# Patient Record
Sex: Male | Born: 1935 | Race: White | Hispanic: No | Smoking: Former smoker
Health system: Southern US, Community
[De-identification: ages and names within clinical notes are randomized; demographics above are authoritative.]

## PROBLEM LIST (undated history)

## (undated) DIAGNOSIS — I4891 Unspecified atrial fibrillation: Secondary | ICD-10-CM

## (undated) DIAGNOSIS — I350 Nonrheumatic aortic (valve) stenosis: Secondary | ICD-10-CM

## (undated) DIAGNOSIS — I495 Sick sinus syndrome: Secondary | ICD-10-CM

## (undated) DIAGNOSIS — R251 Tremor, unspecified: Secondary | ICD-10-CM

## (undated) DIAGNOSIS — I509 Heart failure, unspecified: Secondary | ICD-10-CM

## (undated) DIAGNOSIS — Z950 Presence of cardiac pacemaker: Secondary | ICD-10-CM

## (undated) DIAGNOSIS — Z952 Presence of prosthetic heart valve: Secondary | ICD-10-CM

## (undated) DIAGNOSIS — E119 Type 2 diabetes mellitus without complications: Secondary | ICD-10-CM

## (undated) DIAGNOSIS — I10 Essential (primary) hypertension: Secondary | ICD-10-CM

## (undated) DIAGNOSIS — C801 Malignant (primary) neoplasm, unspecified: Secondary | ICD-10-CM

## (undated) HISTORY — PX: COLONOSCOPY: SHX174

## (undated) HISTORY — DX: Unspecified atrial fibrillation: I48.91

## (undated) HISTORY — DX: Sick sinus syndrome: I49.5

## (undated) HISTORY — PX: HERNIA REPAIR: SHX51

## (undated) HISTORY — PX: INSERT / REPLACE / REMOVE PACEMAKER: SUR710

## (undated) HISTORY — DX: Nonrheumatic aortic (valve) stenosis: I35.0

## (undated) HISTORY — PX: TONSILLECTOMY: SUR1361

---

## 1991-07-11 HISTORY — PX: PACEMAKER INSERTION: SHX728

## 1999-11-04 ENCOUNTER — Ambulatory Visit (HOSPITAL_COMMUNITY): Admission: RE | Admit: 1999-11-04 | Discharge: 1999-11-04 | Payer: Self-pay | Admitting: Gastroenterology

## 2004-11-07 HISTORY — PX: BIV PACEMAKER GENERATOR CHANGE OUT: SHX5746

## 2004-11-21 ENCOUNTER — Ambulatory Visit (HOSPITAL_COMMUNITY): Admission: AD | Admit: 2004-11-21 | Discharge: 2004-11-22 | Payer: Self-pay | Admitting: Cardiovascular Disease

## 2008-02-18 ENCOUNTER — Ambulatory Visit (HOSPITAL_COMMUNITY): Admission: RE | Admit: 2008-02-18 | Discharge: 2008-02-18 | Payer: Self-pay | Admitting: Surgery

## 2008-06-10 ENCOUNTER — Encounter: Admission: RE | Admit: 2008-06-10 | Discharge: 2008-06-10 | Payer: Self-pay | Admitting: Cardiovascular Disease

## 2008-06-23 ENCOUNTER — Encounter: Admission: RE | Admit: 2008-06-23 | Discharge: 2008-06-23 | Payer: Self-pay | Admitting: Cardiovascular Disease

## 2008-07-21 ENCOUNTER — Encounter: Admission: RE | Admit: 2008-07-21 | Discharge: 2008-07-21 | Payer: Self-pay | Admitting: Cardiovascular Disease

## 2008-11-20 ENCOUNTER — Encounter: Admission: RE | Admit: 2008-11-20 | Discharge: 2008-11-20 | Payer: Self-pay | Admitting: Gastroenterology

## 2009-10-01 ENCOUNTER — Encounter: Admission: RE | Admit: 2009-10-01 | Discharge: 2009-10-01 | Payer: Self-pay | Admitting: Gastroenterology

## 2010-11-22 NOTE — Op Note (Signed)
NAME:  [name] [name], [name] [name] NO.:  [id number]   MEDICAL RECORD NO.:  [id number]          PATIENT TYPE:  AMB   LOCATION:  DAY                          FACILITY:  [facility]   PHYSICIAN:  Adin Hector, MD     DATE OF BIRTH:  10-16-1926   DATE OF PROCEDURE:  DATE OF DISCHARGE:                               OPERATIVE REPORT   PRIMARY CARE PHYSICIAN:  Dr. Delena Bali in [place], [place].   CARDIOLOGIST:  Richard A. Rollene Fare, M.D.   SURGEON:  Adin Hector, MD.   PREOPERATIVE DIAGNOSES:  1. Incarcerated left inguinal hernia.  2. Possible right inguinal hernia.   POSTOPERATIVE DIAGNOSES:  1. Incarcerated indirect inguinal on the left.  2. Right indirect inguinal hernia.  3. Bilateral femoral hernias.   PROCEDURES PERFORMED:  1. Laparoscopic preperitoneal lysis of adhesions x1 hour.  2. Laparoscopic reduction and repair of left inguinal and femoral      hernias.  3. Laparoscopic repair of right inguinal and femoral hernias.   ANESTHESIA:  1. General anesthesia.  2. Local anesthetic in a field block around all port sites.  3. Bilateral ilioinguinal/genitofemoral/cord nerve blocks.   SPECIMENS:  None.   DRAINS:  None.   ESTIMATED BLOOD LOSS:  50 mL.   COMPLICATIONS:  No major complications.   INDICATIONS:  Mr. Kunselman is a 77 year old male who has a history of an  open left femoral hernia back in 1998.  He has had increasing pain and  swelling in his left groin.  He had a large hernia and that has gotten  stuck down in his scrotum and has not been able to be reduced.  He is  rather physically active and in relatively decent health except for  chronic anticoagulation for rapid heart rate and atrial fibrillation.   Pathophysiology of inguinal herniation versus the risks of strangulation  and further incarceration were discussed.  Options discussed and  recommendations made for laparoscopic bilateral preperitoneal  exploration with repair of left and possible  right inguinal hernias.  Possibly need for conversion to open and other risks were discussed.  Risks, benefits, and alternatives discussed.  Questions answered and he  agreed to proceed.   OPERATIVE FINDINGS:  He had a large incarcerated left inguinal hernia  including a sigmoid colon going down into his scrotum.  He had a small  indirect inguinal hernia.  He had bilateral femoral hernias as well.  They were not incarcerated.   DESCRIPTION OF PROCEDURE:  Informed consent was confirmed.  He received  IV antibiotics just prior to surgery.  He had sequential compression  devices active during the entire case.  He was positioned supine with  both arms tucked.  He underwent general esthesia without any difficulty.  He had a Foley catheter sterilely placed.  His abdomen and scrotum were  clipped, prepped and draped in a sterile fashion.   Entry was gained in the preperitoneal space through an infraumbilical  curvilinear incision.  A nick was made in the anterior rectus fascia  just to the right of midline a sound port was placed in the  preperitoneal plane posterior to the right rectus abdominis muscle.   Capnopreperitoneum to 15 mmHg provided good entry and abdominal wall  insufflation.  Camera dissection was used to free the peritoneum off of  the bilateral lower quadrants and flanks.  Enough work space was created  such that a 5 mm port could be placed in the right midabdomen and left  midabdomen.   Attention was turned to the left side, the more obvious side.  I was  able to partially reduced the hernia after given general anesthesia but  it was unable to be complete reduced.  Careful dissection was done to  free the perineum from lateral sidewalls.  The anterolateral left side  of the bladder was freed off the true pelvic wall.  This helped define  the inguinal region.  Peritoneum could see crawling into a very large  internal ring consistent with an incarcerated indirect hernia.   Careful  dissection done was using blunt as well as sharp dissection.  I was able  to partially reduce some of the sac but was unable to completely reduce  it.  I therefore opened the sac anteriorly where it was thin.  He had a  sigmoid going obviously up into the region.  I ended up doing sharp  dissection of the sigmoid colon off the hernia sac and gradually freed  off the anterior three quarters of the sac around circumferentially and  internal ring.  Eventually, I was able to manually help reduce in the  hernia after partially reducing the sigmoid in until it was fully in.  Sharp dissection was done to free the colon off the hernia sac and  reduce it back into the intra-abdominal space.  The peritoneal sac was  peeled off the intact spermatic vessels and vas deferens as proximally  as possible.  Defect in the perineum was closed using a 3-0 Vicryl  stitch using a running stitch and using laparoscopic intracoronal  suturing to good effect.  There was some oozing, good hemostasis was  assured.  Copious irrigation was done with the more clear return.   On inspection, I also could see a femoral hernia but it was not  incarcerated.  It was much smaller.  There was no definite direct  defect, although there was some laxity in this region.   Attention was turned toward the right side.  Dissection was carried out  in a mirror image fashion.  He had a definite hernia sac on the right  side, but was much smaller and was able to be more easily reduced.   A 15 x 15 cm ultra lightweight polypropylene (Ultrapro) mesh was used  for each side.  Each mesh was into a half-skull shape.  It was  positioned such that a medial and inferior flap rested in the true  pelvis between the anterolateral bladder and anteromedial pelvic wall  covering around the level of the obturator foramen.  Mesh covered well.  The internal ring and femoral rings as well as any potential direct  weaknesses as well.  There was  at least 3 inches of circumferential  coverage around these regions.  The hernia sacs were grasped on both  sides and elevated anteriorly and proximally as capnopreperitoneum was  evacuated.  Ports were removed.  I made an opening in the peritoneum  infraumbilically and evacuated some excess gas.  The infraumbilical  fascial was closed using 0 Vicryl stitch.  Skin was closed using 4-0  Monocryl stitch.  Sterile dressings  applied.  The patient was extubated  and taken to the recovery room in stable condition.   I discussed postoperative recovery with the patient just prior to  surgery and confirmed it with his family.  He is to go follow-up with  Dr. Delena Bali in 2 days after restarting Coumadin for INR checks to  gradually re-anticoagulate himself on the oral Coumadin only.  There was  some concern about possibly considered using Lovenox as a bridge but Dr.  Delena Bali, at this time, felt it would be reasonable to just do Coumadin  at this time and if there is any confusion, he will consult with Dr.  Rollene Fare as well.      Adin Hector, MD  Electronically Signed     SCG/MEDQ  D:  02/18/2008  T:  02/18/2008  Job:  [id number]   cc:   Dr. Daun Peacock, [place]   Richard A. Rollene Fare, M.D.  Fax: [phone]

## 2010-11-25 NOTE — Procedures (Signed)
[facility]  Patient:    [name] [name], [name] [name]                       MRN: [id number] Proc. Date: 11/04/99 Adm. Date:  [id number] Disc. Date: [id number] Attending:  Harriette Bouillon CC:         Richard A. Rollene Fare, M.D.             Christy Sartorius, M.D. [facility]             Dr.Douglas Daun Peacock [place]                           Procedure Report  PROCEDURE PERFORMED:  Panendoscopy.  ENDOSCOPIST:  Mayme Genta, M.D.  INDICATIONS:  The patient is a 77 year old white male undergoing endoscopy to assess esophageal varices, portal hypertensive gastropathy, other potential etiologies of iron deficiency anemia.  Known chronic liver disease.  DESCRIPTION OF PROCEDURE:  After reviewing the nature of the procedure with the  patient including potential risks and complications and after discussing alternative methods of diagnosis and treatment, informed consent was signed.  The patient was premedicated receiving Versed 4 mg, fentanyl 50 mcg administered IV in divided doses prior to and during the course of the procedure.  Using an Olympus video endoscope, proximal esophagus intubated under direct vision. Normal oropharynx.  No lesion of the epiglottis, vocal cords or piriform sinuses. The proximal and midesophagus were normal.  Distal esophagus notable for grade  varices.  Hiatal hernia from 38 to 41 cm with mild inflammation characterized by mucosal erythema, on the gastric side of the esophagogastric junction. No stricture, no erosion or ulceration noted.  The gastric fundus, body and antrum characterized by erythematous mucosa consistent with a mild chronic gastritis.  Could not exclude possible portal hypertensive gastropathy.  The process appeared to be confluent throughout the stomach without preference for a given segment.  Pylorus symmetric, easily traversed.  Duodenal bulb and second portion were normal. Retroflex view of the angularis, lesser  curve, gastric cardia and fundus confirmed the above findings with no additional lesion being noted.  After photodocumentation, the stomach was decompressed and the scope withdrawn.  The patient tolerated the procedure without difficulty being maintained on Datascope monitor and low-flow oxygen throughout.  ASSESSMENT: 1. Grade 1 esophageal varices--no stigmata of recent hemorrhage. 2. Hiatal hernia--small, mild inflammation on the gastric side of the  esophagogastric junction. 3. Gastritis--mild.  Possible portal hypertensive gastropathy.  RECOMMENDATIONS: 1. Discontinue Captopril and add beta blocker.  This will need to be cleared    with Dr. Terance Ice. 2. Initiate ____________ 40 mg p.o. q.a.m. 3. Check hemoccults in approximately one month.  If positive, consider colonoscopy    to further evaluate iron deficiency anemia.  DD:  11/04/99 TD:  11/07/99 Job: [id number]

## 2010-11-25 NOTE — Op Note (Signed)
NAMESHADE, MCCALIP                ACCOUNT NO.:  [id number]   MEDICAL RECORD NO.:  [id number]          PATIENT TYPE:  OIB   LOCATION:  2899                         FACILITY:  [place]   PHYSICIAN:  Richard A. Rollene Fare, M.D.DATE OF BIRTH:  [birth year]   DATE OF PROCEDURE:  11/21/2004  DATE OF DISCHARGE:                                 OPERATIVE REPORT   PROCEDURE:  1. Explantation of expired end-of-life pulse generator pacesetter      Synchrony 2, #2022L; SN: [id number] (DOI May 25, 1992).  2. Implantation of new [employer] Verity ADX-XLSR model [id number]; SN: [id number]      pulse generator.  3. Pacemaker pocket revision, utilization of previously implanted bipolar      ventricular electrode #1216T; [id number].  4. Capping of previously implanted bipolar pacesetter, atrial electrode,      model #1222T; SN: [id number].     IMPLANTING PHYSICIAN:  Richard A. Rollene Fare, M.D.   COMPLICATIONS:  None.   ESTIMATED BLOOD LOSS:  Approximately 15 mL.   ANESTHESIA:  5 mg Valium p.o. premedication, 1 mg Versed, 25 mcg fentanyl IV  for sedation, 1% local Xylocaine.   PREOPERATIVE ANTIBIOTIC PROPHYLAXIS:  Ancef 1 g IV with planned  postoperative Ancef IV.   COMPLICATIONS:  None.   PREOPERATIVE DIAGNOSIS:  1. Sick sinus syndrome--symptomatic bradycardia.  2. Permanent transvenous pacemaker implanted May 25, 1992.  3. Systemic hypertension.  4. Remote history of quinidine intolerance.  5. History of amiodarone-induced hepatitis, resolved, off medication.  6. Remote transient ischemic attack, early 1990s, no recurrence.  7. No symptoms of coronary disease.  No congestive heart failure.      Negative Cardiolite February, 2000.     POSTOPERATIVE DIAGNOSES:  1. Sick sinus syndrome--symptomatic bradycardia.  2. Permanent transvenous pacemaker implanted May 25, 1992.  3. Systemic hypertension.  4. Remote history of quinidine intolerance.  5. History of amiodarone-induced hepatitis,  resolved, off medication.  6. Remote transient ischemic attack early 1990s, no recurrence.  7. No symptoms of coronary disease.  No congestive heart failure.      Negative Cardiolite February, 2000.     PROCEDURE:  The patient was brought to the second floor CP lab in a  postabsorptive state after 5 mg of Valium, p.o. premedication.  Coumadin was  on hold, and INR on the day of the procedure was 1.5.  Informed consent was  obtained to proceed with EOL generator change, pocket revision and new  ventricular lead if necessary.  The patient has chronic atrial fibrillation-  -permanent.  Is not pacemaker dependent and has reached EOL.  Battery  voltage 2.37, 19 kilohm battery impedance.  Ventricular lead impedance 449  ohms and unipolar threshold 1 v at 0.6 milliseconds preoperatively.  He was  brought to the second floor CP lab in a postabsorptive state, premedicated  with 5 mg of Valium p.o. and given 1 g of Ancef IV.  The left anterior chest  was prepped, draped in the usual manner.  1% Xylocaine was used for local  anesthesia.  The pulse generator lead was visualized fluoroscopically, and  no insulation or  conductor defects were visualized on either electrode.  A  left infraclavicular transverse incision was performed just below the  previously placed incision.  This was brought down through the subcutaneous  tissue to the anterior fibrous capsule using blunt dissection.  The fibrous  capsule was incised and opened.  There was some minor thickening and  calcification of friability of the anterior fibrous capsule, so this was  resected.  It does have a slight difference in new generator size.  The  pocket was revised using blunt dissection.  Electrocautery was used to  control hemostasis, and anterior fibrous capsule fragments and prior  retraining sutures were removed and debrided.  Threshold testing was  performed, unipolar and bipolar. There were adequate unipolar thresholds.  There  was increased resistance bipolar with moderately high ventricular  threshold suggesting the possibility of outer conductor and old coil  problems but no problems with the unipolar inner coil.  It was elected since  the patient is not pacer dependent and had excellent unipolar measurements  and thresholds that the same lead be utilized in this setting.  The pocket  was irrigated with 500 mg of kanamycin solution.  The atrial electrode was  capped with a silicone cap and tied with two, #1 silk sutures.  The  generator was hooked to the ventricular electrode with a single hex not  tightened.  It was delivered into the pocket with the electrode, and capped  lead looped behind.  The sponge count was correct.  The subcutaneous tissue  was closed with two separate running layers of Vicryl suture, and the skin  was closed with 4-0 subcuticular Vicryl suture.  Steri-Strips were applied.  Fluoroscopy showed good position of the atrial and ventricular electrodes  and the pulse generator.  A bulky pressure dressing was applied, and the  patient was transferred to the holding area for postoperative programming in  stable condition.  He tolerated the procedure well, and we plan to start  Coumadin back tonight if the wound looks good.   THRESHOLDS:  Atrial bipolar:  Fibrillatory waves measure 0.8 to 1.1 mV,  resistance 538 ohms.   Bipolar R wave:  4.8 to 5.1 mV; resistance 13.49 ohms; threshold 2.6 v at  0.6 ms.   Unipolar ventricular threshold:  R = 6.2 to 8.1 mV; resistance 387 ohms,  threshold 0.7 v at 0.5 ms.   Magnet rate at BOL = 98.5.  At EOL magnet rate drops to 86.3.      RAW/MEDQ  D:  11/21/2004  T:  11/21/2004  Job:  [id number]   cc:   CP Lab   Nelda Bucks, M.D.  [place], [place]

## 2010-11-25 NOTE — Discharge Summary (Signed)
NAME:  [name] [name], KOPE                ACCOUNT NO.:  [id number]   MEDICAL RECORD NO.:  [id number]          PATIENT TYPE:  OIB   LOCATION:  3705                         FACILITY:  [facility]   PHYSICIAN:  Richard A. Rollene Fare, M.D.DATE OF BIRTH:  Feb 14, 1927   DATE OF ADMISSION:  11/21/2004  DATE OF DISCHARGE:  11/22/2004                                 DISCHARGE SUMMARY   DISCHARGE DIAGNOSES:  1. End of life generator on permanent pacemaker.      a. Generator change with explant of old generator Synchrony II and         implantation of Verity ADX XLSR pulse generator by [employer],         serial [phone], model [phone], placed on Nov 21, 2004.  2. Chronic atrial fibrillation.  3. Remote transient ischemic attack.  4. Amiodarone-induced hepatitis.  5. Small nodular density at the inferior heart border of his lungs.     DISCHARGE CONDITION:  Stable.   DISCHARGE MEDICATIONS:  1. Coumadin 6 mg daily as previously.  Dr. Delena Bali to follow.  2. Diltiazem 180 mg once daily.  3. Toprol XL 100 mg daily.  4. Lanoxin 0.125 mg daily.  5. Norvasc 5 mg daily.  6. Hold vitamin E for one week.  7. Clorazepate 3.75 mg as needed.     ALLERGIES:  1. PACERONE.  2. AMIODARONE.     FAMILY HISTORY:  See H&P.   SOCIAL HISTORY:  See H&P.   REVIEW OF SYSTEMS:  See H&P.   PHYSICAL EXAMINATION AT DISCHARGE:  VITAL SIGNS:  Blood pressure 128/78,  pulse 66, respirations 14, temperature 97.9 degrees.  GENERAL APPEARANCE:  An alert and oriented male in no acute distress.  CHEST:  Pacer site with no bruising, no hematoma, no drainage and a well-  approximated incision.  HEART:  Heart sounds S1 and S2 regular rate and rhythm.  LUNGS:  Clear without rales, rhonchi or wheezes.  EXTREMITIES:  The right forearm has some swelling and tenderness, but no  erythema.  Lower extremities without edema.   LABORATORY VALUES:  Hemoglobin 14.6 (this is post procedure), WBC 8.7,  hematocrit 43.4 and platelets  144.  Sodium 140, potassium 3.8, chloride 104,  CO2 27, glucose 93, BUN 19, creatinine 1.3, calcium 8.8.   Chest x-ray showed:  1.  A small nodular density at the inferior left heart  border.  This does not appear to represent the left nipple shadow.  This  could represent a small mass or area of scarring.  If the long-term  stability of this cannot be established with previous chest x-rays, chest CT  would be recommended.  2.  Mild changes of COPD and chronic bronchitis.  To  be followed as an outpatient.   Prior to procedure, pro time 13, INR 1.5 and PTT 32.  Coumadin was restarted  the night of procedure.   HOSPITAL COURSE:  Mr. Sliman was brought in as an outpatient on Nov 21, 2004, for explantation of old generator and implantation of new generator  secondary to end of life characteristics.  He has  a history of chronic  atrial fibrillation and is on Coumadin followed by Dr. Delena Bali.   The patient also has hypertension.  He was brought in and underwent  generator change out.  He tolerated the procedure well.  By the morning of  Nov 22, 2004, he was ambulating without complaints.  The site looked well  approximated without hematoma and no bleeding.   Dr. Rex Kras saw the patient and felt he was ready for discharge.  His chest x-  ray showed no pneumothorax, but please note there is a small nodule that  will need to be followed as an outpatient and Dr. Rollene Fare will address  that on the office visit.      LRI/MEDQ  D:  11/22/2004  T:  11/22/2004  Job:  [id number]   cc:   Nelda Bucks, M.D.

## 2011-04-07 LAB — BASIC METABOLIC PANEL
BUN: 20
Calcium: 8.9
Chloride: 106
GFR calc Af Amer: >60
GFR calc non Af Amer: 54 — ABNORMAL LOW
Glucose, Bld: 94
Potassium: 4.2

## 2011-04-07 LAB — PROTIME-INR: Prothrombin Time: 15.3 — ABNORMAL HIGH

## 2011-04-07 LAB — HEMOGLOBIN AND HEMATOCRIT, BLOOD: Hemoglobin: 15.9

## 2012-08-20 ENCOUNTER — Encounter: Payer: Self-pay | Admitting: Pharmacist Clinician (PhC)/ Clinical Pharmacy Specialist

## 2012-09-24 ENCOUNTER — Other Ambulatory Visit (HOSPITAL_COMMUNITY): Payer: Self-pay | Admitting: Cardiovascular Disease

## 2012-09-24 DIAGNOSIS — I350 Nonrheumatic aortic (valve) stenosis: Secondary | ICD-10-CM

## 2012-09-24 DIAGNOSIS — I4891 Unspecified atrial fibrillation: Secondary | ICD-10-CM

## 2012-09-30 ENCOUNTER — Ambulatory Visit (HOSPITAL_COMMUNITY)
Admission: RE | Admit: 2012-09-30 | Discharge: 2012-09-30 | Disposition: A | Discharge status: Home / Self Care | Payer: Medicare Other | Source: Ambulatory Visit | Attending: Cardiovascular Disease | Admitting: Cardiovascular Disease

## 2012-09-30 DIAGNOSIS — R0602 Shortness of breath: Secondary | ICD-10-CM

## 2012-09-30 DIAGNOSIS — I517 Cardiomegaly: Secondary | ICD-10-CM | POA: Insufficient documentation

## 2012-09-30 DIAGNOSIS — I359 Nonrheumatic aortic valve disorder, unspecified: Secondary | ICD-10-CM | POA: Insufficient documentation

## 2012-09-30 DIAGNOSIS — I4891 Unspecified atrial fibrillation: Secondary | ICD-10-CM | POA: Insufficient documentation

## 2012-09-30 DIAGNOSIS — I079 Rheumatic tricuspid valve disease, unspecified: Secondary | ICD-10-CM | POA: Insufficient documentation

## 2012-09-30 DIAGNOSIS — I350 Nonrheumatic aortic (valve) stenosis: Secondary | ICD-10-CM

## 2012-09-30 DIAGNOSIS — I270 Primary pulmonary hypertension: Secondary | ICD-10-CM | POA: Insufficient documentation

## 2012-09-30 DIAGNOSIS — I059 Rheumatic mitral valve disease, unspecified: Secondary | ICD-10-CM | POA: Insufficient documentation

## 2012-09-30 DIAGNOSIS — I379 Nonrheumatic pulmonary valve disorder, unspecified: Secondary | ICD-10-CM | POA: Insufficient documentation

## 2012-09-30 NOTE — Progress Notes (Signed)
2D Echo Performed 09/30/2012    [name] [name], RCS

## 2013-04-03 ENCOUNTER — Encounter: Payer: Self-pay | Admitting: Cardiovascular Disease

## 2013-06-29 ENCOUNTER — Encounter (HOSPITAL_COMMUNITY): Payer: Self-pay | Admitting: Emergency Medicine

## 2013-06-29 ENCOUNTER — Emergency Department (HOSPITAL_COMMUNITY): Payer: Medicare Other

## 2013-06-29 ENCOUNTER — Inpatient Hospital Stay (HOSPITAL_COMMUNITY)
Admission: EM | Admit: 2013-06-29 | Discharge: 2013-07-08 | DRG: 242 | Disposition: A | Discharge status: Home Health | Payer: Medicare Other | Attending: Cardiovascular Disease | Admitting: Cardiovascular Disease

## 2013-06-29 DIAGNOSIS — Z7901 Long term (current) use of anticoagulants: Secondary | ICD-10-CM

## 2013-06-29 DIAGNOSIS — I129 Hypertensive chronic kidney disease with stage 1 through stage 4 chronic kidney disease, or unspecified chronic kidney disease: Secondary | ICD-10-CM | POA: Diagnosis present

## 2013-06-29 DIAGNOSIS — I4891 Unspecified atrial fibrillation: Secondary | ICD-10-CM

## 2013-06-29 DIAGNOSIS — I359 Nonrheumatic aortic valve disorder, unspecified: Secondary | ICD-10-CM | POA: Diagnosis present

## 2013-06-29 DIAGNOSIS — I5043 Acute on chronic combined systolic (congestive) and diastolic (congestive) heart failure: Secondary | ICD-10-CM | POA: Diagnosis present

## 2013-06-29 DIAGNOSIS — G20A1 Parkinson's disease without dyskinesia, without mention of fluctuations: Secondary | ICD-10-CM | POA: Diagnosis present

## 2013-06-29 DIAGNOSIS — I5023 Acute on chronic systolic (congestive) heart failure: Secondary | ICD-10-CM

## 2013-06-29 DIAGNOSIS — I428 Other cardiomyopathies: Secondary | ICD-10-CM | POA: Diagnosis present

## 2013-06-29 DIAGNOSIS — I350 Nonrheumatic aortic (valve) stenosis: Secondary | ICD-10-CM | POA: Diagnosis present

## 2013-06-29 DIAGNOSIS — Z950 Presence of cardiac pacemaker: Secondary | ICD-10-CM

## 2013-06-29 DIAGNOSIS — I509 Heart failure, unspecified: Secondary | ICD-10-CM | POA: Diagnosis present

## 2013-06-29 DIAGNOSIS — G20 Parkinson's disease: Secondary | ICD-10-CM | POA: Diagnosis present

## 2013-06-29 DIAGNOSIS — E119 Type 2 diabetes mellitus without complications: Secondary | ICD-10-CM | POA: Diagnosis present

## 2013-06-29 DIAGNOSIS — I4821 Permanent atrial fibrillation: Secondary | ICD-10-CM

## 2013-06-29 DIAGNOSIS — Z87891 Personal history of nicotine dependence: Secondary | ICD-10-CM

## 2013-06-29 DIAGNOSIS — I447 Left bundle-branch block, unspecified: Secondary | ICD-10-CM | POA: Diagnosis present

## 2013-06-29 DIAGNOSIS — I429 Cardiomyopathy, unspecified: Secondary | ICD-10-CM | POA: Diagnosis present

## 2013-06-29 DIAGNOSIS — N183 Chronic kidney disease, stage 3 unspecified: Secondary | ICD-10-CM | POA: Diagnosis present

## 2013-06-29 DIAGNOSIS — I2510 Atherosclerotic heart disease of native coronary artery without angina pectoris: Secondary | ICD-10-CM | POA: Diagnosis present

## 2013-06-29 DIAGNOSIS — Z79899 Other long term (current) drug therapy: Secondary | ICD-10-CM

## 2013-06-29 DIAGNOSIS — I959 Hypotension, unspecified: Secondary | ICD-10-CM | POA: Diagnosis present

## 2013-06-29 HISTORY — DX: Long term (current) use of anticoagulants: Z79.01

## 2013-06-29 HISTORY — DX: Acute on chronic combined systolic (congestive) and diastolic (congestive) heart failure: I50.43

## 2013-06-29 HISTORY — DX: Type 2 diabetes mellitus without complications: E11.9

## 2013-06-29 HISTORY — DX: Presence of cardiac pacemaker: Z95.0

## 2013-06-29 HISTORY — DX: Permanent atrial fibrillation: I48.21

## 2013-06-29 HISTORY — DX: Nonrheumatic aortic (valve) stenosis: I35.0

## 2013-06-29 LAB — CBC WITH DIFFERENTIAL/PLATELET
Basophils Relative: 0 % (ref 0–1)
Hemoglobin: 14.1 g/dL (ref 13.0–17.0)
MCHC: 32.9 g/dL (ref 30.0–36.0)
MCV: 94.7 fL (ref 78.0–100.0)
Monocytes Absolute: 1.3 10*3/uL — ABNORMAL HIGH (ref 0.1–1.0)
Monocytes Relative: 8 % (ref 3–12)
Neutro Abs: 14.1 10*3/uL — ABNORMAL HIGH (ref 1.7–7.7)
Neutrophils Relative %: 86 % — ABNORMAL HIGH (ref 43–77)
RBC: 4.52 MIL/uL (ref 4.22–5.81)
WBC: 16.3 10*3/uL — ABNORMAL HIGH (ref 4.0–10.5)

## 2013-06-29 LAB — PROTIME-INR: INR: 7.97 (ref 0.00–1.49)

## 2013-06-29 LAB — COMPREHENSIVE METABOLIC PANEL
ALT: 20 U/L (ref 0–53)
Albumin: 3 g/dL — ABNORMAL LOW (ref 3.5–5.2)
BUN: 52 mg/dL — ABNORMAL HIGH (ref 6–23)
CO2: 25 mEq/L (ref 19–32)
Calcium: 8.6 mg/dL (ref 8.4–10.5)
Creatinine, Ser: 1.52 mg/dL — ABNORMAL HIGH (ref 0.50–1.35)
GFR calc Af Amer: 47 mL/min — ABNORMAL LOW (ref >90)
GFR calc non Af Amer: 40 mL/min — ABNORMAL LOW (ref >90)
Glucose, Bld: 156 mg/dL — ABNORMAL HIGH (ref 70–99)
Potassium: 4.6 mEq/L (ref 3.5–5.1)

## 2013-06-29 LAB — TROPONIN I: Troponin I: <0.3 ng/mL (ref <0.30)

## 2013-06-29 LAB — DIGOXIN LEVEL: Digoxin Level: 1.1 ng/mL (ref 0.8–2.0)

## 2013-06-29 LAB — POCT I-STAT TROPONIN I

## 2013-06-29 LAB — APTT: aPTT: 70 seconds — ABNORMAL HIGH (ref 24–37)

## 2013-06-29 MED ORDER — DIGOXIN 0.25 MG/ML IJ SOLN
0.2500 mg | Freq: Once | INTRAMUSCULAR | Status: AC
  Administered 2013-06-29: 0.25 mg via INTRAVENOUS
  Filled 2013-06-29: qty 2

## 2013-06-29 MED ORDER — WARFARIN - PHARMACIST DOSING INPATIENT: Freq: Every day | Status: DC

## 2013-06-29 MED ORDER — INSULIN ASPART 100 UNIT/ML ~~LOC~~ SOLN
0.0000 [IU] | Freq: Three times a day (TID) | SUBCUTANEOUS | Status: DC
  Administered 2013-06-30: 1 [IU] via SUBCUTANEOUS
  Administered 2013-07-01 (×2): 2 [IU] via SUBCUTANEOUS
  Administered 2013-07-02: 1 [IU] via SUBCUTANEOUS
  Administered 2013-07-02 – 2013-07-03 (×4): 2 [IU] via SUBCUTANEOUS
  Administered 2013-07-05 – 2013-07-06 (×2): 1 [IU] via SUBCUTANEOUS
  Administered 2013-07-06: 2 [IU] via SUBCUTANEOUS
  Administered 2013-07-07: 1 [IU] via SUBCUTANEOUS

## 2013-06-29 MED ORDER — ACETAMINOPHEN 325 MG PO TABS
650.0000 mg | ORAL_TABLET | ORAL | Status: DC | PRN
  Administered 2013-07-04 – 2013-07-06 (×3): 650 mg via ORAL
  Filled 2013-06-29 (×3): qty 2

## 2013-06-29 MED ORDER — DEXTROSE 5 % IV SOLN
5.0000 mg/h | INTRAVENOUS | Status: DC
  Administered 2013-06-30: 10 mg/h via INTRAVENOUS
  Administered 2013-07-01 (×2): 5 mg/h via INTRAVENOUS
  Administered 2013-07-02: 10 mg/h via INTRAVENOUS
  Administered 2013-07-02: 7.5 mg/h via INTRAVENOUS
  Administered 2013-07-03 – 2013-07-04 (×3): 10 mg/h via INTRAVENOUS
  Filled 2013-06-29 (×9): qty 100

## 2013-06-29 MED ORDER — METOPROLOL SUCCINATE ER 50 MG PO TB24
50.0000 mg | ORAL_TABLET | Freq: Every day | ORAL | Status: DC
  Administered 2013-06-30 – 2013-07-08 (×9): 50 mg via ORAL
  Filled 2013-06-29 (×9): qty 1

## 2013-06-29 MED ORDER — DIGOXIN 125 MCG PO TABS
0.1250 mg | ORAL_TABLET | Freq: Every day | ORAL | Status: DC
  Administered 2013-06-30 – 2013-07-08 (×9): 0.125 mg via ORAL
  Filled 2013-06-29 (×9): qty 1

## 2013-06-29 MED ORDER — METOPROLOL TARTRATE 1 MG/ML IV SOLN: 5.0000 mg | Freq: Four times a day (QID) | INTRAVENOUS | Status: DC | PRN

## 2013-06-29 MED ORDER — CARBIDOPA-LEVODOPA 25-100 MG PO TABS
1.0000 | ORAL_TABLET | Freq: Three times a day (TID) | ORAL | Status: DC
  Administered 2013-06-30 – 2013-07-08 (×25): 1 via ORAL
  Filled 2013-06-29 (×31): qty 1

## 2013-06-29 MED ORDER — DILTIAZEM HCL 100 MG IV SOLR
5.0000 mg/h | INTRAVENOUS | Status: DC
  Administered 2013-06-29: 5 mg/h via INTRAVENOUS

## 2013-06-29 MED ORDER — DILTIAZEM HCL 25 MG/5ML IV SOLN
20.0000 mg | Freq: Once | INTRAVENOUS | Status: AC
  Administered 2013-06-29: 10 mg via INTRAVENOUS
  Filled 2013-06-29: qty 5

## 2013-06-29 MED ORDER — FUROSEMIDE 10 MG/ML IJ SOLN
80.0000 mg | Freq: Two times a day (BID) | INTRAMUSCULAR | Status: DC
  Administered 2013-06-30: 40 mg via INTRAVENOUS
  Filled 2013-06-29 (×4): qty 8

## 2013-06-29 NOTE — ED Notes (Signed)
Dr. Tilley at bedside. 

## 2013-06-29 NOTE — ED Provider Notes (Signed)
CSN: [id number]     Arrival date & time 06/29/13  1740 History   First MD Initiated Contact with Patient 06/29/13 1745     Chief Complaint  Patient presents with  . Shortness of Breath   HPI  [name] [name] is a 79 y/o male with history as noted below who presents with cc of shortness of breath. Symptoms began approximately 1 month ago and has gradually worsened. He saw his PCP twice and was treated for "bronchitis" with inhalers and antibiotics. He states despite this treatment his SOB has gradually worsened. He has associated LE edema and weeping. He denies any chest pain. His cough has resolved. He denies any fevers, nausea, and vomiting.   Past Medical History  Diagnosis Date  . Atrial fibrillation     echo- EF 35-40%; LA severely dilated; RA mod dilated, RV systolic pressure 0000000; LV systolic fcn mod reduced  . Atrial fibrillation     stress test - post stress EF 28%, AFib w/ RVR and LBBB, apical and septal thinning; increased RV free wall uptake consistent w/ pressure and/or volume overload  . Aortic valvular stenoses     not symtomatic  . SSS (sick sinus syndrome)     pacemaker placed in 1993  . Diabetes mellitus without complication    Past Surgical History  Procedure Laterality Date  . Pacemaker insertion  1993  . Biv pacemaker generator change out  11/2004  . Hernia repair     History reviewed. No pertinent family history. History  Substance Use Topics  . Smoking status: Former Smoker    Quit date: 07/09/1969  . Smokeless tobacco: Never Used  . Alcohol Use: No    Review of Systems  Constitutional: Negative for fever and chills.  Respiratory: Positive for shortness of breath. Negative for cough.   Cardiovascular: Positive for leg swelling. Negative for chest pain.  Gastrointestinal: Negative for nausea and vomiting.  Genitourinary: Negative for dysuria and frequency.  Skin: Negative for rash.  All other systems reviewed and are negative.   Allergies   Amiodarone  Home Medications   No current outpatient prescriptions on file. BP 100/81  Pulse 112  Temp(Src) 98.6 F (37 C) (Oral)  Resp 27  Ht 5\' 9"  (1.753 m)  Wt 165 lb 5.5 oz (75 kg)  BMI 24.41 kg/m2  SpO2 91% Physical Exam  Constitutional: He is oriented to person, place, and time. He appears well-developed and well-nourished. No distress.  HENT:  Head: Normocephalic and atraumatic.  Mouth/Throat: No oropharyngeal exudate.  Eyes: Conjunctivae are normal. Pupils are equal, round, and reactive to light.  Neck: Normal range of motion. Neck supple.  Cardiovascular: Normal heart sounds.  An irregularly irregular rhythm present. Tachycardia present.  Exam reveals no gallop and no friction rub.   No murmur heard. Pulmonary/Chest: Breath sounds normal. Tachypnea (mild) noted.  Abdominal: Soft. He exhibits no distension. There is no tenderness.  Musculoskeletal: Normal range of motion. He exhibits no tenderness. Edema: +2 LE pitting edema. Weaping sores without signs of infection.   Neurological: He is alert and oriented to person, place, and time. He has normal strength and normal reflexes. No cranial nerve deficit or sensory deficit. Coordination normal. GCS eye subscore is 4. GCS verbal subscore is 5. GCS motor subscore is 6.  Skin: Skin is warm and dry.   ED Course  Procedures (including critical care time) Labs Review Labs Reviewed  PRO B NATRIURETIC PEPTIDE - Abnormal; Notable for the following:    Pro B  Natriuretic peptide (BNP) 24815.0 (*)    All other components within normal limits  COMPREHENSIVE METABOLIC PANEL - Abnormal; Notable for the following:    Glucose, Bld 156 (*)    BUN 52 (*)    Creatinine, Ser 1.52 (*)    Albumin 3.0 (*)    Total Bilirubin 1.6 (*)    GFR calc non Af Amer 40 (*)    GFR calc Af Amer 47 (*)    All other components within normal limits  CBC WITH DIFFERENTIAL - Abnormal; Notable for the following:    WBC 16.3 (*)    RDW 17.5 (*)     Platelets 109 (*)    Neutrophils Relative % 86 (*)    Neutro Abs 14.1 (*)    Lymphocytes Relative 6 (*)    Monocytes Absolute 1.3 (*)    All other components within normal limits  PROTIME-INR - Abnormal; Notable for the following:    Prothrombin Time 63.4 (*)    INR 7.97 (*)    All other components within normal limits  APTT - Abnormal; Notable for the following:    aPTT 70 (*)    All other components within normal limits  POCT I-STAT TROPONIN I - Abnormal; Notable for the following:    Troponin i, poc 0.09 (*)    All other components within normal limits  MRSA PCR SCREENING  DIGOXIN LEVEL  TROPONIN I  DIGOXIN LEVEL  BASIC METABOLIC PANEL  TSH  HEMOGLOBIN A1C   Imaging Review Dg Chest 2 View  06/29/2013   CLINICAL DATA:  Shortness of breath and cough.  EXAM: CHEST  2 VIEW  COMPARISON:  CT chest 06/23/2008 and PA and lateral chest 06/10/2008.  FINDINGS: Dual lead pacing device is in place, unchanged. There is cardiomegaly but no pulmonary edema. Small bilateral pleural effusions are identified. No pneumothorax or pleural effusion.  IMPRESSION: Cardiomegaly without edema.  Small bilateral pleural effusions.   Electronically Signed   By: Inge Rise M.D.   On: 06/29/2013 19:29    EKG Interpretation    Date/Time:  Sunday June 29 2013 17:47:37 EST Ventricular Rate:  141 PR Interval:    QRS Duration: 162 QT Interval:  306 QTC Calculation: 468 R Axis:   91 Text Interpretation:  Atrial fibrillation with rapid ventricular response Rightward axis Left bundle branch block Abnormal ECG Confirmed by Zenia Resides  MD, ANTHONY ([id number]) on 06/29/2013 6:01:45 PM           MDM   [name] [name] is a 79 y/o male with history as noted below who presents with cc of shortness of breath. Denies CP. Troponin mildly elevated. BNP elevated but no frank pulmonary edema on CXR. No evidence of pneumonia, dissection or pneumothorax on CXR. INR is supratherapetuic but no active bleeding. EKG c/w afib  with RVR. Attempted dilt bolus but the patient became hypotensive. He responded to IVF and was started on a diltiazem gtt slowly and was able to tolerate it without any hypotension. Do not suspect PE as the patient has no major risk factors and is currently anticoagulated. Cardiology was consulted and the patient was admitted to cardiology for continued workup and management.   1. Diabetes mellitus without complication   2. Atrial fibrillation with RVR 3. Supratherapuetic INR 4. Edema 5. Congestive Heart Failure     Donita Brooks, MD 06/30/13 0003

## 2013-06-29 NOTE — ED Provider Notes (Signed)
I saw and evaluated the patient, reviewed the resident's note and I agree with the findings and plan.  EKG Interpretation    Date/Time:  Sunday June 29 2013 17:47:37 EST Ventricular Rate:  141 PR Interval:    QRS Duration: 162 QT Interval:  306 QTC Calculation: 468 R Axis:   91 Text Interpretation:  Atrial fibrillation with rapid ventricular response Rightward axis Left bundle branch block Abnormal ECG Confirmed by Murrel  MD, Steele Stracener ([id number]) on 06/29/2013 6:01:45 PM           Patient here with shortness of breath times one month worse last 24 hours. Notes increased lower extremity edema. Denies any chest pain or palpitations. Patient's EKG here shows atrial fibrillation with rapid ventricular rate response. Was given Cardizem 10 mg IV pressure did have some hypotension that slowly improved. Will require inpatient admission for treatment of atrial fibrillation will consult cardiology  Leota Jacobsen, MD 06/29/13 1907

## 2013-06-29 NOTE — ED Notes (Signed)
Attempted to draw troponin from left hand without success. Less than 12mL in tube.

## 2013-06-29 NOTE — ED Notes (Signed)
The pt has had sob since thanksgiving getting wrose daily.  He lives alone and for the past several days he has had swelling and weeping lower extremities.  hes alert

## 2013-06-29 NOTE — Progress Notes (Signed)
ANTICOAGULATION CONSULT NOTE - Initial Consult  Pharmacy Consult for Coumadin Indication: atrial fibrillation  Allergies  Allergen Reactions  . Amiodarone Other (See Comments)    Amiodarone induced hepatitis    Patient Measurements: Height: 5\' 9"  (175.3 cm) Weight: 160 lb (72.576 kg) IBW/kg (Calculated) : 70.7  Vital Signs: BP: 105/84 mmHg (12/21 2100) Pulse Rate: 136 (12/21 2024)  Labs:  Recent Labs  06/29/13 1840  HGB 14.1  HCT 42.8  PLT 109*  APTT 70*  LABPROT 63.4*  INR 7.97*  CREATININE 1.52*    Estimated Creatinine Clearance: 36.2 ml/min (by C-G formula based on Cr of 1.52).   Medical History: Past Medical History  Diagnosis Date  . Atrial fibrillation     echo- EF 35-40%; LA severely dilated; RA mod dilated, RV systolic pressure 0000000; LV systolic fcn mod reduced  . Atrial fibrillation     stress test - post stress EF 28%, AFib w/ RVR and LBBB, apical and septal thinning; increased RV free wall uptake consistent w/ pressure and/or volume overload  . Aortic valvular stenoses     not symtomatic  . SSS (sick sinus syndrome)     pacemaker placed in 1993  . Diabetes mellitus without complication     Medications:  See electronic med rec  Assessment: 79 y.o. male presents with edema, SOB, rapid afib. Pt on coumadin PTA for afib. Baseline INR 7.97. CBC stable, plt low. No overt bleeding noted.  Goal of Therapy:  INR 2-3 Monitor platelets by anticoagulation protocol: Yes   Plan:  1. No coumadin today 2. Daily INR  Sherlon Handing, PharmD, BCPS Clinical pharmacist, pager [phone] 06/29/2013,9:09 PM

## 2013-06-29 NOTE — ED Notes (Signed)
Pt has good cap refill, mentating appropriately- sitting in high fowlers. O2 sats 100%/RA placed on 2L for comfort.

## 2013-06-29 NOTE — ED Notes (Signed)
Unable to locate cardizem in Pod B, C or D. Called pharmacy sts to check pod A

## 2013-06-29 NOTE — ED Notes (Signed)
Dr. Zenia Resides and Dr. Jamse Arn at bedside. sts to have fluids wide open until systolic pressure is in the 100's then start cardizem drip.

## 2013-06-29 NOTE — H&P (Signed)
History and Physical   Admit date: 06/29/2013 Name:  [name] [name] Medical record number: [id number] DOB/Age:  12-Dec-1926  79 y.o. male  Referring Physician:   [facility]  Primary Cardiologist:  Formally Dr. Rollene Fare, do to see Dr. Hosie Spangle  Primary Physician:   Dr. Nelda Bucks  Chief complaint/reason for admission: Edema, shortness of breath and rapid atrial fibrillation  HPI:  This 79 year-old male has a history of LV dysfunction with an EF of around 35-40% by echo in March. He has a history of atrial fibrillation of undetermined age of onset and also has a history of aortic stenosis that was moderate to severe on the last echo. He has been on chronic warfarin therapy. He became ill with cough and bronchitis and has been treated with antibiotics. Recently his blood pressure has become low and according to records that his son brought in amlodipine, diltiazem and lisinopril were stopped and recently his metoprolol dose was cut in half. He has had a two-week history of worsening edema and was brought to the emergency tonight with shortness of breath and worsening edema and found to be in rapid atrial fibrillation. He has no chest pain suggestive of angina. He has had difficulty sleeping at night. His appetite has been reduced. He has not had any significant syncope.    Past Medical History  Diagnosis Date  . Atrial fibrillation     echo- EF 35-40%; LA severely dilated; RA mod dilated, RV systolic pressure 0000000; LV systolic fcn mod reduced  . Atrial fibrillation     stress test - post stress EF 28%, AFib w/ RVR and LBBB, apical and septal thinning; increased RV free wall uptake consistent w/ pressure and/or volume overload  . Aortic valvular stenoses     not symtomatic  . SSS (sick sinus syndrome)     pacemaker placed in 1993  . Diabetes mellitus without complication       Past Surgical History  Procedure Laterality Date  . Pacemaker insertion  1993  . Biv  pacemaker generator change out  11/2004  . Hernia repair     Allergies: is allergic to amiodarone.   Medications: Prior to Admission medications   Medication Sig Start Date End Date Taking? Authorizing Provider  budesonide-formoterol (SYMBICORT) 160-4.5 MCG/ACT inhaler Inhale 2 puffs into the lungs 2 (two) times daily.   Yes Historical Provider, MD  carbidopa-levodopa (SINEMET IR) 25-100 MG per tablet Take 1 tablet by mouth 3 (three) times daily.   Yes Historical Provider, MD  digoxin (LANOXIN) 0.125 MG tablet Take 0.125 mg by mouth daily.   Yes Historical Provider, MD  fish oil-omega-3 fatty acids 1000 MG capsule Take 2 g by mouth daily.   Yes Historical Provider, MD  furosemide (LASIX) 20 MG tablet Take 20 mg by mouth daily.   Yes Historical Provider, MD  linagliptin (TRADJENTA) 5 MG TABS tablet Take 5 mg by mouth daily.   Yes Historical Provider, MD  metoprolol succinate (TOPROL-XL) 100 MG 24 hr tablet Take 50 mg by mouth daily. Take with or immediately following a meal.   Yes Historical Provider, MD  saxagliptin HCl (ONGLYZA) 5 MG TABS tablet Take 5 mg by mouth daily.   Yes Historical Provider, MD  vitamin E 400 UNIT capsule Take 400 Units by mouth daily.   Yes Historical Provider, MD  warfarin (COUMADIN) 5 MG tablet Take 5 mg by mouth as directed.   Yes Historical Provider, MD    Family History:  Family  Status  Relation Status Death Age  . Father Deceased 58    asthma  . Mother Deceased 53    hypertension, atherosclerosis  . Brother Deceased     stomach cancer  . Brother Deceased     COPD  . Sister Deceased 79's    old age    Social History:   reports that he quit smoking about 44 years ago. He has never used smokeless tobacco. He reports that he does not drink alcohol or use illicit drugs.   History   Social History Narrative   Widower.  Retired Hydrographic surveyor.     Review of Systems: His weight has been stable. He has had significant nocturia and has had significant  malaise and fatigue. He has no recent history of GI bleed reports difficulty with intolerance to medicine a number of years ago. He has had severe dyspnea with exertion recently and has had chronic edema and dyspnea recently. Other than as noted above, the remainder of the review of systems is normal  Physical Exam: BP 103/84  Pulse 131  Resp 28  Ht 5\' 9"  (1.753 m)  Wt 72.576 kg (160 lb)  BMI 23.62 kg/m2  SpO2 100% General appearance: elderly pleasant male who appears in mild respiratory distress, wearing oxygen Head: Normocephalic, without obvious abnormality, atraumatic, Balding male hair pattern Eyes: conjunctivae/corneas clear. PERRL, EOM's intact. Fundi not examined Neck: no adenopathy, no carotid bruit, supple, symmetrical, trachea midline and Neck veins difficult to assess and appear to be elevated Lungs: Reduced breath sounds at bases Heart: Rapid irregular rhythm, soft [id number] systolic murmur at aortic area Abdomen: soft, non-tender; bowel sounds normal; no masses,  no organomegaly Rectal: deferred Extremities: 2-3+ peripheral edema Pulses: Femoral pulses are present, distal pulses are difficult to feel due to the presence of edema Skin: scattered ecchymoses Neurologic: Grossly normal  Labs: CBC  Recent Labs  06/29/13 1840  WBC 16.3*  RBC 4.52  HGB 14.1  HCT 42.8  PLT 109*  MCV 94.7  MCH 31.2  MCHC 32.9  RDW 17.5*  LYMPHSABS 0.9  MONOABS 1.3*  EOSABS 0.1  BASOSABS 0.0   CMP   Recent Labs  06/29/13 1840  NA 138  K 4.6  CL 100  CO2 25  GLUCOSE 156*  BUN 52*  CREATININE 1.52*  CALCIUM 8.6  PROT 6.2  ALBUMIN 3.0*  AST 32  ALT 20  ALKPHOS 58  BILITOT 1.6*  GFRNONAA 40*  GFRAA 47*   BNP (last 3 results)  Recent Labs  06/29/13 1840  PROBNP 24815.0*   Cardiac Panel (last 3 results) Troponin (Point of Care Test)  Recent Labs  06/29/13 1851  TROPIPOC 0.09*   EKG: Atrial fibrillation with rapid ventricular response, left bundle branch  block, right axis deviation  Radiology: Cardiomegaly, small bilateral pleural effusion, relatively clear lung fields   IMPRESSIONS: 1. Acute on chronic systolic congestive heart failure 2. Atrial fibrillation with rapid ventricular response 3. Aortic stenosis 4. Over anticoagulation with warfarin 5. Hypertension by history 6. Recent onset of diabetes 7. Functioning permanent pacemaker 8. Left bundle branch block 9. 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 He will need to have his atrial fibrillation rate controlled with intravenous diltiazem and also will need to have a repeat echocardiogram to assess LV function. Consider use of digoxin to help control rate. May need amiodarone and also help control rate. Warfarin will be held at the present time. He will be given intravenous  diuresis.  He has an amiodarone allergy listed in review of his old medical records are not available apical need to be done to determine his prior cardiac history as well as atrial fibrillation. We will hold his oral diabetic medicines and use sliding scale at the present time.  Signed: Kerry Hough MD [facility]  06/29/2013, 8:27 PM

## 2013-06-29 NOTE — ED Notes (Signed)
Spoke to Dr. Wynonia Lawman- plan to give digoxin & to bump up patients cardizem to 15mg /hr, check pressure in 10 min and give lasix.

## 2013-06-30 DIAGNOSIS — I5023 Acute on chronic systolic (congestive) heart failure: Secondary | ICD-10-CM

## 2013-06-30 DIAGNOSIS — E119 Type 2 diabetes mellitus without complications: Secondary | ICD-10-CM

## 2013-06-30 DIAGNOSIS — I509 Heart failure, unspecified: Secondary | ICD-10-CM

## 2013-06-30 DIAGNOSIS — I359 Nonrheumatic aortic valve disorder, unspecified: Secondary | ICD-10-CM

## 2013-06-30 DIAGNOSIS — I059 Rheumatic mitral valve disease, unspecified: Secondary | ICD-10-CM

## 2013-06-30 DIAGNOSIS — Z950 Presence of cardiac pacemaker: Secondary | ICD-10-CM

## 2013-06-30 DIAGNOSIS — I4891 Unspecified atrial fibrillation: Principal | ICD-10-CM

## 2013-06-30 LAB — BASIC METABOLIC PANEL
CO2: 23 mEq/L (ref 19–32)
Calcium: 8 mg/dL — ABNORMAL LOW (ref 8.4–10.5)
Chloride: 102 mEq/L (ref 96–112)
Creatinine, Ser: 1.28 mg/dL (ref 0.50–1.35)
GFR calc Af Amer: 58 mL/min — ABNORMAL LOW (ref >90)
Glucose, Bld: 126 mg/dL — ABNORMAL HIGH (ref 70–99)
Potassium: 4 mEq/L (ref 3.5–5.1)
Sodium: 136 mEq/L (ref 135–145)

## 2013-06-30 LAB — HEMOGLOBIN A1C
Hgb A1c MFr Bld: 7.1 % — ABNORMAL HIGH (ref <5.7)
Mean Plasma Glucose: 157 mg/dL — ABNORMAL HIGH (ref <117)

## 2013-06-30 LAB — MRSA PCR SCREENING: MRSA by PCR: NEGATIVE

## 2013-06-30 LAB — GLUCOSE, CAPILLARY
Glucose-Capillary: 127 mg/dL — ABNORMAL HIGH (ref 70–99)
Glucose-Capillary: 137 mg/dL — ABNORMAL HIGH (ref 70–99)

## 2013-06-30 MED ORDER — POTASSIUM CHLORIDE CRYS ER 10 MEQ PO TBCR
10.0000 meq | EXTENDED_RELEASE_TABLET | Freq: Every day | ORAL | Status: DC
  Administered 2013-06-30 – 2013-07-06 (×7): 10 meq via ORAL
  Filled 2013-06-30 (×8): qty 1

## 2013-06-30 MED ORDER — FUROSEMIDE 40 MG PO TABS
40.0000 mg | ORAL_TABLET | Freq: Every day | ORAL | Status: DC
  Administered 2013-06-30 – 2013-07-08 (×9): 40 mg via ORAL
  Filled 2013-06-30 (×9): qty 1

## 2013-06-30 MED ORDER — PERFLUTREN LIPID MICROSPHERE
1.0000 mL | INTRAVENOUS | Status: AC | PRN
  Administered 2013-06-30: 2 mL via INTRAVENOUS
  Filled 2013-06-30: qty 10

## 2013-06-30 MED ORDER — DIGOXIN 0.25 MG/ML IJ SOLN
0.2500 mg | Freq: Once | INTRAMUSCULAR | Status: AC
Start: 2013-06-30 — End: 2013-06-30
  Administered 2013-06-30: 0.25 mg via INTRAVENOUS
  Filled 2013-06-30: qty 1

## 2013-06-30 NOTE — Progress Notes (Signed)
Dr Wynonia Lawman notified of decrease made on Cardizem drip to 5mg /hr due to B/P's 79-85/60's HRs 120-140. Informed that only Lasix 40mg  was able to be administered because of B/P being so labile Will continue to monitor. No new orders at this time.

## 2013-06-30 NOTE — Consult Note (Signed)
ELECTROPHYSIOLOGY CONSULT NOTE  Patient ID: [name] [name] MRN: [id number], DOB/AGE: July 24, 1926   Admit date: 06/29/2013 Date of Consult: 06/30/2013  Primary Physician: No primary provider on file. Primary Cardiologist: Previously Rollene Fare, MD; now Croitoru, MD Reason for Consultation: Atrial fibrillation  History of Present Illness [name] [name] is a 79 y.o. male with permanent AF, bradycardia s/p PPM implant in 1993 (PPM gen change 2006, atrial lead capped at that time), amiodarone-induced hepatitis, moderate to severe AS and LV dysfunction, EF 35-40% by echo in March 2014 who was admitted yesterday with LE swelling and SOB. He was also found to have RVR. His symptoms started 3 weeks ago and have progressively worsened. He developed SOB at rest and orthopnea which prompted him to call 911. He denies CP, palpitations or syncope. Troponin negative so far. ProBNP [id number]. Digoxin level 1.1. TSH is within normal range. His echo this admission is pending. He remains in rapid AF and use of rate controlling medications has been limited by hypotension. EP has been asked to see for recommendations regarding management of AF.   Past Medical History Past Medical History  Diagnosis Date  . Atrial fibrillation     echo- EF 35-40%; LA severely dilated; RA mod dilated, RV systolic pressure 0000000; LV systolic fcn mod reduced  . Atrial fibrillation     stress test - post stress EF 28%, AFib w/ RVR and LBBB, apical and septal thinning; increased RV free wall uptake consistent w/ pressure and/or volume overload  . Aortic valvular stenoses     not symtomatic  . SSS (sick sinus syndrome)     pacemaker placed in 1993  . Diabetes mellitus without complication     Past Surgical History Past Surgical History  Procedure Laterality Date  . Pacemaker insertion  1993  . Biv pacemaker generator change out  11/2004  . Hernia repair      Allergies/Intolerances Allergies  Allergen Reactions  . Amiodarone  Other (See Comments)    Amiodarone induced hepatitis    Inpatient Medications . carbidopa-levodopa  1 tablet Oral TID  . digoxin  0.125 mg Oral Daily  . insulin aspart  0-9 Units Subcutaneous TID WC  . metoprolol succinate  50 mg Oral Daily  . Warfarin - Pharmacist Dosing Inpatient   Does not apply q1800   . diltiazem (CARDIZEM) infusion 5 mg/hr (06/30/13 0230)    Family History Positive for CAD   Social History History   Social History  . Marital Status: Widowed    Spouse Name: N/A    Number of Children: N/A  . Years of Education: N/A   Occupational History  . Not on file.   Social History Main Topics  . Smoking status: Former Smoker    Quit date: 07/09/1969  . Smokeless tobacco: Never Used  . Alcohol Use: No  . Drug Use: No  . Sexual Activity: Not on file   Other Topics Concern  . Not on file   Social History Narrative   Widower.  Retired Hydrographic surveyor.     Review of Systems General: No chills, fever, night sweats or weight changes  Cardiovascular:  No chest pain, palpitations, paroxysmal nocturnal dyspnea Dermatological: No rash, lesions or masses Respiratory: No cough Urologic: No hematuria, dysuria Abdominal: No nausea, vomiting, diarrhea, bright red blood per rectum, melena, or hematemesis Neurologic: No visual changes, weakness, changes in mental status All other systems reviewed and are otherwise negative except as noted above.  Physical Exam Vitals: Blood pressure 118/71, pulse  120, temperature 97.6 F (36.4 C), temperature source Oral, resp. rate 17, height 5\' 9"  (1.753 m), weight 164 lb 7.4 oz (74.6 kg), SpO2 93.00%.  General: Well developed, well appearing 79 y.o. male in no acute distress. HEENT: Normocephalic, atraumatic. EOMs intact. Sclera nonicteric. Oropharynx clear.  Neck: Supple without bruits. No JVD. Lungs: Respirations regular and unlabored, CTA bilaterally. No wheezes, rales or rhonchi. Heart: IRRR. S1, S2 present. Soft systolic  murmur, rub, S3 or S4. Abdomen: Soft, non-tender, non-distended. BS present x 4 quadrants. No hepatosplenomegaly.  Extremities: No clubbing, cyanosis or edema. DP/PT/Radials 2+ and equal bilaterally. Psych: Normal affect. Neuro: Alert and oriented X 3. Moves all extremities spontaneously. Musculoskeletal: No kyphosis. Skin: Intact. Warm and dry. No rashes or petechiae in exposed areas.   Labs  Recent Labs  06/29/13 2045  TROPONINI <0.30   Lab Results  Component Value Date   WBC 16.3* 06/29/2013   HGB 14.1 06/29/2013   HCT 42.8 06/29/2013   MCV 94.7 06/29/2013   PLT 109* 06/29/2013    Recent Labs Lab 06/29/13 1840 06/30/13 0440  NA 138 136  K 4.6 4.0  CL 100 102  CO2 25 23  BUN 52* 48*  CREATININE 1.52* 1.28  CALCIUM 8.6 8.0*  PROT 6.2  --   BILITOT 1.6*  --   ALKPHOS 58  --   ALT 20  --   AST 32  --   GLUCOSE 156* 126*    Recent Labs  06/29/13 2100  TSH 0.709    Recent Labs  06/29/13 1840  INR 7.97*    Radiology/Studies Dg Chest 2 View 06/29/2013   CLINICAL DATA:  Shortness of breath and cough.  EXAM: CHEST  2 VIEW  COMPARISON:  CT chest 06/23/2008 and PA and lateral chest 06/10/2008.  FINDINGS: Dual lead pacing device is in place, unchanged. There is cardiomegaly but no pulmonary edema. Small bilateral pleural effusions are identified. No pneumothorax or pleural effusion.   IMPRESSION: Cardiomegaly without edema.  Small bilateral pleural effusions.    Electronically Signed   By: Inge Rise M.D.   On: 06/29/2013 19:29   Echocardiogram  EF 20%, moderate to severe AS  12-lead ECG on admission - WCT most consistent with rapid atrial fibrillation with LBBB (not new) at 141 bpm Telemetry - AF w/RVR, rates consistently 120s  Assessment and Plan 1. Acute on chronic systolic HF, EF 123456 by echo March 2014, now 20% - diuresis per primary cardiologist, Dr Sallyanne Kuster 2. Permanent AF, now with persistent RVR - rate controlling options limited by  hypotension - digoxin long term not ideal in setting of advanced age and renal dysfunction - discussed options with patient and family including CRT-P upgrade +AV node ablation 3. Hypotension 4. Moderate to severe AS - echo this admission pending 5. LBBB 6. History of amiodarone-induced hepatitis  Dr. Lovena Le to see Signed, Ileene Hutchinson, PA-C 06/30/2013, 11:37 AM  EP Attending  Patient seen and examined. Agree with above with amendments. He has severe LV dysfunction, hypotension and uncontrolled atrial fibrillation. His medical options have been limited by hypotension. His INR has been elevated. I would recommend AV node ablation and BiV ppm upgrade. I will defer decision about right and left heart cath to his primary cardiologist. Would consider performing BiV PPM upgrade on Friday. I have discussed this with the patient and he is interested in proceeding.  Mikle Bosworth.D.

## 2013-06-30 NOTE — Progress Notes (Signed)
Utilization Review Completed.  

## 2013-06-30 NOTE — Progress Notes (Signed)
  Echocardiogram 2D Echocardiogram has been performed.  Kemberly Taves 06/30/2013, 11:44 AM

## 2013-06-30 NOTE — Progress Notes (Signed)
Echo shows substantial deterioration in LVEF from last study. It also raise the concern for low gradient severe aortic stenosis I think he should undergo cardiac catheterization and coronary angiography, but this will be delayed due to excessive anticoagulation (in turn likely secondary to CHF related hepatic congestion). Might need a dobutamine echo, not feasible while he has AF with RVR Start diuretics, but have to move slowly due to hypotension, which luckily is asymptomatic. Discussed , in principal, possible need for AV node ablation and biventricular pacing with Dr. Lovena Le.  Sanda Klein, MD, [facility] [phone] office [phone] pager

## 2013-06-30 NOTE — Progress Notes (Signed)
Subjective: [name] [name], no orthopnea  Objective: Vital signs in last 24 hours: Temp:  [97.5 F (36.4 C)-98.6 F (37 C)] 97.6 F (36.4 C) (12/22 0734) Pulse Rate:  [64-148] 92 (12/22 0734) Resp:  [14-34] 17 (12/22 0734) BP: (79-123)/(55-97) 90/69 mmHg (12/22 0734) SpO2:  [91 %-100 %] 93 % (12/22 0734) FiO2 (%):  [28 %] 28 % (12/21 2040) Weight:  [160 lb (72.576 kg)-165 lb 5.5 oz (75 kg)] 164 lb 7.4 oz (74.6 kg) (12/22 0411)    Intake/Output from previous day: 12/21 0701 - 12/22 0700 In: 84.2 [I.V.:84.2] Out: 975 [Urine:975] Intake/Output this shift:    Medications Current Facility-Administered Medications  Medication Dose Route Frequency Provider Last Rate Last Dose  . acetaminophen (TYLENOL) tablet 650 mg  650 mg Oral Q4H PRN Jacolyn Reedy, MD      . carbidopa-levodopa (SINEMET IR) 25-100 MG per tablet immediate release 1 tablet  1 tablet Oral TID Jacolyn Reedy, MD   1 tablet at 06/30/13 0035  . digoxin (LANOXIN) tablet 0.125 mg  0.125 mg Oral Daily Jacolyn Reedy, MD      . diltiazem (CARDIZEM) 100 mg in dextrose 5 % 100 mL infusion  5-15 mg/hr Intravenous Titrated Donita Brooks, MD 5 mL/hr at 06/30/13 0230 5 mg/hr at 06/30/13 0230  . furosemide (LASIX) injection 80 mg  80 mg Intravenous Q12H Jacolyn Reedy, MD   40 mg at 06/30/13 0014  . insulin aspart (novoLOG) injection 0-9 Units  0-9 Units Subcutaneous TID WC Jacolyn Reedy, MD      . metoprolol (LOPRESSOR) injection 5 mg  5 mg Intravenous Q6H PRN Jacolyn Reedy, MD      . metoprolol succinate (TOPROL-XL) 24 hr tablet 50 mg  50 mg Oral Daily Jacolyn Reedy, MD      . Warfarin - Pharmacist Dosing Inpatient   Does not apply q1800 Sindy [place], [facility]        PE: General appearance: alert, cooperative and no distress Lungs: clear to auscultation bilaterally Heart: irregularly irregular rhythm Extremities: 2+ [name] [name] greater on the left Pulses: 1+ radial and DPs Skin: Warm and dry.  Legs are wheeping and  gauze wrapping in place Neurologic: Grossly normal  Lab Results:   Recent Labs  06/29/13 1840  WBC 16.3*  HGB 14.1  HCT 42.8  PLT 109*   BMET  Recent Labs  06/29/13 1840 06/30/13 0440  NA 138 136  K 4.6 4.0  CL 100 102  CO2 25 23  GLUCOSE 156* 126*  BUN 52* 48*  CREATININE 1.52* 1.28  CALCIUM 8.6 8.0*   PT/INR  Recent Labs  06/29/13 1840  LABPROT 63.4*  INR 7.97*   Assessment/Plan    Principal Problem:   Atrial fibrillation Active Problems:   Acute on chronic systolic congestive heart failure   Presence of permanent cardiac pacemaker   Aortic stenosis   Current use of long term anticoagulation   Chronic kidney disease stage III   Diabetes mellitus without complication  Plan:  INR is supratherapeutic at 7.97!  Pharmacy is following.  Rate still in the 110's-140.  It appears he may be slowing down.  He did receive digoxin bolus and is on 5mg /hr of diltiazem.  He is hypotensive.   Net fluids: -0.9L.  His BNP is elevated with [name] [name], however, his lungs are clear and he does not have orthopnea.  DCing lasix for now.  We can reevaluate tomorrow.   WBCs 16.3.  Afebrile.  If he does not slow down, may need to try Amiodarone.   LOS: 1 day    [name], [name] 06/30/2013 8:58 AM  I have seen and examined the patient along with Tarri Fuller, PA.  I have reviewed the chart, notes and new data.  I agree with PA/NP's note.  Key new complaints: no dyspnea at rest Key examination changes: S3 present, JVP up, hard to say how much Key new findings / data: massive cardiomegaly on CXR, left atrium ESD 47 mm at last echo, EF 28% by QGS (normal perfusion pattern), 35-40% by echo  PLAN: Sounds like decompensation began around Thanksgiving. I am not clear as to the etiology of his cardiomyopathy - I do not think he has had coronary angio. Low EF dates back many years and he does not have angina or nuclear perfusion deficits. May need cath after stabilization. Needs diuresis,  difficult with his BP. Reevaluate EF and AS by echo, although at this heart rate imaging may be suboptimal. Continue digoxin loading (note creat clearance around 50). 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 Beta blockers and diltiazem are of limited usefulness due to low BP. Ultimate solution may be AV node ablation. Consider upgrade to CRT-P if he has AVN ablation (QRS duration now 162 ms, at slower heart rate in September QRS was 116 incomplete LBBB). EP consult.  Sanda Klein, MD, [facility] [phone] 06/30/2013, 9:42 AM

## 2013-06-30 NOTE — Progress Notes (Signed)
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 His blood pressure has been soft on the floor and his diltiazem was reduced by the nurse. I will give him another bolus of digoxin of 0.25x1 and recheck level in the morning. Options for rate control are going to be difficult in him since he has chronic atrial fibrillation.  Kerry Hough MD [facility]

## 2013-07-01 DIAGNOSIS — Z7901 Long term (current) use of anticoagulants: Secondary | ICD-10-CM

## 2013-07-01 LAB — BASIC METABOLIC PANEL
BUN: 45 mg/dL — ABNORMAL HIGH (ref 6–23)
Calcium: 7.8 mg/dL — ABNORMAL LOW (ref 8.4–10.5)
Chloride: 104 mEq/L (ref 96–112)
Creatinine, Ser: 1.28 mg/dL (ref 0.50–1.35)
GFR calc non Af Amer: 50 mL/min — ABNORMAL LOW (ref >90)
Glucose, Bld: 116 mg/dL — ABNORMAL HIGH (ref 70–99)
Sodium: 136 mEq/L (ref 135–145)

## 2013-07-01 LAB — GLUCOSE, CAPILLARY

## 2013-07-01 LAB — PROTIME-INR: INR: 4.76 — ABNORMAL HIGH (ref 0.00–1.49)

## 2013-07-01 MED ORDER — DIGOXIN 0.25 MG/ML IJ SOLN
0.2500 mg | Freq: Once | INTRAMUSCULAR | Status: AC
  Administered 2013-07-01: 0.25 mg via INTRAVENOUS
  Filled 2013-07-01: qty 1

## 2013-07-01 NOTE — Progress Notes (Signed)
Pharmacist Heart Failure Core Measure Documentation  Assessment: [name] [name] has an EF documented as 20% on 12/04 by ECHO.  Rationale: Heart failure patients with left ventricular systolic dysfunction (LVSD) and an EF < 40% should be prescribed an angiotensin converting enzyme inhibitor (ACEI) or angiotensin receptor blocker (ARB) at discharge unless a contraindication is documented in the medical record.  This patient is not currently on an ACEI or ARB for HF.  This note is being placed in the record in order to provide documentation that a contraindication to the use of these agents is present for this encounter.  ACE Inhibitor or Angiotensin Receptor Blocker is contraindicated (specify all that apply)  []   ACEI allergy AND ARB allergy []   Angioedema []   Moderate or severe aortic stenosis []   Hyperkalemia []   Hypotension []   Renal artery stenosis [x]   Worsening renal function, preexisting renal disease or dysfunction   Onnie Boer [facility] 07/01/2013 1:20 PM

## 2013-07-01 NOTE — Progress Notes (Signed)
ANTICOAGULATION CONSULT NOTE - Initial Consult  Pharmacy Consult for Coumadin Indication: atrial fibrillation  Allergies  Allergen Reactions  . Amiodarone Other (See Comments)    Amiodarone induced hepatitis    Patient Measurements: Height: 5\' 9"  (175.3 cm) Weight: 167 lb 1.7 oz (75.8 kg) IBW/kg (Calculated) : 70.7  Vital Signs: Temp: 97.4 F (36.3 C) (12/23 0748) Temp src: Oral (12/23 0748) BP: 100/82 mmHg (12/23 0748) Pulse Rate: 133 (12/23 0748)  Labs:  Recent Labs  06/29/13 1840 06/29/13 2045 06/30/13 0440 07/01/13 0425  HGB 14.1  --   --   --   HCT 42.8  --   --   --   PLT 109*  --   --   --   APTT 70*  --   --   --   LABPROT 63.4*  --   --  42.8*  INR 7.97*  --   --  4.76*  CREATININE 1.52*  --  1.28 1.28  TROPONINI  --  <0.30  --   --     Estimated Creatinine Clearance: 43 ml/min (by C-G formula based on Cr of 1.28).   Medical History: Past Medical History  Diagnosis Date  . Atrial fibrillation     echo- EF 35-40%; LA severely dilated; RA mod dilated, RV systolic pressure 0000000; LV systolic fcn mod reduced  . Atrial fibrillation     stress test - post stress EF 28%, AFib w/ RVR and LBBB, apical and septal thinning; increased RV free wall uptake consistent w/ pressure and/or volume overload  . Aortic valvular stenoses     not symtomatic  . SSS (sick sinus syndrome)     pacemaker placed in 1993  . Diabetes mellitus without complication     Medications:  See electronic med rec  Assessment: 79 y.o. male presents with edema, SOB, rapid afib. Pt on coumadin PTA for afib. Baseline INR 7.97. CBC stable, plt low. No overt bleeding noted. INR this AM down to 4.76.  Goal of Therapy:  INR 2-3 Monitor platelets by anticoagulation protocol: Yes   Plan:  No coumadin today Daily INR  [name] [name] B. Leitha Schuller, PharmD Clinical Pharmacist - Resident Pager: [phone] Phone: [phone] 07/01/2013 9:58 AM

## 2013-07-01 NOTE — Progress Notes (Signed)
Subjective: Talking on phone without acute problem   Objective: Vital signs in last 24 hours: Temp:  [97.4 F (36.3 C)-98.6 F (37 C)] 97.4 F (36.3 C) (12/23 0748) Pulse Rate:  [86-143] 133 (12/23 0748) Resp:  [24-30] 29 (12/23 0748) BP: (72-108)/(49-82) 100/82 mmHg (12/23 0748) SpO2:  [94 %-100 %] 100 % (12/23 0748) Weight:  [167 lb 1.7 oz (75.8 kg)] 167 lb 1.7 oz (75.8 kg) (12/23 0500) Weight change: 7 lb 1.7 oz (3.225 kg) Last BM Date: 07/01/13 Intake/Output from previous day: -545 (-1435 since admit)  Wt up from 160 to 167 12/22 0701 - 12/23 0700 In: 130 [I.V.:130] Out: 675 [Urine:675] Intake/Output this shift: Total I/O In: 215 [P.O.:200; I.V.:15] Out: 150 [Urine:150]  PE: General:Pleasant affect, NAD Skin:Warm and dry, brisk capillary refill HEENT:normocephalic, sclera clear, mucus membranes moist Neck:supple, mild JVD  Heart:irreg irreg without murmur, gallup, rub or click Lungs:clear without rales, rhonchi, or wheezes [id number], non tender, + BS, do not palpate liver spleen or masses Ext:2+ lower ext edema- mushy edema, 2+ pedal pulses, 2+ radial pulses Neuro:alert and oriented, MAE, follows commands, + facial symmetry   Lab Results:  Recent Labs  06/29/13 1840  WBC 16.3*  HGB 14.1  HCT 42.8  PLT 109*   BMET  Recent Labs  06/30/13 0440 07/01/13 0425  NA 136 136  K 4.0 4.7  CL 102 104  CO2 23 20  GLUCOSE 126* 116*  BUN 48* 45*  CREATININE 1.28 1.28  CALCIUM 8.0* 7.8*    Recent Labs  06/29/13 2045  TROPONINI <0.30    No results found for this basename: CHOL, HDL, LDLCALC, LDLDIRECT, TRIG, CHOLHDL   Lab Results  Component Value Date   HGBA1C 7.1* 06/29/2013     Lab Results  Component Value Date   TSH 0.709 06/29/2013    Hepatic Function Panel  Recent Labs  06/29/13 1840  PROT 6.2  ALBUMIN 3.0*  AST 32  ALT 20  ALKPHOS 58  BILITOT 1.6*     Studies/Results: Dg Chest 2 View  06/29/2013   CLINICAL DATA:   Shortness of breath and cough.  EXAM: CHEST  2 VIEW  COMPARISON:  CT chest 06/23/2008 and PA and lateral chest 06/10/2008.  FINDINGS: Dual lead pacing device is in place, unchanged. There is cardiomegaly but no pulmonary edema. Small bilateral pleural effusions are identified. No pneumothorax or pleural effusion.  IMPRESSION: Cardiomegaly without edema.  Small bilateral pleural effusions.   Electronically Signed   By: Inge Rise M.D.   On: 06/29/2013 19:29   06/30/13 2 D Echo: Left ventricle: The cavity size was normal. Wall thickness was normal. Systolic function was severely reduced. The estimated ejection fraction was in the range of 20% to 25%. Diffuse hypokinesis. The study is not technically sufficient to allow evaluation of LV diastolic function. - Ventricular septum: Septal motion showed paradox. These changes are consistent with a left bundle branch block. - Aortic valve: Trivial regurgitation. Valve area: 0.93cm^2(VTI). Valve area: 0.85cm^2 (Vmax). - Mitral valve: Calcified annulus. Mildly thickened leaflets . Mild regurgitation. - Left atrium: The atrium was severely dilated. - Right ventricle: Systolic function was mildly reduced. - Right atrium: The atrium was severely dilated. - Pulmonary arteries: Systolic pressure was mildly to moderately increased. PA peak pressure: 66mm Hg (S). Impressions:  - Low gradient probably severe AS. May need future dobutamine echo.  Medications:  I have reviewed medications. Scheduled Meds: . carbidopa-levodopa  1 tablet Oral  TID  . digoxin  0.125 mg Oral Daily  . furosemide  40 mg Oral Daily  . insulin aspart  0-9 Units Subcutaneous TID WC  . metoprolol succinate  50 mg Oral Daily  . potassium chloride  10 mEq Oral Daily  . Warfarin - Pharmacist Dosing Inpatient   Does not apply q1800   Continuous Infusions: . diltiazem (CARDIZEM) infusion 5 mg/hr (07/01/13 0543)   PRN Meds:.acetaminophen,  metoprolol  Assessment/Plan: Principal Problem:   Atrial fibrillation Active Problems:   Acute on chronic systolic congestive heart failure   Presence of permanent cardiac pacemaker   Aortic stenosis   Current use of long term anticoagulation   Chronic kidney disease stage III   Diabetes mellitus without complication  PLAN:  Per Dr. Sallyanne Kuster:  "Echo shows substantial deterioration in LVEF from last study. It also raise the concern for low gradient severe aortic stenosis  I think he should undergo cardiac catheterization and coronary angiography, but this will be delayed due to excessive anticoagulation (in turn likely secondary to CHF related hepatic congestion). Might need a dobutamine echo, not feasible while he has AF with RVR  Start diuretics, but have to move slowly due to hypotension, which luckily is asymptomatic.  Discussed , in principal, possible need for AV node ablation and biventricular pacing with Dr. Lovena Le. "  INR continues at 4.76, will recheck pro BNP.   BP low last pm to 72/49, IV dilt continues HR up to 133.  Has rec'd 2 doses of 0.25 of IV dig and now on 0.125 daily.  CKD stage 3, may need another dose.  BP in the 90's .  IV dilt at 5 mg. Lasix is po to diuresis without decreasing BP.   LOS: 2 days   Time spent with pt. :15 minutes. [facility]  Nurse Practitioner Certified Pager [phone] or after 5pm and on weekends call [phone] 07/01/2013, 10:38 AM   Agree with note written by Cecilie Kicks RNP  AFIB with RVR. PTVPM. Pt of Dr. Lurline Del. He has newly recognized LV Dysfunction and at least moderate AS if not more severe. Admitted with CHF. He is hypotensive but tolerating this. Getting PO lasix, dig and IV dilt however dose is limited by decreased BP. INR supra therapeutic. Agreed that he will need a R/L heart cath at some point. EP service involved as well.  Lorretta Harp 07/01/2013 3:43 PM

## 2013-07-01 NOTE — ED Provider Notes (Signed)
I saw and evaluated the patient, reviewed the resident's note and I agree with the findings and plan.  EKG Interpretation    Date/Time:  Sunday June 29 2013 20:44:12 EST Ventricular Rate:  78 PR Interval:  132 QRS Duration: 88 QT Interval:  385 QTC Calculation: 438 R Axis:   -49 Text Interpretation:  Sinus rhythm Left anterior fascicular block Abnormal R-wave progression, late transition Confirmed by Zenia Resides  MD, Gleason Ardoin ([id number]) on 06/29/2013 10:52:06 PM             Leota Jacobsen, MD 07/01/13 2005

## 2013-07-02 LAB — PROTIME-INR
INR: 2.71 — ABNORMAL HIGH (ref 0.00–1.49)
Prothrombin Time: 27.8 seconds — ABNORMAL HIGH (ref 11.6–15.2)

## 2013-07-02 LAB — BASIC METABOLIC PANEL
CO2: 24 mEq/L (ref 19–32)
Chloride: 105 mEq/L (ref 96–112)
Creatinine, Ser: 1.25 mg/dL (ref 0.50–1.35)
Glucose, Bld: 108 mg/dL — ABNORMAL HIGH (ref 70–99)
Potassium: 3.9 mEq/L (ref 3.5–5.1)
Sodium: 139 mEq/L (ref 135–145)

## 2013-07-02 LAB — PRO B NATRIURETIC PEPTIDE: Pro B Natriuretic peptide (BNP): 15424 pg/mL — ABNORMAL HIGH (ref 0–450)

## 2013-07-02 MED ORDER — SODIUM CHLORIDE 0.9 % IJ SOLN
3.0000 mL | Freq: Two times a day (BID) | INTRAMUSCULAR | Status: DC
  Administered 2013-07-02 – 2013-07-07 (×10): 3 mL via INTRAVENOUS

## 2013-07-02 MED ORDER — SODIUM CHLORIDE 0.9 % IV SOLN
10.0000 mL/h | INTRAVENOUS | Status: DC
  Administered 2013-07-02: 10 mL/h via INTRAVENOUS

## 2013-07-02 MED ORDER — SODIUM CHLORIDE 0.9 % IJ SOLN: 3.0000 mL | INTRAMUSCULAR | Status: DC | PRN

## 2013-07-02 NOTE — Evaluation (Signed)
Occupational Therapy Evaluation Patient Details Name: [name] [name] MRN: [id number] DOB: July 18, 1926 Today's Date: 07/02/2013 Time: [id number] OT Time Calculation (min): 32 min  OT Assessment / Plan / Recommendation History of present illness THis 79 y.o. male with known LV dysfunction admitted with increasing dyspnea and increased LE edema.  Pt found to be in Afib with RVR   Clinical Impression   Pt admitted with above.  He demonstrates the below listed deficits and will benefit from continued OT to maximize safety and independence with BADLs to allow him to return home modified independently.  Pt. Noted with LOB posteriorly at times requiring min A to correct.   See below for vitals during eval.  Anticipate he will need intermittent supervision - 24 hour supervision initially upon discharge, and no OT recommended at follow up.    OT Assessment  Patient needs continued OT Services    Follow Up Recommendations  No OT follow up;Supervision/Assistance - 24 hour (initially )    Barriers to Discharge Decreased caregiver support    Equipment Recommendations  None recommended by OT    Recommendations for Other Services    Frequency  Min 2X/week    Precautions / Restrictions Precautions Precautions: Fall Restrictions Weight Bearing Restrictions: No   Pertinent Vitals/Pain BP supine 107/74; sitting 110/67; standing 98/73 (pt denies dizziness).  BP retaken and was 105/75, and at end of session 110/78.   HR 97-138 Afib with brief increase into the 140's with activity.       ADL  Eating/Feeding: Independent Where Assessed - Eating/Feeding: Chair Grooming: Wash/dry hands;Wash/dry face;Brushing hair;Min guard Where Assessed - Grooming: Supported standing Upper Body Bathing: Set up Where Assessed - Upper Body Bathing: Unsupported sitting;Supported sitting Lower Body Bathing: Minimal assistance Where Assessed - Lower Body Bathing: Supported sit to stand Upper Body Dressing: Set up Where  Assessed - Upper Body Dressing: Unsupported sitting Lower Body Dressing: Minimal assistance Where Assessed - Lower Body Dressing: Supported sit to Lobbyist: Magazine features editor Method: Sit to stand;Stand pivot Science writer: Comfort height toilet Toileting - Water quality scientist and Hygiene: Min guard Where Assessed - Best boy and Hygiene: Standing Equipment Used: Rolling walker Transfers/Ambulation Related to ADLs: min guard assist ADL Comments: Pt noted to lose balance when stepping backwards requiring min A to correct.  Pt states it is due to edema in LEs    OT Diagnosis: Generalized weakness  OT Problem List: Decreased strength;Decreased activity tolerance;Impaired balance (sitting and/or standing);Decreased safety awareness;Decreased knowledge of use of DME or AE OT Treatment Interventions: Self-care/ADL training;DME and/or AE instruction;Therapeutic activities;Patient/family education;Balance training   OT Goals(Current goals can be found in the care plan section) Acute Rehab OT Goals Patient Stated Goal: To get better OT Goal Formulation: With patient Time For Goal Achievement: 07/16/13 Potential to Achieve Goals: Good ADL Goals Pt Will Perform Grooming: with modified independence;standing Pt Will Perform Upper Body Bathing: with modified independence;sitting Pt Will Perform Lower Body Bathing: with modified independence;sit to/from stand Pt Will Perform Upper Body Dressing: with modified independence;sitting Pt Will Perform Lower Body Dressing: with modified independence;sit to/from stand Pt Will Transfer to Toilet: with modified independence;ambulating;regular height toilet;grab bars Pt Will Perform Toileting - Clothing Manipulation and hygiene: with modified independence;sit to/from stand Pt Will Perform Tub/Shower Transfer: Tub transfer;with modified independence;ambulating;rolling walker  Visit Information  Last OT  Received On: 07/02/13 History of Present Illness: THis 79 y.o. male with known LV dysfunction admitted with increasing dyspnea and increased LE edema.  Pt found to be in Afib with RVR       Prior [place] expects to be discharged to:: Private residence Living Arrangements: Spouse/significant other (lady friend ~17 days/month) Available Help at Discharge: Friend(s);Available PRN/intermittently Type of Home: House Home Access: Level entry Home Layout: One level Home Equipment: None Prior Function Level of Independence: Independent Comments: Pt admites to one fall in the last 2 wks that he attributes to increased edema in LEs Communication Communication: No difficulties Dominant Hand: Right         Vision/Perception     Cognition  Cognition Arousal/Alertness: Awake/alert Behavior During Therapy: WFL for tasks assessed/performed Overall Cognitive Status: Within Functional Limits for tasks assessed    Extremity/Trunk Assessment Upper Extremity Assessment Upper Extremity Assessment: Overall WFL for tasks assessed - Pt with h/o tremors likely Parkinson's Lower Extremity Assessment Lower Extremity Assessment: Defer to PT evaluation Cervical / Trunk Assessment Cervical / Trunk Assessment: Normal     Mobility Bed Mobility Bed Mobility: Supine to Sit;Sitting - Scoot to Edge of Bed Supine to Sit: 5: Supervision Sitting - Scoot to Edge of Bed: 5: Supervision Transfers Transfers: Sit to Stand;Stand to Sit Sit to Stand: 4: Min guard;With upper extremity assist;From bed Stand to Sit: 4: Min guard;With upper extremity assist;To chair/3-in-1 Details for Transfer Assistance: Requires min guard assist for balance     Exercise     Balance Balance Balance Assessed: Yes Static Standing Balance Static Standing - Balance Support: No upper extremity supported Static Standing - Level of Assistance: 4: Min assist Static Standing - Comment/# of  Minutes: loses balance posteriorly requiring min A to correct   End of Session OT - End of Session Equipment Utilized During Treatment: Rolling walker Activity Tolerance: Patient tolerated treatment well Patient left: in chair;with call bell/phone within reach;with nursing/sitter in room Nurse Communication: Mobility status  GO     Ilithyia Titzer M 07/02/2013, 4:20 PM

## 2013-07-02 NOTE — Progress Notes (Addendum)
ANTICOAGULATION CONSULT NOTE - Initial Consult  Pharmacy Consult for Coumadin Indication: atrial fibrillation  Allergies  Allergen Reactions  . Amiodarone Other (See Comments)    Amiodarone induced hepatitis    Patient Measurements: Height: 5\' 9"  (175.3 cm) Weight: 167 lb 1.7 oz (75.8 kg) IBW/kg (Calculated) : 70.7  Vital Signs: Temp: 98 F (36.7 C) (12/24 1200) Temp src: Oral (12/24 1200) BP: 98/62 mmHg (12/24 1200) Pulse Rate: 110 (12/24 0800)  Labs:  Recent Labs  06/29/13 1840 06/29/13 2045 06/30/13 0440 07/01/13 0425 07/02/13 0520  HGB 14.1  --   --   --   --   HCT 42.8  --   --   --   --   PLT 109*  --   --   --   --   APTT 70*  --   --   --   --   LABPROT 63.4*  --   --  42.8* 27.8*  INR 7.97*  --   --  4.76* 2.71*  CREATININE 1.52*  --  1.28 1.28 1.25  TROPONINI  --  <0.30  --   --   --     Estimated Creatinine Clearance: 44 ml/min (by C-G formula based on Cr of 1.25).   Medical History: Past Medical History  Diagnosis Date  . Atrial fibrillation     echo- EF 35-40%; LA severely dilated; RA mod dilated, RV systolic pressure 0000000; LV systolic fcn mod reduced  . Atrial fibrillation     stress test - post stress EF 28%, AFib w/ RVR and LBBB, apical and septal thinning; increased RV free wall uptake consistent w/ pressure and/or volume overload  . Aortic valvular stenoses     not symtomatic  . SSS (sick sinus syndrome)     pacemaker placed in 1993  . Diabetes mellitus without complication     Medications:  See electronic med rec  Assessment: 79 y.o. male presents with edema, SOB, rapid afib. Pt on coumadin PTA for afib. Baseline INR 7.97. CBC stable, plt low. No overt bleeding noted. INR down to 2.71 today. Plan for pacer on Friday. Dw Dr. Lovena Le, will hold coumadin for now and let INR drops down further bc the pt may get cath also. May need heparin bridging when INR is <2. Will check with card at that time.   Goal of Therapy:  INR 2-3 Monitor  platelets by anticoagulation protocol: Yes   Plan:   Hold coumadin F/u if IV heparin is needed

## 2013-07-02 NOTE — Progress Notes (Signed)
Patient ID: [name] [name], male   DOB: 10/04/1926, 79 y.o.   MRN: [id number] Subjective:  Dyspnea stable but present. Feels palpitations.  Objective:  Vital Signs in the last 24 hours: Temp:  [97.1 F (36.2 C)-98.1 F (36.7 C)] 98.1 F (36.7 C) (12/24 0800) Pulse Rate:  [60-142] 110 (12/24 0800) Resp:  [17-29] 17 (12/24 0400) BP: (89-141)/(59-95) 141/95 mmHg (12/24 0800) SpO2:  [95 %-100 %] 95 % (12/24 0800) Weight:  [167 lb 1.7 oz (75.8 kg)] 167 lb 1.7 oz (75.8 kg) (12/24 0400)  Intake/Output from previous day: 12/23 0701 - 12/24 0700 In: 725 [P.O.:600; I.V.:125] Out: 725 [Urine:725] Intake/Output from this shift: Total I/O In: 5 [I.V.:5] Out: -   Physical Exam: Well appearing NAD HEENT: Unremarkable Neck:  No JVD, no thyromegally Back:  No CVA tenderness Lungs:  Clear with no wheezes HEART:  IRegular tachy rhythm, no murmurs, no rubs, no clicks Abd:  Flat, positive bowel sounds, no organomegally, no rebound, no guarding Ext:  2 plus pulses, no edema, no cyanosis, no clubbing Skin:  No rashes no nodules Neuro:  CN II through XII intact, motor grossly intact  Lab Results:  Recent Labs  06/29/13 1840  WBC 16.3*  HGB 14.1  PLT 109*    Recent Labs  07/01/13 0425 07/02/13 0520  NA 136 139  K 4.7 3.9  CL 104 105  CO2 20 24  GLUCOSE 116* 108*  BUN 45* 40*  CREATININE 1.28 1.25    Recent Labs  06/29/13 2045  TROPONINI <0.30   Hepatic Function Panel  Recent Labs  06/29/13 1840  PROT 6.2  ALBUMIN 3.0*  AST 32  ALT 20  ALKPHOS 58  BILITOT 1.6*   No results found for this basename: CHOL,  in the last 72 hours No results found for this basename: PROTIME,  in the last 72 hours  Imaging: No results found.  Cardiac Studies: Tele - atrial fib with an RVR Assessment/Plan:  1. Atrial fib with an RVR 2. Acute on chronic systolic/diastolic CHF - plan BiV PPM and AV node ablation on Friday unless complications arise.  LOS: 3 days    Gregg  Taylor,M.D. 07/02/2013, 9:16 AM

## 2013-07-02 NOTE — Progress Notes (Signed)
Subjective: Still mildly SOB. Not requiring Wallace. Denies CP, palpitations and dizziness.   Objective: Vital signs in last 24 hours: Temp:  [98 F (36.7 C)-98.1 F (36.7 C)] 98 F (36.7 C) (12/24 1200) Pulse Rate:  [59-110] 110 (12/24 0800) Resp:  [17-23] 17 (12/24 1200) BP: (98-141)/(59-95) 98/62 mmHg (12/24 1200) SpO2:  [93 %-98 %] 93 % (12/24 1200) Weight:  [167 lb 1.7 oz (75.8 kg)] 167 lb 1.7 oz (75.8 kg) (12/24 0400) Last BM Date: 07/01/13  Intake/Output from previous day: 12/23 0701 - 12/24 0700 In: 725 [P.O.:600; I.V.:125] Out: 725 [Urine:725] Intake/Output this shift: Total I/O In: 5 [I.V.:5] Out: -   Medications Current Facility-Administered Medications  Medication Dose Route Frequency Provider Last Rate Last Dose  . 0.9 %  sodium chloride infusion  10 mL/hr Intravenous Continuous Mihai Croitoru, MD 10 mL/hr at 07/02/13 0900 10 mL/hr at 07/02/13 0900  . acetaminophen (TYLENOL) tablet 650 mg  650 mg Oral Q4H PRN Jacolyn Reedy, MD      . carbidopa-levodopa (SINEMET IR) 25-100 MG per tablet immediate release 1 tablet  1 tablet Oral TID Jacolyn Reedy, MD   1 tablet at 07/02/13 1052  . digoxin (LANOXIN) tablet 0.125 mg  0.125 mg Oral Daily Jacolyn Reedy, MD   0.125 mg at 07/02/13 1052  . diltiazem (CARDIZEM) 100 mg in dextrose 5 % 100 mL infusion  5-15 mg/hr Intravenous Titrated Donita Brooks, MD 10 mL/hr at 07/02/13 1429 10 mg/hr at 07/02/13 1429  . furosemide (LASIX) tablet 40 mg  40 mg Oral Daily Mihai Croitoru, MD   40 mg at 07/02/13 1052  . insulin aspart (novoLOG) injection 0-9 Units  0-9 Units Subcutaneous TID WC Jacolyn Reedy, MD   2 Units at 07/02/13 1255  . metoprolol (LOPRESSOR) injection 5 mg  5 mg Intravenous Q6H PRN Jacolyn Reedy, MD      . metoprolol succinate (TOPROL-XL) 24 hr tablet 50 mg  50 mg Oral Daily Jacolyn Reedy, MD   50 mg at 07/02/13 1052  . potassium chloride (K-DUR,KLOR-CON) CR tablet 10 mEq  10 mEq Oral Daily Mihai  Croitoru, MD   10 mEq at 07/02/13 1052  . sodium chloride 0.9 % injection 3 mL  3 mL Intravenous Q12H Mihai Croitoru, MD   3 mL at 07/02/13 1052  . sodium chloride 0.9 % injection 3 mL  3 mL Intravenous PRN Sanda Klein, MD      . Warfarin - Pharmacist Dosing Inpatient   Does not apply q1800 Sindy [place], [facility]        PE: General appearance: alert, cooperative and no distress Neck: no JVD Lungs: faint bibasilar rales Heart: irregularly irregular rhythm and 2/6 murmur best heard at apex Extremities: 3+ bilateral pitting edema Pulses: 2+ and symmetric Skin: warm and dry Neurologic: Grossly normal  Lab Results:   Recent Labs  06/29/13 1840  WBC 16.3*  HGB 14.1  HCT 42.8  PLT 109*   BMET  Recent Labs  06/30/13 0440 07/01/13 0425 07/02/13 0520  NA 136 136 139  K 4.0 4.7 3.9  CL 102 104 105  CO2 23 20 24   GLUCOSE 126* 116* 108*  BUN 48* 45* 40*  CREATININE 1.28 1.28 1.25  CALCIUM 8.0* 7.8* 7.9*   PT/INR  Recent Labs  06/29/13 1840 07/01/13 0425 07/02/13 0520  LABPROT 63.4* 42.8* 27.8*  INR 7.97* 4.76* 2.71*     Assessment/Plan    Principal Problem:   Atrial fibrillation  Active Problems:   Acute on chronic systolic congestive heart failure   Presence of permanent cardiac pacemaker   Aortic stenosis   Current use of long term anticoagulation   Chronic kidney disease stage III   Diabetes mellitus without complication  Plan: still in a-fib. Ventricular rate improved with HR in the 90s. EP consult earlier today. Plan is for BiV PPM upgrade on Friday with Dr. Lovena Le. Will continue on IV Cardizem for now as well as BB, as BP allows. He will require additional diuresis. He continues to have faint bibasilar rales and bilateral LEE. Continue on PO Lasix. SBP is soft in the low 90s. With the exception of mild SOB, he is otherwise asymptomatic. Will need to monitor BP closely with Cardizem, Toprol and Lasix. INR is therapeutic.    LOS: 3 days    Brittainy M.  Ladoris Gene 07/02/2013 3:04 PM  Agree with note written by Ellen Henri  PAC  Diuresing. AFIB/TBS. Dr. Lovena Le to upgrade PTVPM Fri. Exam benign. BP soft. Lungs clear, 2+ pitting edema. INR theraputic  Lorretta Harp 07/02/2013 4:10 PM

## 2013-07-03 LAB — GLUCOSE, CAPILLARY
Glucose-Capillary: 157 mg/dL — ABNORMAL HIGH (ref 70–99)
Glucose-Capillary: 119 mg/dL — ABNORMAL HIGH (ref 70–99)

## 2013-07-03 LAB — BASIC METABOLIC PANEL
BUN: 37 mg/dL — ABNORMAL HIGH (ref 6–23)
Calcium: 7.9 mg/dL — ABNORMAL LOW (ref 8.4–10.5)
GFR calc non Af Amer: 53 mL/min — ABNORMAL LOW (ref >90)
Glucose, Bld: 117 mg/dL — ABNORMAL HIGH (ref 70–99)

## 2013-07-03 LAB — PROTIME-INR: Prothrombin Time: 21.4 seconds — ABNORMAL HIGH (ref 11.6–15.2)

## 2013-07-03 MED ORDER — CEFAZOLIN SODIUM-DEXTROSE 2-3 GM-% IV SOLR
2.0000 g | INTRAVENOUS | Status: DC
  Filled 2013-07-03: qty 50

## 2013-07-03 MED ORDER — SODIUM CHLORIDE 0.9 % IV SOLN
INTRAVENOUS | Status: DC
  Administered 2013-07-03: 18:00:00 via INTRAVENOUS

## 2013-07-03 MED ORDER — WARFARIN SODIUM 2 MG PO TABS
2.0000 mg | ORAL_TABLET | Freq: Once | ORAL | Status: AC
  Administered 2013-07-03: 2 mg via ORAL
  Filled 2013-07-03: qty 1

## 2013-07-03 MED ORDER — SODIUM CHLORIDE 0.9 % IR SOLN
80.0000 mg | Status: DC
  Filled 2013-07-03: qty 2

## 2013-07-03 NOTE — Progress Notes (Signed)
Subjective:  No CP/SOB  Objective:  Temp:  [97.2 F (36.2 C)-98.2 F (36.8 C)] 98 F (36.7 C) (12/25 0831) Pulse Rate:  [80-140] 94 (12/25 0831) Resp:  [13-29] 20 (12/25 0831) BP: (91-155)/(62-83) 155/70 mmHg (12/25 0831) SpO2:  [93 %-98 %] 98 % (12/25 0831) Weight:  [169 lb 12.1 oz (77 kg)] 169 lb 12.1 oz (77 kg) (12/25 0354) Weight change: 2 lb 10.3 oz (1.2 kg)  Intake/Output from previous day: 12/24 0701 - 12/25 0700 In: 621.3 [P.O.:440; I.V.:181.3] Out: 825 [Urine:825]  Intake/Output from this shift: Total I/O In: 241.7 [I.V.:241.7] Out: -   Physical Exam: General appearance: alert and no distress Neck: no adenopathy, no carotid bruit, no JVD, supple, symmetrical, trachea midline and thyroid not enlarged, symmetric, no tenderness/mass/nodules Lungs: clear to auscultation bilaterally Heart: irregularly irregular rhythm Extremities: extremities normal, atraumatic, no cyanosis or edema  Lab Results: Results for orders placed during the hospital encounter of 06/29/13 (from the past 48 hour(s))  GLUCOSE, CAPILLARY     Status: Abnormal   Collection Time    07/01/13 11:55 AM      Result Value Range   Glucose-Capillary 151 (*) 70 - 99 mg/dL  GLUCOSE, CAPILLARY     Status: Abnormal   Collection Time    07/01/13  4:06 PM      Result Value Range   Glucose-Capillary 157 (*) 70 - 99 mg/dL   Comment 1 Notify RN    GLUCOSE, CAPILLARY     Status: Abnormal   Collection Time    07/01/13  8:25 PM      Result Value Range   Glucose-Capillary 135 (*) 70 - 99 mg/dL   Comment 1 Notify RN    BASIC METABOLIC PANEL     Status: Abnormal   Collection Time    07/02/13  5:20 AM      Result Value Range   Sodium 139  135 - 145 mEq/L   Potassium 3.9  3.5 - 5.1 mEq/L   Chloride 105  96 - 112 mEq/L   CO2 24  19 - 32 mEq/L   Glucose, Bld 108 (*) 70 - 99 mg/dL   BUN 40 (*) 6 - 23 mg/dL   Creatinine, Ser 1.25  0.50 - 1.35 mg/dL   Calcium 7.9 (*) 8.4 - 10.5 mg/dL   GFR calc non Af  Amer 51 (*) >90 mL/min   GFR calc Af Amer 59 (*) >90 mL/min   Comment: (NOTE)     The eGFR has been calculated using the CKD EPI equation.     This calculation has not been validated in all clinical situations.     eGFR's persistently <90 mL/min signify possible Chronic Kidney     Disease.  PROTIME-INR     Status: Abnormal   Collection Time    07/02/13  5:20 AM      Result Value Range   Prothrombin Time 27.8 (*) 11.6 - 15.2 seconds   INR 2.71 (*) 0.00 - 1.49  PRO B NATRIURETIC PEPTIDE     Status: Abnormal   Collection Time    07/02/13  5:20 AM      Result Value Range   Pro B Natriuretic peptide (BNP) 15424.0 (*) 0 - 450 pg/mL  GLUCOSE, CAPILLARY     Status: None   Collection Time    07/02/13  8:32 AM      Result Value Range   Glucose-Capillary 93  70 - 99 mg/dL  GLUCOSE, CAPILLARY  Status: Abnormal   Collection Time    07/02/13 12:23 PM      Result Value Range   Glucose-Capillary 182 (*) 70 - 99 mg/dL  GLUCOSE, CAPILLARY     Status: Abnormal   Collection Time    07/02/13  4:55 PM      Result Value Range   Glucose-Capillary 123 (*) 70 - 99 mg/dL  GLUCOSE, CAPILLARY     Status: Abnormal   Collection Time    07/02/13  9:15 PM      Result Value Range   Glucose-Capillary 136 (*) 70 - 99 mg/dL  BASIC METABOLIC PANEL     Status: Abnormal   Collection Time    07/03/13  4:15 AM      Result Value Range   Sodium 140  135 - 145 mEq/L   Potassium 3.9  3.5 - 5.1 mEq/L   Chloride 104  96 - 112 mEq/L   CO2 27  19 - 32 mEq/L   Glucose, Bld 117 (*) 70 - 99 mg/dL   BUN 37 (*) 6 - 23 mg/dL   Creatinine, Ser 1.22  0.50 - 1.35 mg/dL   Calcium 7.9 (*) 8.4 - 10.5 mg/dL   GFR calc non Af Amer 53 (*) >90 mL/min   GFR calc Af Amer 61 (*) >90 mL/min   Comment: (NOTE)     The eGFR has been calculated using the CKD EPI equation.     This calculation has not been validated in all clinical situations.     eGFR's persistently <90 mL/min signify possible Chronic Kidney     Disease.    PROTIME-INR     Status: Abnormal   Collection Time    07/03/13  4:15 AM      Result Value Range   Prothrombin Time 21.4 (*) 11.6 - 15.2 seconds   INR 1.92 (*) 0.00 - 1.49  GLUCOSE, CAPILLARY     Status: Abnormal   Collection Time    07/03/13  9:06 AM      Result Value Range   Glucose-Capillary 160 (*) 70 - 99 mg/dL    Imaging: Imaging results have been reviewed  Assessment/Plan:   1. Principal Problem: 2.   Atrial fibrillation 3. Active Problems: 4.   Acute on chronic systolic congestive heart failure 5.   Presence of permanent cardiac pacemaker 6.   Aortic stenosis 7.   Current use of long term anticoagulation 8.   Chronic kidney disease stage III 9.   Diabetes mellitus without complication 10.   Time Spent Directly with Patient:  15 minutes  Length of Stay:  LOS: 4 days   Remains in AFIB with RVR. INR dipped below 2.0 and Coumadin restarted by Dr. Lovena Le. For BiV ICD up grade tomorrow with AVN ablation. No change in Rx today.Exam benign.   Lorretta Harp 07/03/2013, 9:25 AM

## 2013-07-03 NOTE — Progress Notes (Signed)
Patient ID: [name] [name], male   DOB: 10-Jun-1927, 79 y.o.   MRN: [id number] Subjective:  Dyspnea improved. No chest pain Objective:  Vital Signs in the last 24 hours: Temp:  [97.2 F (36.2 C)-98.2 F (36.8 C)] 98 F (36.7 C) (12/25 0831) Pulse Rate:  [80-140] 94 (12/25 0831) Resp:  [13-29] 20 (12/25 0831) BP: (91-155)/(62-83) 155/70 mmHg (12/25 0831) SpO2:  [93 %-98 %] 98 % (12/25 0831) Weight:  [169 lb 12.1 oz (77 kg)] 169 lb 12.1 oz (77 kg) (12/25 0354)  Intake/Output from previous day: 12/24 0701 - 12/25 0700 In: 621.3 [P.O.:440; I.V.:181.3] Out: 825 [Urine:825] Intake/Output from this shift:    Physical Exam: stable appearing elderly man, NAD HEENT: Unremarkable Neck: 7 cm JVD, no thyromegally Back:  No CVA tenderness Lungs:  Clear with no wheezes HEART:  IRegular tachy rhythm, no murmurs, no rubs, no clicks Abd:  soft, positive bowel sounds, no organomegally, no rebound, no guarding Ext:  2 plus pulses, no edema, no cyanosis, no clubbing Skin:  No rashes no nodules Neuro:  CN II through XII intact, motor grossly intact  Lab Results: No results found for this basename: WBC, HGB, PLT,  in the last 72 hours  Recent Labs  07/02/13 0520 07/03/13 0415  NA 139 140  K 3.9 3.9  CL 105 104  CO2 24 27  GLUCOSE 108* 117*  BUN 40* 37*  CREATININE 1.25 1.22   No results found for this basename: TROPONINI, CK, MB,  in the last 72 hours Hepatic Function Panel No results found for this basename: PROT, ALBUMIN, AST, ALT, ALKPHOS, BILITOT, BILIDIR, IBILI,  in the last 72 hours No results found for this basename: CHOL,  in the last 72 hours No results found for this basename: PROTIME,  in the last 72 hours  Imaging: No results found.  Cardiac Studies: Tele - atrial fib with a RVR Assessment/Plan:  1. Atrial fib with an RVR 2. Acute on chronic systolic CHF 3. Chronic anti-coagulation Rec: will restart coumadin. Will plan BiV PPM upgrade and AV Node ablation tomorrow. I  have discussed the indications for, risks/benefits/goals/expectations of the procedure with the patient and he wishes to proceed.  LOS: 4 days    Gregg Taylor,M.D. 07/03/2013, 8:36 AM

## 2013-07-04 ENCOUNTER — Encounter (HOSPITAL_COMMUNITY)
Admission: EM | Disposition: A | Discharge status: Home Health | Payer: Self-pay | Source: Home / Self Care | Attending: Cardiovascular Disease

## 2013-07-04 DIAGNOSIS — I5022 Chronic systolic (congestive) heart failure: Secondary | ICD-10-CM

## 2013-07-04 DIAGNOSIS — I498 Other specified cardiac arrhythmias: Secondary | ICD-10-CM

## 2013-07-04 DIAGNOSIS — I4891 Unspecified atrial fibrillation: Secondary | ICD-10-CM

## 2013-07-04 HISTORY — PX: AV NODE ABLATION: SHX5458

## 2013-07-04 HISTORY — PX: BI-VENTRICULAR PACEMAKER INSERTION: SHX5462

## 2013-07-04 LAB — PROTIME-INR
INR: 1.59 — ABNORMAL HIGH (ref 0.00–1.49)
Prothrombin Time: 18.5 seconds — ABNORMAL HIGH (ref 11.6–15.2)

## 2013-07-04 LAB — BASIC METABOLIC PANEL
BUN: 35 mg/dL — ABNORMAL HIGH (ref 6–23)
Chloride: 109 mEq/L (ref 96–112)
Creatinine, Ser: 1.25 mg/dL (ref 0.50–1.35)
GFR calc Af Amer: 59 mL/min — ABNORMAL LOW (ref >90)
Glucose, Bld: 109 mg/dL — ABNORMAL HIGH (ref 70–99)
Potassium: 4.6 mEq/L (ref 3.5–5.1)
Sodium: 145 mEq/L (ref 135–145)

## 2013-07-04 LAB — MAGNESIUM: Magnesium: 2 mg/dL (ref 1.5–2.5)

## 2013-07-04 LAB — GLUCOSE, CAPILLARY
Glucose-Capillary: 101 mg/dL — ABNORMAL HIGH (ref 70–99)
Glucose-Capillary: 192 mg/dL — ABNORMAL HIGH (ref 70–99)

## 2013-07-04 SURGERY — AV NODE ABLATION
Anesthesia: Moderate Sedation | Laterality: Bilateral

## 2013-07-04 MED ORDER — FENTANYL CITRATE 0.05 MG/ML IJ SOLN
INTRAMUSCULAR | Status: AC
  Filled 2013-07-04: qty 2

## 2013-07-04 MED ORDER — LIDOCAINE HCL (PF) 1 % IJ SOLN
INTRAMUSCULAR | Status: AC
  Filled 2013-07-04: qty 60

## 2013-07-04 MED ORDER — MIDAZOLAM HCL 5 MG/5ML IJ SOLN
INTRAMUSCULAR | Status: AC
  Filled 2013-07-04: qty 5

## 2013-07-04 MED ORDER — WARFARIN SODIUM 2.5 MG PO TABS
2.5000 mg | ORAL_TABLET | Freq: Once | ORAL | Status: AC
  Administered 2013-07-04: 2.5 mg via ORAL
  Filled 2013-07-04 (×2): qty 1

## 2013-07-04 MED ORDER — BUPIVACAINE HCL (PF) 0.25 % IJ SOLN
INTRAMUSCULAR | Status: AC
  Filled 2013-07-04: qty 30

## 2013-07-04 MED ORDER — CEFAZOLIN SODIUM-DEXTROSE 2-3 GM-% IV SOLR
2.0000 g | Freq: Four times a day (QID) | INTRAVENOUS | Status: AC
  Administered 2013-07-04 – 2013-07-05 (×3): 2 g via INTRAVENOUS
  Filled 2013-07-04 (×3): qty 50

## 2013-07-04 MED ORDER — CEFAZOLIN SODIUM-DEXTROSE 2-3 GM-% IV SOLR
2.0000 g | Freq: Four times a day (QID) | INTRAVENOUS | Status: DC
  Filled 2013-07-04 (×3): qty 50

## 2013-07-04 MED ORDER — ACETAMINOPHEN 325 MG PO TABS: 325.0000 mg | ORAL_TABLET | ORAL | Status: DC | PRN

## 2013-07-04 MED ORDER — ONDANSETRON HCL 4 MG/2ML IJ SOLN: 4.0000 mg | Freq: Four times a day (QID) | INTRAMUSCULAR | Status: DC | PRN

## 2013-07-04 NOTE — Interval H&P Note (Signed)
History and Physical Interval Note:Since prior rounding note, no change in the history, physical exam, assessment and plan.  07/04/2013 1:48 PM  [name] [name]  has presented today for surgery, with the diagnosis of chf  The various methods of treatment have been discussed with the patient and family. After consideration of risks, benefits and other options for treatment, the patient has consented to  Procedure(s): AV NODE ABLATION (Bilateral) BI-VENTRICULAR PACEMAKER INSERTION (CRT-P) (Bilateral) as a surgical intervention .  The patient's history has been reviewed, patient examined, no change in status, stable for surgery.  I have reviewed the patient's chart and labs.  Questions were answered to the patient's satisfaction.     Mikle Bosworth.D.

## 2013-07-04 NOTE — CV Procedure (Signed)
BiV PPM insertion via the left subclavian vein with AV Node ablation without immediate complication. [id number].

## 2013-07-04 NOTE — Progress Notes (Signed)
Coumadin per pharmacy  62 yoM with history of LV dysfunction EF 35-40% by ECHO in 09/2012, aortic stenosis, and afib on chronic warfarin. Recently treated with antibiotics for bronchitis. Two week history of worsening edema and found to be SOB in ED with rapid afib.   Anticoagulation: Pt on coumadin PTA 5mg  daily for afib. admit INR 7.97- now has dropped to 1.59 from 1.92. Was holding for possibility of cath- Dr. Lovena Le ordered 2mg  x1 on 12/25- planning for BiV ICD with AVN ablation 12/26  Best Practices: Coumadin  Plan:  -warfarin 2.5mg  po x1 tonight -daily INR

## 2013-07-04 NOTE — H&P (View-Only) (Signed)
Patient ID: [name] [name], male   DOB: [birth year], 79 y.o.   MRN: [id number] Subjective:  Dyspnea improved. No chest pain Objective:  Vital Signs in the last 24 hours: Temp:  [97.2 F (36.2 C)-98.2 F (36.8 C)] 98 F (36.7 C) (12/25 0831) Pulse Rate:  [80-140] 94 (12/25 0831) Resp:  [13-29] 20 (12/25 0831) BP: (91-155)/(62-83) 155/70 mmHg (12/25 0831) SpO2:  [93 %-98 %] 98 % (12/25 0831) Weight:  [169 lb 12.1 oz (77 kg)] 169 lb 12.1 oz (77 kg) (12/25 0354)  Intake/Output from previous day: 12/24 0701 - 12/25 0700 In: 621.3 [P.O.:440; I.V.:181.3] Out: 825 [Urine:825] Intake/Output from this shift:    Physical Exam: stable appearing elderly man, NAD HEENT: Unremarkable Neck: 7 cm JVD, no thyromegally Back:  No CVA tenderness Lungs:  Clear with no wheezes HEART:  IRegular tachy rhythm, no murmurs, no rubs, no clicks Abd:  soft, positive bowel sounds, no organomegally, no rebound, no guarding Ext:  2 plus pulses, no edema, no cyanosis, no clubbing Skin:  No rashes no nodules Neuro:  CN II through XII intact, motor grossly intact  Lab Results: No results found for this basename: WBC, HGB, PLT,  in the last 72 hours  Recent Labs  07/02/13 0520 07/03/13 0415  NA 139 140  K 3.9 3.9  CL 105 104  CO2 24 27  GLUCOSE 108* 117*  BUN 40* 37*  CREATININE 1.25 1.22   No results found for this basename: TROPONINI, CK, MB,  in the last 72 hours Hepatic Function Panel No results found for this basename: PROT, ALBUMIN, AST, ALT, ALKPHOS, BILITOT, BILIDIR, IBILI,  in the last 72 hours No results found for this basename: CHOL,  in the last 72 hours No results found for this basename: PROTIME,  in the last 72 hours  Imaging: No results found.  Cardiac Studies: Tele - atrial fib with a RVR Assessment/Plan:  1. Atrial fib with an RVR 2. Acute on chronic systolic CHF 3. Chronic anti-coagulation Rec: will restart coumadin. Will plan BiV PPM upgrade and AV Node ablation tomorrow. I  have discussed the indications for, risks/benefits/goals/expectations of the procedure with the patient and he wishes to proceed.  LOS: 4 days    Thamar Holik,M.D. 07/03/2013, 8:36 AM

## 2013-07-04 NOTE — Progress Notes (Signed)
Subjective:  No change since yesterday. Denies CP/SOB  Objective:  Temp:  [97.4 F (36.3 C)-98.4 F (36.9 C)] 97.4 F (36.3 C) (12/26 0721) Pulse Rate:  [56-137] 137 (12/26 0721) Resp:  [17-29] 27 (12/26 0721) BP: (100-117)/(64-77) 110/75 mmHg (12/26 0721) SpO2:  [90 %-97 %] 97 % (12/26 0721) Weight:  [165 lb 9.1 oz (75.1 kg)] 165 lb 9.1 oz (75.1 kg) (12/26 0500) Weight change: -4 lb 3 oz (-1.9 kg)  Intake/Output from previous day: 12/25 0701 - 12/26 0700 In: 1808 [P.O.:360; I.V.:1448] Out: 1001 [Urine:1000; Stool:1]  Intake/Output from this shift:    Physical Exam: General appearance: alert and no distress Neck: no adenopathy, no carotid bruit, no JVD, supple, symmetrical, trachea midline and thyroid not enlarged, symmetric, no tenderness/mass/nodules Lungs: clear to auscultation bilaterally Heart: irregularly irregular rhythm Extremities: extremities normal, atraumatic, no cyanosis or edema  Lab Results: Results for orders placed during the hospital encounter of 06/29/13 (from the past 48 hour(s))  GLUCOSE, CAPILLARY     Status: Abnormal   Collection Time    07/02/13 12:23 PM      Result Value Range   Glucose-Capillary 182 (*) 70 - 99 mg/dL  GLUCOSE, CAPILLARY     Status: Abnormal   Collection Time    07/02/13  4:55 PM      Result Value Range   Glucose-Capillary 123 (*) 70 - 99 mg/dL  GLUCOSE, CAPILLARY     Status: Abnormal   Collection Time    07/02/13  9:15 PM      Result Value Range   Glucose-Capillary 136 (*) 70 - 99 mg/dL  BASIC METABOLIC PANEL     Status: Abnormal   Collection Time    07/03/13  4:15 AM      Result Value Range   Sodium 140  135 - 145 mEq/L   Potassium 3.9  3.5 - 5.1 mEq/L   Chloride 104  96 - 112 mEq/L   CO2 27  19 - 32 mEq/L   Glucose, Bld 117 (*) 70 - 99 mg/dL   BUN 37 (*) 6 - 23 mg/dL   Creatinine, Ser 1.22  0.50 - 1.35 mg/dL   Calcium 7.9 (*) 8.4 - 10.5 mg/dL   GFR calc non Af Amer 53 (*) >90 mL/min   GFR calc Af Amer 61  (*) >90 mL/min   Comment: (NOTE)     The eGFR has been calculated using the CKD EPI equation.     This calculation has not been validated in all clinical situations.     eGFR's persistently <90 mL/min signify possible Chronic Kidney     Disease.  PROTIME-INR     Status: Abnormal   Collection Time    07/03/13  4:15 AM      Result Value Range   Prothrombin Time 21.4 (*) 11.6 - 15.2 seconds   INR 1.92 (*) 0.00 - 1.49  GLUCOSE, CAPILLARY     Status: Abnormal   Collection Time    07/03/13  9:06 AM      Result Value Range   Glucose-Capillary 160 (*) 70 - 99 mg/dL  GLUCOSE, CAPILLARY     Status: Abnormal   Collection Time    07/03/13 12:00 PM      Result Value Range   Glucose-Capillary 157 (*) 70 - 99 mg/dL  GLUCOSE, CAPILLARY     Status: Abnormal   Collection Time    07/03/13  3:58 PM      Result Value Range  Glucose-Capillary 190 (*) 70 - 99 mg/dL  GLUCOSE, CAPILLARY     Status: Abnormal   Collection Time    07/03/13  9:26 PM      Result Value Range   Glucose-Capillary 119 (*) 70 - 99 mg/dL  BASIC METABOLIC PANEL     Status: Abnormal   Collection Time    07/04/13  6:05 AM      Result Value Range   Sodium 145  135 - 145 mEq/L   Potassium 4.6  3.5 - 5.1 mEq/L   Chloride 109  96 - 112 mEq/L   CO2 27  19 - 32 mEq/L   Glucose, Bld 109 (*) 70 - 99 mg/dL   BUN 35 (*) 6 - 23 mg/dL   Creatinine, Ser 1.25  0.50 - 1.35 mg/dL   Calcium 7.7 (*) 8.4 - 10.5 mg/dL   GFR calc non Af Amer 51 (*) >90 mL/min   GFR calc Af Amer 59 (*) >90 mL/min   Comment: (NOTE)     The eGFR has been calculated using the CKD EPI equation.     This calculation has not been validated in all clinical situations.     eGFR's persistently <90 mL/min signify possible Chronic Kidney     Disease.  PROTIME-INR     Status: Abnormal   Collection Time    07/04/13  6:05 AM      Result Value Range   Prothrombin Time 18.5 (*) 11.6 - 15.2 seconds   INR 1.59 (*) 0.00 - 1.49    Imaging: Imaging results have been  reviewed  Assessment/Plan:   1. Principal Problem: 2.   Atrial fibrillation 3. Active Problems: 4.   Acute on chronic systolic congestive heart failure 5.   Presence of permanent cardiac pacemaker 6.   Aortic stenosis 7.   Current use of long term anticoagulation 8.   Chronic kidney disease stage III 9.   Diabetes mellitus without complication 10.   Time Spent Directly with Patient:  20 minutes  Length of Stay:  LOS: 5 days   For BiV upgrade and AVN ablation for TBS and tachycardia. Exam benign. Labs OK except falling INR. Procedure scheduled today with Dr. Lovena Le.  Lorretta Harp 07/04/2013, 8:41 AM

## 2013-07-04 NOTE — Evaluation (Signed)
Physical Therapy Evaluation Patient Details Name: [name] [name] MRN: [id number] DOB: Nov 08, 1926 Today's Date: 07/04/2013 Time: 0940-1006 PT Time Calculation (min): 26 min  PT Assessment / Plan / Recommendation History of Present Illness  THis 79 y.o. male with known LV dysfunction admitted with increasing dyspnea and increased LE edema.  Pt found to be in Afib with RVR  Clinical Impression  Pt admitted with above. Pt currently with functional limitations due to the deficits listed below (see PT Problem List).  Pt will benefit from skilled PT to increase their independence and safety with mobility to allow discharge to the venue listed below.     PT Assessment  Patient needs continued PT services    Follow Up Recommendations  Supervision/Assistance - 24 hour;Home health PT                Equipment Recommendations  Rolling walker with 5" wheels;3in1 (PT)         Frequency Min 3X/week    Precautions / Restrictions Precautions Precautions: Fall Restrictions Weight Bearing Restrictions: No   Pertinent Vitals/Pain VSS with HR 87-138 bpm , No pain.        Mobility  Bed Mobility Bed Mobility: Supine to Sit;Sitting - Scoot to Edge of Bed Supine to Sit: 5: Supervision Sitting - Scoot to Edge of Bed: 5: Supervision Transfers Transfers: Sit to Stand;Stand to Sit Sit to Stand: 4: Min guard;With upper extremity assist;From bed Stand to Sit: 4: Min guard;With upper extremity assist;To chair/3-in-1 Details for Transfer Assistance: Requires min guard assist for balance Ambulation/Gait Ambulation/Gait Assistance: 4: Min guard Ambulation Distance (Feet): 155 Feet Assistive device: Rolling walker Ambulation/Gait Assistance Details: Pt ambulated with RW with good safety overall.  Occasional cues for sequencing steps and RW.   Gait Pattern: Step-through pattern;Decreased stride length Gait velocity: decreased Stairs: No Wheelchair Mobility Wheelchair Mobility: No         PT  Diagnosis: Generalized weakness  PT Problem List: Decreased activity tolerance;Decreased mobility;Decreased balance;Decreased knowledge of use of DME;Decreased safety awareness;Decreased knowledge of precautions PT Treatment Interventions: DME instruction;Gait training;Functional mobility training;Therapeutic activities;Therapeutic exercise;Balance training;Patient/family education     PT Goals(Current goals can be found in the care plan section) Acute Rehab PT Goals Patient Stated Goal: To get better PT Goal Formulation: With patient Time For Goal Achievement: 07/11/13 Potential to Achieve Goals: Good  Visit Information  Last PT Received On: 07/04/13 Assistance Needed: +1 History of Present Illness: THis 79 y.o. male with known LV dysfunction admitted with increasing dyspnea and increased LE edema.  Pt found to be in Afib with RVR       Prior [place] expects to be discharged to:: Private residence Living Arrangements: Spouse/significant other (lady friend ~17 days/month) Available Help at Discharge: Friend(s);Available PRN/intermittently Type of Home: House Home Access: Level entry Home Layout: One level Home Equipment: None Prior Function Level of Independence: Independent Comments: Pt admites to one fall in the last 2 wks that he attributes to increased edema in LEs Communication Communication: No difficulties Dominant Hand: Right    Cognition  Cognition Arousal/Alertness: Awake/alert Behavior During Therapy: WFL for tasks assessed/performed Overall Cognitive Status: Within Functional Limits for tasks assessed    Extremity/Trunk Assessment Upper Extremity Assessment Upper Extremity Assessment: Defer to OT evaluation Lower Extremity Assessment Lower Extremity Assessment: Generalized weakness Cervical / Trunk Assessment Cervical / Trunk Assessment: Normal   Balance Static Standing Balance Static Standing - Balance Support: Bilateral  upper extremity supported;During functional activity Static Standing -  Level of Assistance: 4: Min assist Static Standing - Comment/# of Minutes: Pt loses balance posteriorly requiring min assist for steadying High Level Balance High Level Balance Activites: Turns;Direction changes High Level Balance Comments: Needs min assist to steady  End of Session PT - End of Session Equipment Utilized During Treatment: Gait belt Activity Tolerance: Patient limited by fatigue Patient left: in bed;with call bell/phone within reach;with family/visitor present Nurse Communication: Mobility status       INGOLD,Staphany Ditton 07/04/2013, 10:59 AM  [facility] [phone] (pager)

## 2013-07-05 ENCOUNTER — Inpatient Hospital Stay (HOSPITAL_COMMUNITY): Payer: Medicare Other

## 2013-07-05 LAB — BASIC METABOLIC PANEL
BUN: 31 mg/dL — ABNORMAL HIGH (ref 6–23)
Chloride: 105 mEq/L (ref 96–112)
Glucose, Bld: 125 mg/dL — ABNORMAL HIGH (ref 70–99)
Potassium: 3.8 mEq/L (ref 3.5–5.1)

## 2013-07-05 LAB — GLUCOSE, CAPILLARY: Glucose-Capillary: 107 mg/dL — ABNORMAL HIGH (ref 70–99)

## 2013-07-05 LAB — PROTIME-INR: INR: 1.58 — ABNORMAL HIGH (ref 0.00–1.49)

## 2013-07-05 MED ORDER — WARFARIN SODIUM 2.5 MG PO TABS
2.5000 mg | ORAL_TABLET | Freq: Once | ORAL | Status: AC
  Administered 2013-07-05: 2.5 mg via ORAL
  Filled 2013-07-05 (×2): qty 1

## 2013-07-05 NOTE — Progress Notes (Addendum)
Subjective:  No CP or increasing SOB   Objective:  Temp:  [97.6 F (36.4 C)-98.9 F (37.2 C)] 97.7 F (36.5 C) (12/27 0500) Pulse Rate:  [46-102] 91 (12/27 0500) Resp:  [16-20] 20 (12/27 0500) BP: (106-151)/(71-98) 115/80 mmHg (12/27 0500) SpO2:  [95 %-99 %] 98 % (12/27 0500) Weight:  [167 lb 12.8 oz (76.114 kg)] 167 lb 12.8 oz (76.114 kg) (12/27 0500) Weight change: 2 lb 3.8 oz (1.014 kg)  Intake/Output from previous day: 12/26 0701 - 12/27 0700 In: 423 [I.V.:423] Out: 1325 [Urine:1325]  Intake/Output from this shift:    Physical Exam: General appearance: alert and no distress Neck: no adenopathy, no carotid bruit, no JVD, supple, symmetrical, trachea midline and thyroid not enlarged, symmetric, no tenderness/mass/nodules Lungs: clear to auscultation bilaterally Heart: regular rate and rhythm, S1, S2 normal, no murmur, click, rub or gallop Extremities: extremities normal, atraumatic, no cyanosis or edema  Lab Results: Results for orders placed during the hospital encounter of 06/29/13 (from the past 48 hour(s))  GLUCOSE, CAPILLARY     Status: Abnormal   Collection Time    07/03/13 12:00 PM      Result Value Range   Glucose-Capillary 157 (*) 70 - 99 mg/dL  GLUCOSE, CAPILLARY     Status: Abnormal   Collection Time    07/03/13  3:58 PM      Result Value Range   Glucose-Capillary 190 (*) 70 - 99 mg/dL  GLUCOSE, CAPILLARY     Status: Abnormal   Collection Time    07/03/13  9:26 PM      Result Value Range   Glucose-Capillary 119 (*) 70 - 99 mg/dL  BASIC METABOLIC PANEL     Status: Abnormal   Collection Time    07/04/13  6:05 AM      Result Value Range   Sodium 145  135 - 145 mEq/L   Potassium 4.6  3.5 - 5.1 mEq/L   Chloride 109  96 - 112 mEq/L   CO2 27  19 - 32 mEq/L   Glucose, Bld 109 (*) 70 - 99 mg/dL   BUN 35 (*) 6 - 23 mg/dL   Creatinine, Ser 1.25  0.50 - 1.35 mg/dL   Calcium 7.7 (*) 8.4 - 10.5 mg/dL   GFR calc non Af Amer 51 (*) >90 mL/min   GFR  calc Af Amer 59 (*) >90 mL/min   Comment: (NOTE)     The eGFR has been calculated using the CKD EPI equation.     This calculation has not been validated in all clinical situations.     eGFR's persistently <90 mL/min signify possible Chronic Kidney     Disease.  PROTIME-INR     Status: Abnormal   Collection Time    07/04/13  6:05 AM      Result Value Range   Prothrombin Time 18.5 (*) 11.6 - 15.2 seconds   INR 1.59 (*) 0.00 - 1.49  MAGNESIUM     Status: None   Collection Time    07/04/13  6:05 AM      Result Value Range   Magnesium 2.0  1.5 - 2.5 mg/dL  GLUCOSE, CAPILLARY     Status: Abnormal   Collection Time    07/04/13  7:24 AM      Result Value Range   Glucose-Capillary 101 (*) 70 - 99 mg/dL  GLUCOSE, CAPILLARY     Status: Abnormal   Collection Time    07/04/13 12:30 PM  Result Value Range   Glucose-Capillary 116 (*) 70 - 99 mg/dL  GLUCOSE, CAPILLARY     Status: Abnormal   Collection Time    07/04/13  4:22 PM      Result Value Range   Glucose-Capillary 118 (*) 70 - 99 mg/dL  GLUCOSE, CAPILLARY     Status: Abnormal   Collection Time    07/04/13 11:58 PM      Result Value Range   Glucose-Capillary 192 (*) 70 - 99 mg/dL  BASIC METABOLIC PANEL     Status: Abnormal   Collection Time    07/05/13  4:10 AM      Result Value Range   Sodium 138  135 - 145 mEq/L   Comment: DELTA CHECK NOTED   Potassium 3.8  3.5 - 5.1 mEq/L   Comment: DELTA CHECK NOTED   Chloride 105  96 - 112 mEq/L   CO2 26  19 - 32 mEq/L   Glucose, Bld 125 (*) 70 - 99 mg/dL   BUN 31 (*) 6 - 23 mg/dL   Creatinine, Ser 1.14  0.50 - 1.35 mg/dL   Calcium 7.6 (*) 8.4 - 10.5 mg/dL   GFR calc non Af Amer 57 (*) >90 mL/min   GFR calc Af Amer 66 (*) >90 mL/min   Comment: (NOTE)     The eGFR has been calculated using the CKD EPI equation.     This calculation has not been validated in all clinical situations.     eGFR's persistently <90 mL/min signify possible Chronic Kidney     Disease.  PROTIME-INR      Status: Abnormal   Collection Time    07/05/13  4:10 AM      Result Value Range   Prothrombin Time 18.4 (*) 11.6 - 15.2 seconds   INR 1.58 (*) 0.00 - 1.49  GLUCOSE, CAPILLARY     Status: Abnormal   Collection Time    07/05/13  7:23 AM      Result Value Range   Glucose-Capillary 107 (*) 70 - 99 mg/dL    Imaging: Imaging results have been reviewed  Assessment/Plan:   1. Principal Problem: 2.   Atrial fibrillation 3. Active Problems: 4.   Acute on chronic systolic congestive heart failure 5.   Presence of permanent cardiac pacemaker 6.   Aortic stenosis 7.   Current use of long term anticoagulation 8.   Chronic kidney disease stage III 9.   Diabetes mellitus without complication 10.   Time Spent Directly with Patient:  20 minutes  Length of Stay:  LOS: 6 days   S'P Dual chamber upgrade to PTVPM and AVN abl. Pacing. Feels good. INR sub therapeutic. Will ask Dr. Lovena Le whether he wants pt to waite for INR to be therapeutic prior to D/C home (IV hep until then) or D/C today on home coumadin dose and let drift up. Will check CXR. Dr. Loletha Grayer was worried about deterioration of LV fxn with AS (? Low gradient AS) and wanted R/L heart cath which we will schedule for Monday.    Lorretta Harp 07/05/2013, 9:14 AM

## 2013-07-05 NOTE — Op Note (Signed)
[name] [name], MAHESHWARI NO.:  [id number]  MEDICAL RECORD NO.:  [id number]  LOCATION:  [id number]                        FACILITY:  [facility]  PHYSICIAN:  Champ Mungo. Lovena Le, MD    DATE OF BIRTH:  1927-02-05  DATE OF PROCEDURE:  07/04/2013 DATE OF DISCHARGE:                              OPERATIVE REPORT   PROCEDURE PERFORMED:  Venography of the left upper extremity, followed by insertion of a new left ventricular as well as right ventricular pacing lead, followed by removal of a previously implant single-chamber pacemaker, pacemaker pocket revision, insertion of a new biventricular pacemaker and AV node ablation.  INTRODUCTION:  The patient is an 79 year old man who was admitted to the hospital with atrial fibrillation and rapid ventricular response.  He has a history of symptomatic bradycardia, status post pacemaker insertion, and has had chronic atrial fibrillation for many years.  With the last pacemaker implantation several years ago, his atrial lead was intact.  He has had uncontrolled atrial fibrillation and worsening heart failure symptoms with an ejection fraction of 25%, and the patient is referred now for upgrade from a single-chamber pacemaker to a biventricular pacemaker.  Because of ventricular rates been controlled, he will undergo AV node ablation following his pacemaker insertion.  DESCRIPTION OF PROCEDURE:  After informed consent was obtained, the patient was taken to the diagnostic EP lab in a fasting state.  After usual preparation and draping, intravenous fentanyl and midazolam was given for sedation.  A 10 mL of IV contrast was injected into the left upper extremity venous system, and this demonstrated that the left subclavian vein was superiorly displaced and patent.  A 30 mL of lidocaine was infiltrated into the left infraclavicular region after the usual preparation and draping, and a 5-cm incision was carried out over the site of the old pacemaker  insertion and electrocautery was utilized to dissect down to the fascial plane.  The left subclavian vein was punctured x2 and the St. Jude model 1688 T 58-cm active fixation pacing lead, serial #[id number] was advanced down to the right ventricle. Mapping was carried out in the right ventricle and at the RV septum, the R-waves (paced measured 5 mV).  There was a large injury current with active fixation of the lead, and the pacing threshold was 0.5 V at 0.4 milliseconds with a pacing impedance of 500 ohms.  A 10 V pacing did not stimulate the diaphragm.  With these satisfactory parameters, attention was then turned to the placement of the left ventricular lead.  A 6- [place] hexapolar EP catheter along with a coronary sinus guiding catheter was inserted into the coronary sinus without difficulty and venography was carried out of the coronary sinus.  This demonstrated a small to moderate-sized posterior vein, a small lateral vein, a small high lateral vein, and termination of the coronary sinus into the anterior vein of the left ventricle.  The lateral vein was selected for LV lead placement.  It had a 150 degree turn coming off the coronary sinus.  A St. Jude subselective catheter was then utilized and an 0.014 angioplasty guidewire was advanced into the lateral vein.  The [place] [phone]  86-cm quadripolar pacing lead was advanced over the guidewire into the distal portion of the lateral vein.  In this location, the LV pacing threshold was less than a V at 0.4 milliseconds to distal electrodes.  The pacing impedance was approximately 800 ohms and the R-waves measured 27 mV.  With these satisfactory parameters, the subselective guiding catheter along with the expander guiding catheter was liberated from the coronary sinus in the usual manner.  At this point, the left ventricular and new right ventricular pacing leads were secured to the subpectoralis fascia with a figure-of-eight  silk suture. The sewing sleeve was also secured with silk suture.  Electrocautery was utilized to enter the pacemaker pocket, and the single-chamber pacemaker was removed in total.  The atrial lead and ventricular leads were freed up from their dense fibrous adhesions.  It should be noted that the ventricular pacing impedance was quite elevated bipolar though unipolar pacing was satisfactory.  The pacing threshold unipolar was approximately 1.5 V.  With these satisfactory parameters and because of the right ventricular pacing lead had been placed 23 years prior, it was deemed most appropriate to place the RV lead.  Having accomplished all of the above, the St. Jude Allure Quadra RF biventricular pacemaker, serial [id number] was connected to the old atrial lead, the new RV lead and a new left ventricular pacing lead was hooked up, and at this point, attention was then turned to creation of a new pocket.  The fibrous scar tissue was freed up from the old pocket and a combination of blunt dissection and electrocautery was utilized to dissect through the pectoralis major muscle down to the subpectoral space.  Electrocautery was utilized to assure hemostasis.  The biventricular pacemaker was inserted under the pectoralis major muscle into the subpectoral position, and the pocket was irrigated with antibiotic irrigation.  The 2-0 Vicryl suture was utilized to secure hemostasis and reapproximate the pectoralis major muscle.  The pocket was again irrigated and then the incision was closed with 2-0 and 3-0 Vicryl suture.  Benzoin and Steri-Strips were painted on the skin and pressure was held for 10 minutes.  At this point, I scrubbed out of the case to proceed with AV node ablation.  After the patient was again sedated and lidocaine was infiltrated into the right groin region,  the right femoral vein was punctured and the 7- [place] quadripolar ablation catheter was inserted percutaneously  into the right femoral vein and advanced under fluoroscopic guidance to the region of the AV node and His bundle.  Four RF energy applications were delivered to the AV node region resulted in the creation of complete heart block with a junctional escape rate of 45 beats per minute.  The patient's pacemaker was reprogrammed to a lower rate of 90, the ablation catheter was removed after approximately 15 minutes of observation, and the patient was returned to the recovery area in satisfactory condition to have his sheaths removed.  COMPLICATIONS:  This demonstrates successful upgrade of a single-chamber pacemaker to a biventricular pacemaker followed by AV nodal ablation without immediate procedure complication.     Champ Mungo. Lovena Le, MD     GWT/MEDQ  D:  07/04/2013  T:  07/05/2013  Job:  [id number]

## 2013-07-05 NOTE — Progress Notes (Signed)
7 beats of V-tach noted on telemetry.  Patient asymptomatic, VSS.  Cardiology notified.  Order received to check magnesium level, add on to previous lab draw.  Will continue to monitor.  [name] [name]

## 2013-07-05 NOTE — Progress Notes (Signed)
ANTICOAGULATION CONSULT NOTE - Follow-up Consult  Pharmacy Consult for Coumadin Indication: atrial fibrillation  Allergies  Allergen Reactions  . Amiodarone Other (See Comments)    Amiodarone induced hepatitis    Patient Measurements: Height: 5\' 9"  (175.3 cm) Weight: 167 lb 12.8 oz (76.114 kg) IBW/kg (Calculated) : 70.7  Vital Signs: Temp: 97.7 F (36.5 C) (12/27 0500) BP: 115/80 mmHg (12/27 0500) Pulse Rate: 91 (12/27 0500)  Labs:  Recent Labs  07/03/13 0415 07/04/13 0605 07/05/13 0410  LABPROT 21.4* 18.5* 18.4*  INR 1.92* 1.59* 1.58*  CREATININE 1.22 1.25 1.14    Estimated Creatinine Clearance: 48.2 ml/min (by C-G formula based on Cr of 1.14).   Medical History: Past Medical History  Diagnosis Date  . Atrial fibrillation     echo- EF 35-40%; LA severely dilated; RA mod dilated, RV systolic pressure 0000000; LV systolic fcn mod reduced  . Atrial fibrillation     stress test - post stress EF 28%, AFib w/ RVR and LBBB, apical and septal thinning; increased RV free wall uptake consistent w/ pressure and/or volume overload  . Aortic valvular stenoses     not symtomatic  . SSS (sick sinus syndrome)     pacemaker placed in 1993  . Diabetes mellitus without complication     Medications:  See electronic med rec  Assessment: 79 y.o. male presents with edema, SOB, rapid afib. Pt on coumadin 5mg  daily PTA for afib. Baseline INR 7.97. Last CBC stable, plt low. Held coumadin for BiV ICD with AVN ablation on 12/26. Restarted coumadin on 12/25. Possible cath planned for Monday. Will continue coumadin until otherwise notified.  Goal of Therapy:  INR 2-3 Monitor platelets by anticoagulation protocol: Yes   Plan:  1. Warfarin 2.5 mg po x1 tonight 2. Daily INR, ordered CBC for tomorrow 3. Monitor for signs/sx of bleed

## 2013-07-06 LAB — BASIC METABOLIC PANEL
BUN: 27 mg/dL — ABNORMAL HIGH (ref 6–23)
CO2: 28 mEq/L (ref 19–32)
Calcium: 7.6 mg/dL — ABNORMAL LOW (ref 8.4–10.5)
Creatinine, Ser: 1.09 mg/dL (ref 0.50–1.35)
GFR calc non Af Amer: 60 mL/min — ABNORMAL LOW (ref >90)
Glucose, Bld: 92 mg/dL (ref 70–99)
Potassium: 3.5 mEq/L (ref 3.5–5.1)
Sodium: 138 mEq/L (ref 135–145)

## 2013-07-06 LAB — GLUCOSE, CAPILLARY
Glucose-Capillary: 124 mg/dL — ABNORMAL HIGH (ref 70–99)
Glucose-Capillary: 156 mg/dL — ABNORMAL HIGH (ref 70–99)
Glucose-Capillary: 175 mg/dL — ABNORMAL HIGH (ref 70–99)

## 2013-07-06 LAB — CBC
HCT: 38.1 % — ABNORMAL LOW (ref 39.0–52.0)
Hemoglobin: 12.6 g/dL — ABNORMAL LOW (ref 13.0–17.0)
MCH: 31.1 pg (ref 26.0–34.0)
MCHC: 33.1 g/dL (ref 30.0–36.0)
MCV: 94.1 fL (ref 78.0–100.0)
RBC: 4.05 MIL/uL — ABNORMAL LOW (ref 4.22–5.81)
RDW: 17.3 % — ABNORMAL HIGH (ref 11.5–15.5)
WBC: 8.4 10*3/uL (ref 4.0–10.5)

## 2013-07-06 LAB — PROTIME-INR: INR: 1.55 — ABNORMAL HIGH (ref 0.00–1.49)

## 2013-07-06 MED ORDER — ASPIRIN 81 MG PO CHEW
81.0000 mg | CHEWABLE_TABLET | ORAL | Status: AC
  Administered 2013-07-07: 81 mg via ORAL
  Filled 2013-07-06: qty 1

## 2013-07-06 MED ORDER — SODIUM CHLORIDE 0.9 % IV SOLN: 250.0000 mL | INTRAVENOUS | Status: DC | PRN

## 2013-07-06 MED ORDER — SODIUM CHLORIDE 0.9 % IJ SOLN: 3.0000 mL | INTRAMUSCULAR | Status: DC | PRN

## 2013-07-06 MED ORDER — SODIUM CHLORIDE 0.9 % IJ SOLN
3.0000 mL | Freq: Two times a day (BID) | INTRAMUSCULAR | Status: DC
  Administered 2013-07-06 – 2013-07-07 (×3): 3 mL via INTRAVENOUS

## 2013-07-06 MED ORDER — SODIUM CHLORIDE 0.9 % IV SOLN
INTRAVENOUS | Status: DC
  Administered 2013-07-07: 20 mL/h via INTRAVENOUS

## 2013-07-06 NOTE — Progress Notes (Signed)
Subjective: Denies CP/SOB. Still notes bilateral LEE.   Objective: Vital signs in last 24 hours: Temp:  [97.4 F (36.3 C)-98.5 F (36.9 C)] 97.7 F (36.5 C) (12/28 0738) Pulse Rate:  [90-101] 90 (12/28 0738) Resp:  [17-18] 18 (12/28 0738) BP: (111-122)/(73-87) 117/87 mmHg (12/28 0738) SpO2:  [94 %-99 %] 98 % (12/28 0738) Last BM Date: 07/03/13  Intake/Output from previous day: 12/27 0701 - 12/28 0700 In: 1080 [P.O.:1080] Out: 1150 [Urine:1150] Intake/Output this shift:    Medications Current Facility-Administered Medications  Medication Dose Route Frequency Provider Last Rate Last Dose  . 0.9 %  sodium chloride infusion  10 mL/hr Intravenous Continuous Mihai Croitoru, MD 10 mL/hr at 07/03/13 0800 10 mL/hr at 07/03/13 0800  . acetaminophen (TYLENOL) tablet 325-650 mg  325-650 mg Oral Q4H PRN Evans Lance, MD      . acetaminophen (TYLENOL) tablet 650 mg  650 mg Oral Q4H PRN Jacolyn Reedy, MD   650 mg at 07/05/13 2202  . carbidopa-levodopa (SINEMET IR) 25-100 MG per tablet immediate release 1 tablet  1 tablet Oral TID Jacolyn Reedy, MD   1 tablet at 07/05/13 2051  . digoxin (LANOXIN) tablet 0.125 mg  0.125 mg Oral Daily Jacolyn Reedy, MD   0.125 mg at 07/05/13 1010  . furosemide (LASIX) tablet 40 mg  40 mg Oral Daily Mihai Croitoru, MD   40 mg at 07/05/13 1007  . insulin aspart (novoLOG) injection 0-9 Units  0-9 Units Subcutaneous TID WC Jacolyn Reedy, MD   1 Units at 07/05/13 1304  . metoprolol succinate (TOPROL-XL) 24 hr tablet 50 mg  50 mg Oral Daily Jacolyn Reedy, MD   50 mg at 07/05/13 1009  . ondansetron (ZOFRAN) injection 4 mg  4 mg Intravenous Q6H PRN Evans Lance, MD      . potassium chloride (K-DUR,KLOR-CON) CR tablet 10 mEq  10 mEq Oral Daily Mihai Croitoru, MD   10 mEq at 07/05/13 1007  . sodium chloride 0.9 % injection 3 mL  3 mL Intravenous Q12H Mihai Croitoru, MD   3 mL at 07/05/13 2051  . sodium chloride 0.9 % injection 3 mL  3 mL Intravenous  PRN Sanda Klein, MD      . Warfarin - Pharmacist Dosing Inpatient   Does not apply [place], [facility], [place]        PE: General appearance: alert, cooperative and no distress Lungs: clear to auscultation bilaterally Heart: regular rate and rhythm Extremities: 3+ bilateral LEE Pulses: 2+ and symmetric Skin: warm and dry] Neurologic: Grossly normal  Lab Results:   Recent Labs  07/06/13 0500  WBC 8.4  HGB 12.6*  HCT 38.1*  PLT 91*   BMET  Recent Labs  07/04/13 0605 07/05/13 0410 07/06/13 0500  NA 145 138 138  K 4.6 3.8 3.5  CL 109 105 102  CO2 27 26 28   GLUCOSE 109* 125* 92  BUN 35* 31* 27*  CREATININE 1.25 1.14 1.09  CALCIUM 7.7* 7.6* 7.6*   PT/INR  Recent Labs  07/04/13 0605 07/05/13 0410 07/06/13 0500  LABPROT 18.5* 18.4* 18.2*  INR 1.59* 1.58* 1.55*      Assessment/Plan    Principal Problem:   Atrial fibrillation Active Problems:   Acute on chronic systolic congestive heart failure   Presence of permanent cardiac pacemaker   Aortic stenosis   Current use of long term anticoagulation   Chronic kidney disease stage III   Diabetes mellitus without  complication  Plan: day 2 s/p PPM insertion. Paced rhythm on telemetry. Still with significant bilateral LEE. EF 20-25% on recent echo. He is on 40 mg IV Lasix daily. Net negative. -1.8L ? Increasing to BID. Renal function is stable  Will plan for [facility] in the am. On warfarin. INR sub therapeutic at 1.55. ? Switching to heparin in anticipation of cath tomorrow. Will need INR to be <1.7 for cath. MD to follow.    LOS: 7 days    Brittainy M. Ladoris Gene 07/06/2013 8:17 AM  Agree with note written by Ellen Henri  PAC  S/P Bi V upgrade and AVN ablation.. Pacing at 94. Clinically improved. INR sub therapeutic. For left heart cath tomorrow. Spoke with Dr. Lovena Le who prefers [place] not be performed secondary to recent lead implantation and risk of dislodgement.. Agree with increase diuretic. No  hep for now secondary to sub pectoral implant and risk of pocket hematoma per Dr. Lovena Le.    Lorretta Harp 07/06/2013 8:25 AM

## 2013-07-07 ENCOUNTER — Encounter (HOSPITAL_COMMUNITY)
Admission: EM | Disposition: A | Discharge status: Home Health | Payer: Self-pay | Source: Home / Self Care | Attending: Cardiovascular Disease

## 2013-07-07 DIAGNOSIS — I739 Peripheral vascular disease, unspecified: Secondary | ICD-10-CM

## 2013-07-07 DIAGNOSIS — I429 Cardiomyopathy, unspecified: Secondary | ICD-10-CM | POA: Diagnosis present

## 2013-07-07 DIAGNOSIS — G20A1 Parkinson's disease without dyskinesia, without mention of fluctuations: Secondary | ICD-10-CM

## 2013-07-07 DIAGNOSIS — G20 Parkinson's disease: Secondary | ICD-10-CM

## 2013-07-07 HISTORY — PX: LEFT HEART CATHETERIZATION WITH CORONARY ANGIOGRAM: SHX5451

## 2013-07-07 HISTORY — DX: Cardiomyopathy, unspecified: I42.9

## 2013-07-07 HISTORY — DX: Parkinson's disease: G20

## 2013-07-07 HISTORY — DX: Parkinson's disease without dyskinesia, without mention of fluctuations: G20.A1

## 2013-07-07 LAB — CBC
Hemoglobin: 12.4 g/dL — ABNORMAL LOW (ref 13.0–17.0)
MCH: 31.3 pg (ref 26.0–34.0)
MCHC: 33.8 g/dL (ref 30.0–36.0)
MCV: 92.7 fL (ref 78.0–100.0)
RBC: 3.96 MIL/uL — ABNORMAL LOW (ref 4.22–5.81)
RDW: 17.1 % — ABNORMAL HIGH (ref 11.5–15.5)
WBC: 6.5 10*3/uL (ref 4.0–10.5)

## 2013-07-07 LAB — BASIC METABOLIC PANEL
BUN: 26 mg/dL — ABNORMAL HIGH (ref 6–23)
CO2: 29 mEq/L (ref 19–32)
Calcium: 7.5 mg/dL — ABNORMAL LOW (ref 8.4–10.5)
Chloride: 104 mEq/L (ref 96–112)
Creatinine, Ser: 0.98 mg/dL (ref 0.50–1.35)
GFR calc Af Amer: 85 mL/min — ABNORMAL LOW (ref >90)
Sodium: 140 mEq/L (ref 135–145)

## 2013-07-07 LAB — GLUCOSE, CAPILLARY
Glucose-Capillary: 82 mg/dL (ref 70–99)
Glucose-Capillary: 126 mg/dL — ABNORMAL HIGH (ref 70–99)

## 2013-07-07 LAB — PROTIME-INR
INR: 1.36 (ref 0.00–1.49)
Prothrombin Time: 16.4 seconds — ABNORMAL HIGH (ref 11.6–15.2)

## 2013-07-07 SURGERY — LEFT HEART CATHETERIZATION WITH CORONARY ANGIOGRAM
Anesthesia: LOCAL

## 2013-07-07 MED ORDER — HEPARIN (PORCINE) IN NACL 2-0.9 UNIT/ML-% IJ SOLN
INTRAMUSCULAR | Status: AC
  Filled 2013-07-07: qty 1000

## 2013-07-07 MED ORDER — WARFARIN - PHARMACIST DOSING INPATIENT: Freq: Every day | Status: DC

## 2013-07-07 MED ORDER — FENTANYL CITRATE 0.05 MG/ML IJ SOLN
INTRAMUSCULAR | Status: AC
Start: 2013-07-07 — End: 2013-07-07
  Filled 2013-07-07: qty 2

## 2013-07-07 MED ORDER — POTASSIUM CHLORIDE CRYS ER 20 MEQ PO TBCR
40.0000 meq | EXTENDED_RELEASE_TABLET | Freq: Once | ORAL | Status: AC
  Administered 2013-07-07: 40 meq via ORAL
  Filled 2013-07-07: qty 2

## 2013-07-07 MED ORDER — SODIUM CHLORIDE 0.9 % IV SOLN
1.0000 mL/kg/h | INTRAVENOUS | Status: AC
  Administered 2013-07-07: 1 mL/kg/h via INTRAVENOUS

## 2013-07-07 MED ORDER — MIDAZOLAM HCL 2 MG/2ML IJ SOLN
INTRAMUSCULAR | Status: AC
  Filled 2013-07-07: qty 2

## 2013-07-07 MED ORDER — LIDOCAINE HCL (PF) 1 % IJ SOLN
INTRAMUSCULAR | Status: AC
  Filled 2013-07-07: qty 30

## 2013-07-07 MED ORDER — SODIUM CHLORIDE 0.9 % IJ SOLN: 3.0000 mL | INTRAMUSCULAR | Status: DC | PRN

## 2013-07-07 MED ORDER — SODIUM CHLORIDE 0.9 % IV SOLN: 250.0000 mL | INTRAVENOUS | Status: DC | PRN

## 2013-07-07 MED ORDER — SODIUM CHLORIDE 0.9 % IJ SOLN
3.0000 mL | Freq: Two times a day (BID) | INTRAMUSCULAR | Status: DC
  Administered 2013-07-07: 3 mL via INTRAVENOUS

## 2013-07-07 MED ORDER — WARFARIN SODIUM 5 MG PO TABS
5.0000 mg | ORAL_TABLET | Freq: Once | ORAL | Status: AC
  Administered 2013-07-07: 5 mg via ORAL
  Filled 2013-07-07: qty 1

## 2013-07-07 MED ORDER — POTASSIUM CHLORIDE CRYS ER 20 MEQ PO TBCR
20.0000 meq | EXTENDED_RELEASE_TABLET | Freq: Every day | ORAL | Status: DC
  Filled 2013-07-07: qty 1

## 2013-07-07 MED ORDER — NITROGLYCERIN 0.2 MG/ML ON CALL CATH LAB
INTRAVENOUS | Status: AC
  Filled 2013-07-07: qty 1

## 2013-07-07 NOTE — Progress Notes (Signed)
OT Cancellation Note  Patient Details Name: [name] [name] MRN: [id number] DOB: 11-Oct-1926   Cancelled Treatment:    Reason Eval/Treat Not Completed: Medical issues which prohibited therapy (bedrest following precedure)  [name] 07/07/2013, 2:04 PM

## 2013-07-07 NOTE — Progress Notes (Signed)
PT Cancellation Note  Patient Details Name: [name] [name] MRN: [id number] DOB: [birth year]   Cancelled Treatment:    Reason Eval/Treat Not Completed: Other (comment) (pt on bedrest s/p sheath removal after cardiac cath.) PT to see pt tomorrow.    Barbarann Ehlers Audrielle Vankuren, PT, DPT [phone]   07/07/2013, 3:26 PM

## 2013-07-07 NOTE — Progress Notes (Signed)
Subjective:  No SOB at rest.  Objective:  Vital Signs in the last 24 hours: Temp:  [97.5 F (36.4 C)-98.3 F (36.8 C)] 97.5 F (36.4 C) (12/29 0741) Pulse Rate:  [90-100] 98 (12/29 0741) Resp:  [16-18] 18 (12/29 0741) BP: (112-122)/(71-84) 112/84 mmHg (12/29 0741) SpO2:  [96 %-98 %] 98 % (12/29 0741)  Intake/Output from previous day:  Intake/Output Summary (Last 24 hours) at 07/07/13 0855 Last data filed at 07/07/13 0500  Gross per 24 hour  Intake    240 ml  Output    350 ml  Net   -110 ml    Physical Exam: General appearance: alert, cooperative and no distress Neck: soft transmitted murmur Lungs: clear to auscultation bilaterally and Pacer site dressing removed, no hematoma Heart: regular rate and rhythm   Rate: 70  Rhythm: paced  Lab Results:  Recent Labs  07/06/13 0500 07/07/13 0610  WBC 8.4 6.5  HGB 12.6* 12.4*  PLT 91* 97*    Recent Labs  07/06/13 0500 07/07/13 0610  NA 138 140  K 3.5 3.5  CL 102 104  CO2 28 29  GLUCOSE 92 89  BUN 27* 26*  CREATININE 1.09 0.98   No results found for this basename: TROPONINI, CK, MB,  in the last 72 hours  Recent Labs  07/07/13 0610  INR 1.36    Imaging: Imaging results have been reviewed  Cardiac Studies:  Assessment/Plan:   Principal Problem:   Acute on chronic systolic congestive heart failure Active Problems:   Permanent atrial fibrillation- AVN RFA 07/05/13   Biventricular cardiac pacemaker upgrade 07/05/13 (St Jude)   Aortic stenosis-  low gradient severe AS   Cardiomyopathy- etiology not yet determined. EF 20-25% Echo 06/30/13   Current use of long term anticoagulation   Diabetes mellitus without complication   Parkinson's disease    PLAN: Lt heart cath today (no Rt heart with recent leads placed). No Heparin secondary to recent Bi V, ? Resume Coumadin post cath without Heparin. Increase daily K+ while on Lasix.  Kerin Ransom PA-C Beeper [id number] 07/07/2013, 8:55 AM

## 2013-07-07 NOTE — Progress Notes (Signed)
Pt. Seen and examined. Agree with the NP/PA-C note as written. I have reviewed his echocardiogram personally, there does appear visually to be severe aortic stenosis, however, the gradient across the valve is only moderately elevated. Given his reduced EF of 25%, this may be consistent with low flow, low gradient AS.  Given his recent decline in EF and the fact he has not had coronary evaluation in the past, I would recommend cath to evaluate for possible CAD. This will likely be diagnostic, as there are many possible management options depending on what is found. He just recently (last week), underwent BI-V pacer upgrade, therefore, no RHC will be performed, due to concern for possible lead dislodgement.  I have discussed the risks and benefits of the procedure with Mr. Feland and he is agreeable and provided informed consent.  Pixie Casino, MD, [facility] Attending Cardiologist [place]

## 2013-07-07 NOTE — Care Management Note (Signed)
    Page 1 of 2   07/08/2013     11:58:50 AM   CARE MANAGEMENT NOTE 07/08/2013  Patient:  [name] [name], [name] [name]   Account Number:  [id number]  Date Initiated:  06/30/2013  Documentation initiated by:  MAYO,HENRIETTA  Subjective/Objective Assessment:   adm with dx of AFib w/RVR; lives alone but has supportive friend who stays with him as needed     Action/Plan:   Anticipated DC Date:     Anticipated DC Plan:        DC Planning Services  CM consult      [place]   Choice offered to / List presented to:  C-1 Patient   DME arranged  3-N-1  [place] Moselle      DME agency  [place] arranged  HH-1 RN  [place] [facility]      [place].   Status of service:  Completed, signed off Medicare Important Message given?   (If response is "NO", the following Medicare IM given date fields will be blank) Date Medicare IM given:   Date Additional Medicare IM given:    Discharge Disposition:  [place]  Per UR Regulation:  Reviewed for med. necessity/level of care/duration of stay  If discussed at Long Length of Stay Meetings, dates discussed:    Comments:  07-08-13 1156 Jacqlyn Krauss, RN,BSN [phone] CM did make referral for [facility] services via [facility]. SOC to begin within 24-48 hours post d/c. DME RW and 3n1 to be delivered to room before d/c.    07-07-13  1555 Jacqlyn Krauss, RN,BSN [phone] Male presented with a history of LV dysfunction with an EF of around 35-40%- afib. Post cath 07-07-13 normal coronary arteries. Mild 20-30% proximal right renal artery stenosis. Plan home with [facility] services once medically stable.

## 2013-07-07 NOTE — Interval H&P Note (Signed)
History and Physical Interval Note:  07/07/2013 11:17 AM  [name] [name]  has presented today for surgery, with the diagnosis of AS  The various methods of treatment have been discussed with the patient and family. After consideration of risks, benefits and other options for treatment, the patient has consented to  Procedure(s): LEFT HEART CATHETERIZATION WITH CORONARY ANGIOGRAM (N/A) as a surgical intervention .  The patient's history has been reviewed, patient examined, no change in status, stable for surgery.  I have reviewed the patient's chart and labs.  Questions were answered to the patient's satisfaction.    Pixie Casino, MD, [facility] Attending Cardiologist [facility]  Cecile Guevara C

## 2013-07-07 NOTE — CV Procedure (Signed)
CARDIAC CATHETERIZATION REPORT  [name] ZESATI   NP:[id number] 07-May-1927  Performing Cardiologist: Pixie Casino Primary Physician: No primary provider on file. Primary Cardiologist:  Dr. Sallyanne Kuster  Procedures Performed:  5 Fr right femoral artery access  Native Coronary Angiography  Aortic angiogram with runoff  Indication(s): dyspnea and orthopnea  Pre-Procedural Diagnosis(es):  1. Acute on chronic systolic heart failure 2. Moderate to severe aortic stenosis  Post-Procedural Diagnosis(es): 1. Angiographically normal coronary arteries  Pre-Procedural Non-invasive testing: N/A  History: 79 y.o. male presented with a history of LV dysfunction with an EF of around 35-40% by echo in March. He has a history of atrial fibrillation of undetermined age of onset and also has a history of aortic stenosis that was moderate to severe on the last echo. He has been on chronic warfarin therapy. He became ill with cough and bronchitis and has been treated with antibiotics. Recently his blood pressure has become low and according to records that his son brought in amlodipine, diltiazem and lisinopril were stopped and recently his metoprolol dose was cut in half. He has had a two-week history of worsening edema and was brought to the emergency tonight with shortness of breath and worsening edema and found to be in rapid atrial fibrillation. He has no chest pain suggestive of angina. He has had difficulty sleeping at night. His appetite has been reduced. He has not had any significant syncope. Repeat echo demonstrated moderate to severe AS, with a reduced gradient, concerning for possible low-flow, low gradient AS.  It was ultimately decided to pursue and "ablate and pace" strategy, in part due to his newly reduced EF and intolerance to amiodarone in the past. He underwent AVN ablation by Dr. Lovena Le with BI-V pacer placement.  He is now referred for LHC to elucidate if there is possible CAD underlying as  a cause of his recently reduced EF.  Risks / Complications include, but not limited to: Death, MI, CVA/TIA, VF/VT (with defibrillation), Bradycardia (need for temporary pacer placement), contrast induced nephropathy, bleeding / bruising / hematoma / pseudoaneurysm, vascular or coronary injury (with possible emergent CT or Vascular Surgery), adverse medication reactions, infection.    Consent: Risks of procedure as well as the alternatives and risks of each were explained to the (patient/caregiver).  Consent for procedure obtained.  Procedure: The patient was brought to the 2nd [place] [facility] in the fasting state and prepped and draped in the usual sterile fashion for (Right groin) access.  Time Out: Verified patient identification, verified procedure, site/side was marked, verified correct patient position, special equipment/implants available, radiation safety measures in place (including badges and shielding), medications/allergies/relevent history reviewed, required imaging and test results available.  Performed  Procedure: The right femoral head was identified using tactile and fluoroscopic technique.  The right groin was anesthetized with 1% subcutaneous Lidocaine.  The right Common Femoral Artery was accessed using the Modified Seldinger Technique with placement of (5 Fr) sheath using the Seldinger technique.  The sheath was aspirated and flushed.  A 5 Fr JL4 Catheter was advanced of over a Standard J wire into the ascending Aorta.  The catheter was used to engage the left coronary artery.  Multiple cineangiographic views of the left coronary artery system(s) were performed. A 5 Fr JR4 Catheter was advanced of over a Safety J wire into the ascending Aorta.  The catheter was used to engage the right coronary artery.  Multiple cineangiographic views of the right coronary artery system(s) were performed.  Given the degree of aortic valve calcification and known stenosis,  the valve was not crossed.  Some difficulty was noted in advancing the catheter up the right iliac artery. Given concern for possible PAD, a 64F pigtail catheter was then advanced into the abdominal aorta, just above the renal arteries. 30 cc of contrast was injected at 15 ml/min (900 psi) and cineangiography was performed. The catheter and the wire were then removed completely out of the body. The patient was transferred to the holding area where the sheath was removed with manual pressure held for hemostasis.   Recovery: The patient was transported to the cath lab holding area in stable condition.   The patient  was stable before, during and following the procedure.   Patient did tolerate procedure well. There were not complications.  EBL: Minimal  Medications:  Premedication: none  Sedation:  1 mg IV Versed, 25 mcg IV Fentanyl  Contrast:  70 ml Omnipaque  Local Anesthesia: 3 cc 1% lidocaine   Hemodynamics:  Central Aortic Pressure / Mean Aortic Pressure: 114/70  LV Pressure / LV End diastolic Pressure:  N/A  Coronary Angiographic Data:  Angiographically normal coronaries. Normal LAD/LCx bifurcation.  Dominant RCA.  Impression: 1.  Angiographically normal coronary arteries. 2.  Mild 20-30% proximal right renal artery stenosis. 3.  No significant aortic, iliac or femoral artery disease.  Plan: 1.  Restart warfarin to goal INR of 2-3 for a-fib. 2.  Recommend outpatient dobutamine echocardiogram to assess for low-flow, low gradient AS.  Would, however, allow some time to see if EF improves with BI-V pacing. 3.  Anticipate probable d/c tomorrow.  The case and results was discussed with the patient and family if available.  The case and results was not discussed with the patient's PCP. The case and results was not discussed with the patient's Cardiologist.  Time Spent Directly with the Patient:  60 minutes  Pixie Casino, MD, [facility] Attending Cardiologist [facility]  Charlina Dwight C 07/07/2013, 1:21 PM

## 2013-07-07 NOTE — Progress Notes (Addendum)
ANTICOAGULATION CONSULT NOTE - Follow-up Consult  Pharmacy Consult for Coumadin Indication: atrial fibrillation  Allergies  Allergen Reactions  . Amiodarone Other (See Comments)    Amiodarone induced hepatitis    Patient Measurements: Height: 5\' 9"  (175.3 cm) Weight: 167 lb 12.8 oz (76.114 kg) IBW/kg (Calculated) : 70.7  Vital Signs: Temp: 97.5 F (36.4 C) (12/29 0741) Temp src: Oral (12/29 0741) BP: 129/84 mmHg (12/29 1100) Pulse Rate: 97 (12/29 1133)  Labs:  Recent Labs  07/05/13 0410 07/06/13 0500 07/07/13 0610  HGB  --  12.6* 12.4*  HCT  --  38.1* 36.7*  PLT  --  91* 97*  LABPROT 18.4* 18.2* 16.4*  INR 1.58* 1.55* 1.36  CREATININE 1.14 1.09 0.98    Estimated Creatinine Clearance: 56.1 ml/min (by C-G formula based on Cr of 0.98).   Medical History: Past Medical History  Diagnosis Date  . Atrial fibrillation     echo- EF 35-40%; LA severely dilated; RA mod dilated, RV systolic pressure 0000000; LV systolic fcn mod reduced  . Atrial fibrillation     stress test - post stress EF 28%, AFib w/ RVR and LBBB, apical and septal thinning; increased RV free wall uptake consistent w/ pressure and/or volume overload  . Aortic valvular stenoses     not symtomatic  . SSS (sick sinus syndrome)     pacemaker placed in 1993  . Diabetes mellitus without complication      Assessment: 79 y.o. male presented with edema, SOB, rapid afib. Pt on coumadin 5mg  daily PTA for afib. Admission INR 7.97. Coumadin held for BiV ICD with AVN ablation on 12/26 and left heart cath on 12/29.  Pharmacy has been consulted to resume coumadin.  No heparin bridge ordered due to recent BiV placement.  INR today is 1.36.  Last coumadin dose was 2.5 mg on 12/27.  PLTC remains low at 97.  No bleeding reported.  Goal of Therapy:  INR 2-3  Plan:  1. Warfarin 5 mg po x1 tonight 2. Daily INR 3. Monitor for signs/sx of bleed [name], Pharm.D. QP:[id number] 07/07/2013 1:30 PM

## 2013-07-08 DIAGNOSIS — I428 Other cardiomyopathies: Secondary | ICD-10-CM

## 2013-07-08 DIAGNOSIS — G20 Parkinson's disease: Secondary | ICD-10-CM

## 2013-07-08 LAB — PROTIME-INR
INR: 1.31 (ref 0.00–1.49)
Prothrombin Time: 16 s — ABNORMAL HIGH (ref 11.6–15.2)

## 2013-07-08 LAB — CBC
HCT: 39.7 % (ref 39.0–52.0)
MCV: 93.9 fL (ref 78.0–100.0)
RBC: 4.23 MIL/uL (ref 4.22–5.81)
RDW: 17.1 % — ABNORMAL HIGH (ref 11.5–15.5)
WBC: 9.4 10*3/uL (ref 4.0–10.5)

## 2013-07-08 LAB — BASIC METABOLIC PANEL
BUN: 24 mg/dL — ABNORMAL HIGH (ref 6–23)
GFR calc non Af Amer: 74 mL/min — ABNORMAL LOW (ref >90)
Glucose, Bld: 93 mg/dL (ref 70–99)
Potassium: 5.6 mEq/L — ABNORMAL HIGH (ref 3.7–5.3)

## 2013-07-08 LAB — GLUCOSE, CAPILLARY
Glucose-Capillary: 92 mg/dL (ref 70–99)
Glucose-Capillary: 138 mg/dL — ABNORMAL HIGH (ref 70–99)

## 2013-07-08 MED ORDER — ACETAMINOPHEN 325 MG PO TABS: 325.0000 mg | ORAL_TABLET | ORAL | Status: DC | PRN

## 2013-07-08 MED ORDER — WARFARIN SODIUM 5 MG PO TABS
5.0000 mg | ORAL_TABLET | Freq: Once | ORAL | Status: DC
  Filled 2013-07-08: qty 1

## 2013-07-08 NOTE — Progress Notes (Signed)
Subjective:  Up with PT  Objective:  Vital Signs in the last 24 hours: Temp:  [98.6 F (37 C)-99.1 F (37.3 C)] 99.1 F (37.3 C) (12/30 0510) Pulse Rate:  [94-97] 95 (12/30 0510) Resp:  [18] 18 (12/30 0510) BP: (101-129)/(60-89) 111/73 mmHg (12/30 0510) SpO2:  [95 %] 95 % (12/30 0510) Weight:  [166 lb 10.3 oz (75.588 kg)] 166 lb 10.3 oz (75.588 kg) (12/30 0510)  Intake/Output from previous day:  Intake/Output Summary (Last 24 hours) at 07/08/13 0945 Last data filed at 07/08/13 0510  Gross per 24 hour  Intake 268.08 ml  Output    200 ml  Net  68.08 ml    Physical Exam: General appearance: alert, cooperative and no distress Lungs: clear to auscultation bilaterally Heart: regular rate and rhythm   Rate: 80  Rhythm: paced  Lab Results:  Recent Labs  07/07/13 0610 07/08/13 0530  WBC 6.5 9.4  HGB 12.4* 13.1  PLT 97* 106*    Recent Labs  07/07/13 0610 07/08/13 0530  NA 140 139  K 3.5 5.6*  CL 104 101  CO2 29 29  GLUCOSE 89 93  BUN 26* 24*  CREATININE 0.98 0.97   No results found for this basename: TROPONINI, CK, MB,  in the last 72 hours  Recent Labs  07/08/13 0530  INR 1.31    Imaging: Imaging results have been reviewed  Cardiac Studies:  Assessment/Plan:   Principal Problem:   Acute on chronic systolic congestive heart failure Active Problems:   Permanent atrial fibrillation- AVN RFA 07/05/13   Biventricular cardiac pacemaker upgrade 07/05/13 (St Jude)   Aortic stenosis-  low gradient severe AS   Non-ischemic cardiomyopathy - EF 20-25% Echo 06/30/13; Cath - no obstructive CAD 07/07/13   Current use of long term anticoagulation   Diabetes mellitus without complication   Parkinson's disease    PLAN: Discharge this pm- not sure why K+ went from 3.5 to 5.6 after one dose of 40 meq KCL. He is on Lasix 40 mg daily. Check BMP when he comes for site check next week.   Kerin Ransom PA-C Beeper [id number] 07/08/2013, 9:45 AM

## 2013-07-08 NOTE — Progress Notes (Signed)
Read, reviewed, and agree.  Barbarann Ehlers [place], [place], DPT [phone]

## 2013-07-08 NOTE — Discharge Summary (Signed)
Patient ID: [name] [name],  MRN: [id number], DOB/AGE: 1926/07/23 79 y.o.  Admit date: 06/29/2013 Discharge date: 07/08/2013  Primary Care Provider: Dr. Nelda Bucks  Primary Cardiologist: Dr Sallyanne Kuster  Discharge Diagnoses Principal Problem:   Acute on chronic systolic congestive heart failure Active Problems:   Permanent atrial fibrillation- AVN RFA 07/05/13   Biventricular cardiac pacemaker upgrade 07/05/13 [facility] Jude)   Aortic stenosis-  low gradient severe AS   Non-ischemic cardiomyopathy - EF 20-25% Echo 06/30/13; Cath - no obstructive CAD 07/07/13   Current use of long term anticoagulation   Diabetes mellitus without complication   Parkinson's disease    Procedures: St Jude BiV pacemaker upgrade 07/05/13                         Coronary angiogram 07/07/13   Hospital Course:  The patient is an 79 year old man from [place] who was previously followed by Dr Rollene Fare. Marland Kitchen He has a history of symptomatic bradycardia, status post pacemaker 1993 with a gen change in 2006. He has had chronic atrial fibrillation for many years. 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 The patient has know moderate to severe AS with no history of CAD.                                             He was admitted 06/29/13 with uncontrolled atrial fibrillation and worsening heart failure symptoms as well as hypotension which limited his rate control options. An echo 06/30/13 revealed severe LVD with an EF of 20-25% and low gradient severe AS. He was seen in consult by Dr Lovena Le who recommended AVN RFA and an up grade to a BiV device. This was done 07/05/13 and the patient tolerated this well. His Coumadin was held pre-op and his INR drifted down to 1.3. It was decided not to resume Heparin post op secondary to an increased risk of pocket hematoma, and it was felt the pt needed a Lt heart cath. Dr Lovena Le did not feel it would be safe to do a Rt heart cath with his new leads. The pt will need a Dobutamine  echo at some point to further access his AS. His coronary angiogram 07/07/13 showed no significant CAD. His Coumadin has been resumed. He will come to the office Monday 07/14/12 for a site check, BMP, and an INR. The patient was not sure what medications he was on for his diabetes prior to admission and he was instructed to contact Dr Denton Lank office about this.   Discharge Vitals:  Blood pressure 111/73, pulse 95, temperature 99.1 F (37.3 C), temperature source Oral, resp. rate 18, height 5\' 9"  (1.753 m), weight 166 lb 10.3 oz (75.588 kg), SpO2 95.00%.    Labs: Results for orders placed during the hospital encounter of 06/29/13 (from the past 48 hour(s))  GLUCOSE, CAPILLARY     Status: Abnormal   Collection Time    07/06/13 11:25 AM      Result Value Range   Glucose-Capillary 124 (*) 70 - 99 mg/dL  GLUCOSE, CAPILLARY     Status: Abnormal   Collection Time    07/06/13  4:09 PM      Result Value Range   Glucose-Capillary 156 (*) 70 - 99 mg/dL  GLUCOSE, CAPILLARY     Status: Abnormal   Collection Time    07/06/13  8:27 PM      Result Value Range   Glucose-Capillary 175 (*) 70 - 99 mg/dL  BASIC METABOLIC PANEL     Status: Abnormal   Collection Time    07/07/13  6:10 AM      Result Value Range   Sodium 140  135 - 145 mEq/L   Potassium 3.5  3.5 - 5.1 mEq/L   Chloride 104  96 - 112 mEq/L   CO2 29  19 - 32 mEq/L   Glucose, Bld 89  70 - 99 mg/dL   BUN 26 (*) 6 - 23 mg/dL   Creatinine, Ser 0.98  0.50 - 1.35 mg/dL   Calcium 7.5 (*) 8.4 - 10.5 mg/dL   GFR calc non Af Amer 73 (*) >90 mL/min   GFR calc Af Amer 85 (*) >90 mL/min   Comment: (NOTE)     The eGFR has been calculated using the CKD EPI equation.     This calculation has not been validated in all clinical situations.     eGFR's persistently <90 mL/min signify possible Chronic Kidney     Disease.  PROTIME-INR     Status: Abnormal   Collection Time    07/07/13  6:10 AM      Result Value Range   Prothrombin Time 16.4 (*) 11.6  - 15.2 seconds   INR 1.36  0.00 - 1.49  CBC     Status: Abnormal   Collection Time    07/07/13  6:10 AM      Result Value Range   WBC 6.5  4.0 - 10.5 K/uL   RBC 3.96 (*) 4.22 - 5.81 MIL/uL   Hemoglobin 12.4 (*) 13.0 - 17.0 g/dL   HCT 36.7 (*) 39.0 - 52.0 %   MCV 92.7  78.0 - 100.0 fL   MCH 31.3  26.0 - 34.0 pg   MCHC 33.8  30.0 - 36.0 g/dL   RDW 17.1 (*) 11.5 - 15.5 %   Platelets 97 (*) 150 - 400 K/uL   Comment: CONSISTENT WITH PREVIOUS RESULT  GLUCOSE, CAPILLARY     Status: None   Collection Time    07/07/13  7:42 AM      Result Value Range   Glucose-Capillary 88  70 - 99 mg/dL  GLUCOSE, CAPILLARY     Status: None   Collection Time    07/07/13 10:59 AM      Result Value Range   Glucose-Capillary 82  70 - 99 mg/dL  GLUCOSE, CAPILLARY     Status: Abnormal   Collection Time    07/07/13  4:36 PM      Result Value Range   Glucose-Capillary 126 (*) 70 - 99 mg/dL  GLUCOSE, CAPILLARY     Status: Abnormal   Collection Time    07/07/13  9:36 PM      Result Value Range   Glucose-Capillary 218 (*) 70 - 99 mg/dL  BASIC METABOLIC PANEL     Status: Abnormal   Collection Time    07/08/13  5:30 AM      Result Value Range   Sodium 139  137 - 147 mEq/L   Potassium 5.6 (*) 3.7 - 5.3 mEq/L   Comment: DELTA CHECK NOTED     SLIGHT HEMOLYSIS   Chloride 101  96 - 112 mEq/L   CO2 29  19 - 32 mEq/L   Glucose, Bld 93  70 - 99 mg/dL   BUN 24 (*) 6 - 23 mg/dL   Creatinine, Ser  0.97  0.50 - 1.35 mg/dL   Calcium 7.1 (*) 8.4 - 10.5 mg/dL   GFR calc non Af Amer 74 (*) >90 mL/min   GFR calc Af Amer 85 (*) >90 mL/min   Comment: (NOTE)     The eGFR has been calculated using the CKD EPI equation.     This calculation has not been validated in all clinical situations.     eGFR's persistently <90 mL/min signify possible Chronic Kidney     Disease.  PROTIME-INR     Status: Abnormal   Collection Time    07/08/13  5:30 AM      Result Value Range   Prothrombin Time 16.0 (*) 11.6 - 15.2 seconds    INR 1.31  0.00 - 1.49  CBC     Status: Abnormal   Collection Time    07/08/13  5:30 AM      Result Value Range   WBC 9.4  4.0 - 10.5 K/uL   RBC 4.23  4.22 - 5.81 MIL/uL   Hemoglobin 13.1  13.0 - 17.0 g/dL   HCT 39.7  39.0 - 52.0 %   MCV 93.9  78.0 - 100.0 fL   MCH 31.0  26.0 - 34.0 pg   MCHC 33.0  30.0 - 36.0 g/dL   RDW 17.1 (*) 11.5 - 15.5 %   Platelets 106 (*) 150 - 400 K/uL   Comment: CONSISTENT WITH PREVIOUS RESULT  GLUCOSE, CAPILLARY     Status: None   Collection Time    07/08/13  7:53 AM      Result Value Range   Glucose-Capillary 92  70 - 99 mg/dL    Disposition:      Follow-up Information   Follow up with Erlene Quan, PA-C On 07/14/2013. (10:40)    Specialty:  Cardiology   Contact information:   [address] [place] [address] [phone]       Follow up with Tommy Medal, [place] On 07/14/2013. (10:40 - INR check)    Contact information:   [address] [place] [address] [phone]       Follow up with [facility], MD. (call his office)    Specialty:  Internal Medicine   Contact information:   [place] [address] [phone]       Discharge Medications:    Medication List    STOP taking these medications       ONGLYZA 5 MG Tabs tablet  Generic drug:  saxagliptin HCl      TAKE these medications       acetaminophen 325 MG tablet  Commonly known as:  TYLENOL  Take 1-2 tablets (325-650 mg total) by mouth every 4 (four) hours as needed for mild pain.     budesonide-formoterol 160-4.5 MCG/ACT inhaler  Commonly known as:  SYMBICORT  Inhale 2 puffs into the lungs 2 (two) times daily.     carbidopa-levodopa 25-100 MG per tablet  Commonly known as:  SINEMET IR  Take 1 tablet by mouth 3 (three) times daily.     digoxin 0.125 MG tablet  Commonly known as:  LANOXIN  Take 0.125 mg by mouth daily.     fish oil-omega-3 fatty acids 1000 MG capsule  Take 2 g by mouth daily.      furosemide 20 MG tablet  Commonly known as:  LASIX  Take 20 mg by mouth daily.     linagliptin 5 MG Tabs tablet  Commonly known  as:  TRADJENTA  Take 5 mg by mouth daily.     metoprolol succinate 100 MG 24 hr tablet  Commonly known as:  TOPROL-XL  Take 50 mg by mouth daily. Take with or immediately following a meal.     vitamin E 400 UNIT capsule  Take 400 Units by mouth daily.     warfarin 5 MG tablet  Commonly known as:  COUMADIN  Take 5 mg by mouth as directed.         Duration of Discharge Encounter: Greater than 30 minutes including physician time.  Angelena Form PA-C 07/08/2013 10:56 AM  Overall doing well. Edema all but resolved.  Cath images reviewed - thankfully, no CAD -- this will allow for safe completion of OP Dobutamine Echo (but would perform at least Limited Echo 1st to determine effect of BiVPM/ AVN Ablation on EF & thus AS gradient).  PPM site intact; will need EP wound check.  Continue BB & Digoxin. Not on ACE-I/ARB -- can be assessed on OP f/u.   INR subtherapeutic - Warfarin restarted last PM @ home dose. Will need close f/u (checked by PCP) by Friday along with chem 7 (to reassess K+). Hold K+ supplement until Friday.  CHF notably improved from a Sx standpoint. Back on home PO Lasix dose.   Clinically stable for D/C today -- PT eval recommended HH PT - ordered.  Will need close f/u as OP --> PCP for INR; Wound Check Appt scheduled (along with device/Wound Care Instructions in AVS by EP Team); Will need close f/u ~next week with APP (on day with Dr. Sallyanne Kuster @ [facility]) - to reassess HF Sx & Medications, then planning f/u Echo Eval (Relook Echo +/- Dobutamine Echo) to assess severity of AS.  Restart Home DM regimen on d/c.  Continue Home PD regimen.   Leonie Man, M.D., M.S. [facility] [address]. [place], [place]  [address]  [phone] Pager # [phone] 07/08/2013 4:17 PM

## 2013-07-08 NOTE — Progress Notes (Signed)
Subjective:  Feels well.  Has yet to ambulate today.  PT postponed eval due to sheath removal yesterday. Cath revealed non-obstructive CAD.  Objective:  Vital Signs in the last 24 hours: Temp:  [98.6 F (37 C)-99.1 F (37.3 C)] 99.1 F (37.3 C) (12/30 0510) Pulse Rate:  [94-97] 95 (12/30 0510) Resp:  [18] 18 (12/30 0510) BP: (101-129)/(60-89) 111/73 mmHg (12/30 0510) SpO2:  [95 %] 95 % (12/30 0510) Weight:  [166 lb 10.3 oz (75.588 kg)] 166 lb 10.3 oz (75.588 kg) (12/30 0510)  Intake/Output from previous day: 12/29 0701 - 12/30 0700 In: 268.1 [I.V.:268.1] Out: 200 [Urine:200] Intake/Output from this shift:    Physical Exam: General appearance: alert, cooperative, appears stated age, no distress and somewhat frail appearing. Neck: no carotid bruit, no JVD and supple, symmetrical, trachea midline Lungs: clear to auscultation bilaterally, normal percussion bilaterally and non-labored Heart: regular rate and rhythm, S1: normal, S2: decreased intensity and ~1-2/6 SEM c-d @ RUSB (not as strong as expected) Abdomen: soft, non-tender; bowel sounds normal; no masses,  no organomegaly Extremities: extremities normal, atraumatic, no cyanosis or edema and has gauze dressing on RLE Pulses: mildly diminished, but palpable t/o. Neurologic: Grossly normal RFA Cath site c/d/i - no ecchymoses or bruit; PPM site intact - no ecchymoses.  Lab Results:  Recent Labs  07/07/13 0610 07/08/13 0530  WBC 6.5 9.4  HGB 12.4* 13.1  PLT 97* 106*    Recent Labs  07/07/13 0610 07/08/13 0530  NA 140 139  K 3.5 5.6*  CL 104 101  CO2 29 29  GLUCOSE 89 93  BUN 26* 24*  CREATININE 0.98 0.97   Imaging: Imaging results have been reviewed; post PPM CXR without PTX  Cardiac Studies: Cath & Echo reviewed. (I personally reviewed the images); Ao Valve not crossed.  Assessment/Plan:  Principal Problem:   Acute on chronic systolic congestive heart failure Active Problems:   Non-ischemic  cardiomyopathy - EF 20-25% Echo 06/30/13; Cath - no obstructive CAD 07/07/13   Permanent atrial fibrillation- AVN RFA 07/05/13   Biventricular cardiac pacemaker upgrade 07/05/13 (St Jude)   Aortic stenosis-  low gradient severe AS   Current use of long term anticoagulation   Diabetes mellitus without complication   Parkinson's disease  Overall doing well.  Edema all but resolved. Cath images reviewed - thankfully, no CAD -- this will allow for safe completion of OP Dobutamine Echo (but would perform at least Limited Echo 1st to determine effect of BiVPM/ AVN Ablation on EF & thus AS gradient).    PPM site intact; will need EP wound check. Continue BB & Digoxin.  Not on ACE-I/ARB -- can be assessed on OP f/u.  INR subtherapeutic - Warfarin restarted last PM @ home dose.  Will need close f/u (checked by PCP) by Friday along with chem 7 (to reassess K+).  Hold K+ supplement until Friday.  CHF notably improved from a Sx standpoint.  Back on home PO Lasix dose.  Clinically stable for D/C today -- PT eval still pending to determine stability & safety for d/c +/- HH PT etc.  Will need close f/u as OP --> PCP for INR; Wound Check Appt scheduled (along with device/Wound Care Instructions in AVS by EP Team); Will need close f/u ~next week with APP (on day with Dr. Sallyanne Kuster @ [facility]) - to reassess HF Sx & Medications, then planning f/u Echo Eval (Relook Echo +/- Dobutamine Echo) to assess severity of AS.  Restart Home DM regimen  on d/c. Continue Home PD regimen.   LOS: 9 days    [name] [name] 07/08/2013, 9:43 AM

## 2013-07-08 NOTE — Progress Notes (Signed)
ANTICOAGULATION CONSULT NOTE - Follow-up Consult  Pharmacy Consult for Coumadin Indication: atrial fibrillation  Allergies  Allergen Reactions  . Amiodarone Other (See Comments)    Amiodarone induced hepatitis    Patient Measurements: Height: 5\' 9"  (175.3 cm) Weight: 166 lb 10.3 oz (75.588 kg) IBW/kg (Calculated) : 70.7  Vital Signs: Temp: 99.1 F (37.3 C) (12/30 0510) Temp src: Oral (12/30 0510) BP: 111/73 mmHg (12/30 0510) Pulse Rate: 95 (12/30 0510)  Labs:  Recent Labs  07/06/13 0500 07/07/13 0610 07/08/13 0530  HGB 12.6* 12.4* 13.1  HCT 38.1* 36.7* 39.7  PLT 91* 97* 106*  LABPROT 18.2* 16.4* 16.0*  INR 1.55* 1.36 1.31  CREATININE 1.09 0.98 0.97    Estimated Creatinine Clearance: 56.7 ml/min (by C-G formula based on Cr of 0.97).   Medical History: Past Medical History  Diagnosis Date  . Atrial fibrillation     echo- EF 35-40%; LA severely dilated; RA mod dilated, RV systolic pressure 0000000; LV systolic fcn mod reduced  . Atrial fibrillation     stress test - post stress EF 28%, AFib w/ RVR and LBBB, apical and septal thinning; increased RV free wall uptake consistent w/ pressure and/or volume overload  . Aortic valvular stenoses     not symtomatic  . SSS (sick sinus syndrome)     pacemaker placed in 1993  . Diabetes mellitus without complication      Assessment: 79 y.o. male presented with edema, SOB, rapid afib. Pt on coumadin 5mg  daily PTA for afib. Admission INR 7.97. Coumadin held for BiV ICD with AVN ablation on 12/26 and left heart cath on 12/29.  Pharmacy consulted to resume coumadin on Monday 12/29.  No heparin bridge ordered due to recent BiV placement.  INR today is 1.31.   PLTC remains low at 106.  No bleeding reported.  Goal of Therapy:  INR 2-3  Plan:  1. repeat Warfarin 5 mg po x1 tonight 2. Daily INR 3. Monitor for signs/sx of bleed [name], Pharm.D. [id number] 07/08/2013 8:25 AM

## 2013-07-08 NOTE — Progress Notes (Signed)
Physical Therapy Treatment Patient Details Name: [name] [name] MRN: [id number] DOB: [birth year] Today's Date: 07/08/2013 Time: [id number] PT Time Calculation (min): 20 min  PT Assessment / Plan / Recommendation  History of Present Illness THis 79 y.o. male with known LV dysfunction admitted with increasing dyspnea and increased LE edema.  Pt found to be in Afib with RVR   PT Comments   Patient tolerated ambulation on level ground and also onto a small step well with A for safety.  Patient will require 24 hour supervision once d/c to home to ensure safety with mobility and ambulation.  This discussed with patient and his wife who are both in agreement.  Patient still has weakness that will benefit from skilled PT to address these limitations in function.   Follow Up Recommendations  Supervision/Assistance - 24 hour;Home health PT           Equipment Recommendations  Rolling walker with 5" wheels;3in1 (PT)       Frequency Min 3X/week   Progress towards PT Goals Progress towards PT goals: Progressing toward goals  Plan Current plan remains appropriate    Precautions / Restrictions Precautions Precautions: Fall Restrictions Weight Bearing Restrictions: No   Pertinent Vitals/Pain None reported, HR at 103 bpm prior to session, up to 95 with walking.    Mobility  Bed Mobility Bed Mobility: Not assessed Transfers Transfers: Sit to Stand;Stand to Sit Sit to Stand: From chair/3-in-1;3: Mod assist;With upper extremity assist Stand to Sit: 4: Min guard;With upper extremity assist;To chair/3-in-1 Details for Transfer Assistance: Requires more A getting up from chair than he has previously needed when getting up from the bed because the chair is lower than the bed.  Requires A at hips to help with standing.  Patient pushes through  both UE to assist in standing and sitting Ambulation/Gait Ambulation/Gait Assistance: 4: Min guard Ambulation Distance (Feet): 150 Feet Assistive device:  Rolling walker Ambulation/Gait Assistance Details: Did a good job of changing speeds while ambulating because wife inadverently would change speeds while moving IV pole.  Rest break standing x1 initiated by PT to check vitals. Gait Pattern: Step-through pattern;Decreased stride length Gait velocity: decreased Stairs: Yes Stairs Assistance: 4: Min guard Stair Management Technique: Forwards;With walker;No rails;Step to pattern (Went up on a small step multiple times to imitate home environment of stoop getting into house.  Performed with PT guarding, and also wife guarding to ensure safety and comfort with transition to home. Number of Stairs: 1      PT Goals (current goals can now be found in the care plan section) Acute Rehab PT Goals Patient Stated Goal: to get home PT Goal Formulation: With patient Time For Goal Achievement: 07/11/13 Potential to Achieve Goals: Good  Visit Information  Last PT Received On: 07/08/13 Assistance Needed: +1 History of Present Illness: THis 79 y.o. male with known LV dysfunction admitted with increasing dyspnea and increased LE edema.  Pt found to be in Afib with RVR    Subjective Data  Patient Stated Goal: to get home   Cognition  Cognition Arousal/Alertness: Awake/alert Behavior During Therapy: WFL for tasks assessed/performed Overall Cognitive Status: Within Functional Limits for tasks assessed Memory: Decreased short-term memory (poor recall of locating room)       End of Session PT - End of Session Equipment Utilized During Treatment: Gait belt Activity Tolerance: Patient limited by fatigue Patient left: in bed;with call bell/phone within reach;with family/visitor present (sitting on EOB) Nurse Communication: Mobility status   GP  Cordelia Poche, SPT Pager:  [phone]  Cordelia Poche 07/08/2013, 12:20 PM

## 2013-07-08 NOTE — Progress Notes (Signed)
Occupational Therapy Treatment Patient Details Name: [name] [name] MRN: [id number] DOB: 08-21-1926 Today's Date: 07/08/2013 Time: [id number] OT Time Calculation (min): 23 min  OT Assessment / Plan / Recommendation  History of present illness THis 79 y.o. male with known LV dysfunction admitted with increasing dyspnea and increased LE edema.  Pt found to be in Afib with RVR   OT comments  Pt progressing toward goals and demonstrates decr activity tolerance with ambulation. Pt requires x2 rest breaks. Pt with HR 124 max heart during session. Recommendation for DME ( 3n1, RW, HHOT)  Follow Up Recommendations  Home health OT;Supervision - Intermittent    Barriers to Discharge       Equipment Recommendations  3 in 1 bedside comode;Other (comment) (RW)    Recommendations for Other Services    Frequency Min 2X/week   Progress towards OT Goals Progress towards OT goals: Progressing toward goals  Plan Discharge plan remains appropriate    Precautions / Restrictions Precautions Precautions: Fall   Pertinent Vitals/Pain HR 124     ADL  Eating/Feeding: Modified independent Where Assessed - Eating/Feeding: Chair Upper Body Dressing: Minimal assistance Where Assessed - Upper Body Dressing: Unsupported sitting (lt ue weakness noted) Toilet Transfer: Minimal assistance Toilet Transfer Method: Sit to stand Toilet Transfer Equipment: Regular height toilet Equipment Used: Gait belt;Rolling walker Transfers/Ambulation Related to ADLs: Pt ambulated ~ 100 ft with LOB to the right and left with head turns. Pt required mOD (A) to correct LOB . Pt fatigued and requried two rest breaks  ADL Comments: Pt sitting eob on arrival and requesting a RW. pt with LOB with ambulation. pt with decr ROM with LT UE at this time. Pt and wife educated to dress LT UE first. Pt and wife with small one step into the car port. Recommendation for RW and 3n1 made to CM with HHOT.     OT Diagnosis:    OT Problem List:    OT Treatment Interventions:     OT Goals(current goals can now be found in the care plan section) Acute Rehab OT Goals Patient Stated Goal: To get better OT Goal Formulation: With patient Time For Goal Achievement: 07/16/13 Potential to Achieve Goals: Good ADL Goals Pt Will Perform Grooming: with modified independence;standing Pt Will Perform Upper Body Bathing: with modified independence;sitting Pt Will Perform Lower Body Bathing: with modified independence;sit to/from stand Pt Will Perform Upper Body Dressing: with modified independence;sitting Pt Will Perform Lower Body Dressing: with modified independence;sit to/from stand Pt Will Transfer to Toilet: with modified independence;ambulating;regular height toilet;grab bars Pt Will Perform Toileting - Clothing Manipulation and hygiene: with modified independence;sit to/from stand Pt Will Perform Tub/Shower Transfer: Tub transfer;with modified independence;ambulating;rolling walker  Visit Information  Last OT Received On: 07/08/13 Assistance Needed: +1 History of Present Illness: THis 79 y.o. male with known LV dysfunction admitted with increasing dyspnea and increased LE edema.  Pt found to be in Afib with RVR    Subjective Data      Prior Functioning       Cognition  Cognition Arousal/Alertness: Awake/alert Behavior During Therapy: WFL for tasks assessed/performed Overall Cognitive Status: Within Functional Limits for tasks assessed Memory: Decreased short-term memory (poor recall of locating room)    Mobility  Bed Mobility Bed Mobility: Not assessed Transfers Transfers: Sit to Stand;Stand to Sit Sit to Stand: 4: Min assist;With upper extremity assist;From bed Stand to Sit: With upper extremity assist;To bed;4: Min guard Details for Transfer Assistance: Pt placing LT UE on RW iwth  RT UE and pushign from bed with RT UE. pt reaching for surface of chair with BIL UE without cues    Exercises      Balance     End of  Session OT - End of Session Activity Tolerance: Patient tolerated treatment well Patient left: in chair;with call bell/phone within reach;with family/visitor present Nurse Communication: Mobility status;Precautions  GO     Peri Maris 07/08/2013, 10:18 AM Pager: [phone]

## 2013-07-14 ENCOUNTER — Ambulatory Visit (INDEPENDENT_AMBULATORY_CARE_PROVIDER_SITE_OTHER): Payer: Medicare Other | Admitting: *Deleted

## 2013-07-14 ENCOUNTER — Ambulatory Visit (INDEPENDENT_AMBULATORY_CARE_PROVIDER_SITE_OTHER): Payer: Medicare Other | Admitting: Cardiology

## 2013-07-14 ENCOUNTER — Ambulatory Visit (INDEPENDENT_AMBULATORY_CARE_PROVIDER_SITE_OTHER): Payer: Medicare Other | Admitting: Pharmacist Clinician (PhC)/ Clinical Pharmacy Specialist

## 2013-07-14 ENCOUNTER — Encounter: Payer: Self-pay | Admitting: Cardiology

## 2013-07-14 VITALS — BP 102/60 | HR 88 | Ht 69.0 in | Wt 162.0 lb

## 2013-07-14 DIAGNOSIS — I509 Heart failure, unspecified: Secondary | ICD-10-CM

## 2013-07-14 DIAGNOSIS — I350 Nonrheumatic aortic (valve) stenosis: Secondary | ICD-10-CM

## 2013-07-14 DIAGNOSIS — Z7901 Long term (current) use of anticoagulants: Secondary | ICD-10-CM

## 2013-07-14 DIAGNOSIS — I5023 Acute on chronic systolic (congestive) heart failure: Secondary | ICD-10-CM

## 2013-07-14 DIAGNOSIS — I4891 Unspecified atrial fibrillation: Secondary | ICD-10-CM

## 2013-07-14 DIAGNOSIS — Z950 Presence of cardiac pacemaker: Secondary | ICD-10-CM

## 2013-07-14 DIAGNOSIS — I428 Other cardiomyopathies: Secondary | ICD-10-CM

## 2013-07-14 DIAGNOSIS — I4821 Permanent atrial fibrillation: Secondary | ICD-10-CM

## 2013-07-14 DIAGNOSIS — I359 Nonrheumatic aortic valve disorder, unspecified: Secondary | ICD-10-CM

## 2013-07-14 LAB — POCT INR: INR: 2.1

## 2013-07-14 MED ORDER — FUROSEMIDE 40 MG PO TABS: 40.0000 mg | ORAL_TABLET | Freq: Every day | ORAL | Status: DC

## 2013-07-14 NOTE — Assessment & Plan Note (Signed)
He needs a Dobutamine echo to further evaluate this.

## 2013-07-14 NOTE — Patient Instructions (Addendum)
Your physician recommends that you schedule a follow-up appointment on 08-21-2013 @ 11:00AM with the device clinic on [facility].  Don't eat breakfast Wed morning. Increase your Furosemide to 40 mg daily.

## 2013-07-14 NOTE — Assessment & Plan Note (Signed)
Recent hospitalization- currently compensated though he does still have some lower extremity edema.

## 2013-07-14 NOTE — Progress Notes (Signed)
07/14/2013 [name] [name]   09-23-1926  [id number]  Primary Physician : Dr Lucilla Lame Primary Cardiologist: Dr Sallyanne Kuster  HPI:  The patient is an 80 year old man from [place] who was previously followed by Dr Rollene Fare. Marland Kitchen He has a history of symptomatic bradycardia, status post pacemaker 1993 with a gen change in 2006. He has had chronic atrial fibrillation for many years. 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 He was admitted 06/29/13 with uncontrolled atrial fibrillation and worsening heart failure symptoms as well as hypotension which limited his rate control options. An echo 06/30/13 revealed severe LVD with an EF of 20-25% and low gradient severe AS. He was seen in consult by Dr Lovena Le who recommended AVN RFA and an up grade to a BiV device. This was done 07/05/13 and the patient tolerated this well. Dr Lovena Le did not feel it would be safe to do a Rt heart cath with his new leads. He did have a coronary angiogram 07/07/13 showed no significant CAD. He is here in the office for follow up. He denies SOB. He does have lower extremity edema. His pacer site is well healed. His rate was decreased from 90 to 80.     Current Outpatient Prescriptions  Medication Sig Dispense Refill  . acetaminophen (TYLENOL) 325 MG tablet Take 1-2 tablets (325-650 mg total) by mouth every 4 (four) hours as needed for mild pain.      . budesonide-formoterol (SYMBICORT) 160-4.5 MCG/ACT inhaler Inhale 2 puffs into the lungs 2 (two) times daily.      . carbidopa-levodopa (SINEMET IR) 25-100 MG per tablet Take 1 tablet by mouth 3 (three) times daily.      . digoxin (LANOXIN) 0.125 MG tablet Take 0.125 mg by mouth daily.      . fish oil-omega-3 fatty acids 1000 MG capsule Take 2 g by mouth daily.      Marland Kitchen linagliptin (TRADJENTA) 5 MG TABS tablet Take 5 mg by mouth daily.      . metoprolol succinate (TOPROL-XL) 100 MG 24 hr tablet Take 50 mg by mouth daily. Take  with or immediately following a meal.      . vitamin E 400 UNIT capsule Take 400 Units by mouth daily.      Marland Kitchen warfarin (COUMADIN) 5 MG tablet Take 5 mg by mouth as directed.       No current facility-administered medications for this visit.    Allergies  Allergen Reactions  . Amiodarone Other (See Comments)    Amiodarone induced hepatitis    History   Social History  . Marital Status: Widowed    Spouse Name: N/A    Number of Children: N/A  . Years of Education: N/A   Occupational History  . Not on file.   Social History Main Topics  . Smoking status: Former Smoker    Quit date: 07/09/1969  . Smokeless tobacco: Never Used  . Alcohol Use: No  . Drug Use: No  . Sexual Activity: Not on file   Other Topics Concern  . Not on file   Social History Narrative   Widower.  Retired Hydrographic surveyor.     Review of Systems: General: negative for chills, fever, night sweats or weight changes.  Cardiovascular: negative for chest pain, dyspnea on exertion,  orthopnea, palpitations, paroxysmal nocturnal dyspnea or shortness of breath Dermatological: negative for rash Respiratory: negative for cough or wheezing Urologic: negative for hematuria Abdominal: negative for  nausea, vomiting, diarrhea, bright red blood per rectum, melena, or hematemesis Neurologic: negative for visual changes, syncope, or dizziness All other systems reviewed and are otherwise negative except as noted above.    Blood pressure 102/60, pulse 88, height 5\' 9"  (1.753 m), weight 162 lb (73.483 kg).  General appearance: alert, cooperative, appears stated age and no distress Lungs: few rales Rt base. Pacer site without hematoma Heart: regular rate and rhythm Extremities: 2+ bilat pretibial edema   ASSESSMENT AND PLAN:   Acute on chronic systolic congestive heart failure Recent hospitalization- currently compensated though he does still have some lower extremity edema.   Permanent atrial fibrillation- AVN RFA  07/05/13 .  Biventricular cardiac pacemaker upgrade 07/05/13 (St Jude) .  Non-ischemic cardiomyopathy - EF 20-25% Echo 06/30/13; Cath - no obstructive CAD 07/07/13 .  Aortic stenosis-  low gradient severe AS He needs a Dobutamine echo to further evaluate this.   Current use of long term anticoagulation .   PLAN  Discussed with Dr Sallyanne Kuster. He needs a Dobutamine echo and this can be done this Wednesday at the hospital. I did increase his Lasix to 40 mg for his edema though we'll have to be careful with his B/P.  He needs a BMP Wednesday.   Gael Londo KPA-C 07/14/2013 11:30 AM

## 2013-07-16 ENCOUNTER — Ambulatory Visit (HOSPITAL_COMMUNITY)
Admission: RE | Admit: 2013-07-16 | Discharge: 2013-07-16 | Disposition: A | Discharge status: Home / Self Care | Payer: Medicare Other | Source: Ambulatory Visit | Attending: Surgery | Admitting: Surgery

## 2013-07-16 ENCOUNTER — Encounter: Payer: Self-pay | Admitting: Cardiovascular Disease

## 2013-07-16 DIAGNOSIS — I509 Heart failure, unspecified: Secondary | ICD-10-CM | POA: Insufficient documentation

## 2013-07-16 DIAGNOSIS — Z950 Presence of cardiac pacemaker: Secondary | ICD-10-CM | POA: Insufficient documentation

## 2013-07-16 DIAGNOSIS — E119 Type 2 diabetes mellitus without complications: Secondary | ICD-10-CM | POA: Insufficient documentation

## 2013-07-16 DIAGNOSIS — I350 Nonrheumatic aortic (valve) stenosis: Secondary | ICD-10-CM

## 2013-07-16 DIAGNOSIS — I359 Nonrheumatic aortic valve disorder, unspecified: Secondary | ICD-10-CM | POA: Insufficient documentation

## 2013-07-16 DIAGNOSIS — I4891 Unspecified atrial fibrillation: Secondary | ICD-10-CM | POA: Insufficient documentation

## 2013-07-16 DIAGNOSIS — I428 Other cardiomyopathies: Secondary | ICD-10-CM | POA: Insufficient documentation

## 2013-07-16 LAB — MDC_IDC_ENUM_SESS_TYPE_INCLINIC
Battery Voltage: 2.98 V
Date Time Interrogation Session: 20150105164605
Implantable Pulse Generator Model: 3242
Implantable Pulse Generator Serial Number: 7547478
Lead Channel Impedance Value: 475 Ohm
Lead Channel Impedance Value: 487.5 Ohm
Lead Channel Pacing Threshold Amplitude: 0.5 V
Lead Channel Pacing Threshold Amplitude: 1.25 V
Lead Channel Pacing Threshold Pulse Width: 0.4 ms
Lead Channel Pacing Threshold Pulse Width: 0.8 ms
Lead Channel Sensing Intrinsic Amplitude: 5.1 mV
Lead Channel Setting Pacing Amplitude: 3.5 V
Lead Channel Setting Pacing Pulse Width: 0.8 ms
Lead Channel Setting Sensing Sensitivity: 2 mV
MDC IDC MSMT LEADCHNL LV PACING THRESHOLD AMPLITUDE: 1.25 V
MDC IDC MSMT LEADCHNL LV PACING THRESHOLD PULSEWIDTH: 0.8 ms
MDC IDC MSMT LEADCHNL RV PACING THRESHOLD AMPLITUDE: 0.5 V
MDC IDC MSMT LEADCHNL RV PACING THRESHOLD PULSEWIDTH: 0.4 ms
MDC IDC SET LEADCHNL RV PACING AMPLITUDE: 3.5 V
MDC IDC SET LEADCHNL RV PACING PULSEWIDTH: 0.4 ms
MDC IDC STAT BRADY RA PERCENT PACED: 0 %
MDC IDC STAT BRADY RV PERCENT PACED: 98 %

## 2013-07-16 MED ORDER — SODIUM CHLORIDE 0.9 % IV SOLN
INTRAVENOUS | Status: DC
  Administered 2013-07-16: 11:00:00 via INTRAVENOUS

## 2013-07-16 MED ORDER — DOBUTAMINE INFUSION FOR EP/ECHO/NUC (1000 MCG/ML)
0.0000 ug/kg/min | INTRAVENOUS | Status: DC
  Administered 2013-07-16: 10 ug/kg/min via INTRAVENOUS

## 2013-07-16 NOTE — Progress Notes (Signed)
  Echocardiogram Echocardiogram Pharmacologic Stress Test has been performed.  [place], [facility] 07/16/2013, 11:32 AM

## 2013-07-16 NOTE — Progress Notes (Signed)
vs. 1.0 @ 0.4 at impant. PW increased to 0.94ms. Threshold measurement was seen at 1.25 @0 .8. PW permanently programmed. Corvue enabled. Device programmed at 3.5V for extra safety margin until 3 month visit. Histogram distribution appropriate for patient and level of activity. Permanent AF (s/p AVN ablation) + Warfarin. No high ventricular rates noted. Patient educated about wound care, arm mobility, lifting restrictions. Base rate lowered from 90 to 80. ROV in 1 month to lower base rate from 80 to 70.

## 2013-07-16 NOTE — OR Nursing (Signed)
Dobutamine stress test done to evaluate the AS.  Pt denies pain or discomfort.  A maximum dose of 30 mcg/kg was reached.  HR 90, BP 123/55 Study stopped at 1122.

## 2013-07-17 MED FILL — Dobutamine in Dextrose 5% Inj 2 MG/ML: INTRAVENOUS | Qty: 250 | Status: AC

## 2013-07-22 ENCOUNTER — Encounter: Payer: Medicare Other | Admitting: Cardiovascular Disease

## 2013-07-25 ENCOUNTER — Telehealth: Payer: Self-pay | Admitting: Cardiovascular Disease

## 2013-07-25 NOTE — Telephone Encounter (Signed)
Forwarded to B. Lassiter, CMA.  

## 2013-07-25 NOTE — Telephone Encounter (Signed)
She would like to extend his nursing visits to 3 more weeks please.

## 2013-07-28 NOTE — Telephone Encounter (Signed)
[name] [name] with [facility] feels she needs to see [name] [name] once a week for the next 3 weeks now that PT and OT are done to make sure he has no issues w/his meds and/or diet.  Order given.

## 2013-07-31 ENCOUNTER — Encounter: Payer: Self-pay | Admitting: Internal Medicine

## 2013-08-13 ENCOUNTER — Telehealth: Payer: Self-pay | Admitting: *Deleted

## 2013-08-13 NOTE — Telephone Encounter (Signed)
Signed order for skilled nursing visits with [facility] faxed.

## 2013-08-21 ENCOUNTER — Ambulatory Visit (INDEPENDENT_AMBULATORY_CARE_PROVIDER_SITE_OTHER): Payer: Medicare Other | Admitting: *Deleted

## 2013-08-21 DIAGNOSIS — I428 Other cardiomyopathies: Secondary | ICD-10-CM

## 2013-08-21 DIAGNOSIS — I4821 Permanent atrial fibrillation: Secondary | ICD-10-CM

## 2013-08-21 DIAGNOSIS — I4891 Unspecified atrial fibrillation: Secondary | ICD-10-CM

## 2013-08-21 LAB — MDC_IDC_ENUM_SESS_TYPE_INCLINIC
Battery Remaining Longevity: 26.4 mo
Brady Statistic RA Percent Paced: 0 %
Brady Statistic RV Percent Paced: 99.25 %
Implantable Pulse Generator Model: 3242
Implantable Pulse Generator Serial Number: 7547478
Lead Channel Pacing Threshold Amplitude: 0.5 V
Lead Channel Pacing Threshold Amplitude: 0.5 V
Lead Channel Pacing Threshold Pulse Width: 0.4 ms
Lead Channel Sensing Intrinsic Amplitude: 5 mV
Lead Channel Setting Pacing Amplitude: 3.5 V
MDC IDC MSMT BATTERY VOLTAGE: 2.96 V
MDC IDC MSMT LEADCHNL LV IMPEDANCE VALUE: 540 Ohm
MDC IDC MSMT LEADCHNL LV PACING THRESHOLD AMPLITUDE: 1.25 V
MDC IDC MSMT LEADCHNL LV PACING THRESHOLD PULSEWIDTH: 1.5 ms
MDC IDC MSMT LEADCHNL RA PACING THRESHOLD AMPLITUDE: 0.5 V
MDC IDC MSMT LEADCHNL RA PACING THRESHOLD PULSEWIDTH: 0.4 ms
MDC IDC MSMT LEADCHNL RA SENSING INTR AMPL: 5 mV
MDC IDC MSMT LEADCHNL RV IMPEDANCE VALUE: 600 Ohm
MDC IDC MSMT LEADCHNL RV PACING THRESHOLD PULSEWIDTH: 0.4 ms
MDC IDC SESS DTM: 20150212112519
MDC IDC SET LEADCHNL LV PACING AMPLITUDE: 3.5 V
MDC IDC SET LEADCHNL LV PACING PULSEWIDTH: 1.5 ms
MDC IDC SET LEADCHNL RV PACING PULSEWIDTH: 0.4 ms
MDC IDC SET LEADCHNL RV SENSING SENSITIVITY: 2 mV

## 2013-08-21 NOTE — Progress Notes (Signed)
CRT-P device check in clinic. Normal device function. Thresholds, sensing, impedance consistent with previous measurements. Histograms appropriate for patient and level of activity.  Base rate to 70.   1 ventricular high rate episode, 11 seconds. Patient bi-ventricularly pacing >100% of the time. Device programmed with appropriate safety margins. Device heart failure diagnostics are within normal limits and stable over time. Estimated longevity 2.2 years. Patient enrolled in remote follow-up/TTM's with [employer]. Plan to check device remotely in 3 months and every 6 months in office.  ROV 1 months with Dr. Sallyanne Kuster.

## 2013-09-05 ENCOUNTER — Encounter: Payer: Self-pay | Admitting: Internal Medicine

## 2013-09-22 ENCOUNTER — Ambulatory Visit (INDEPENDENT_AMBULATORY_CARE_PROVIDER_SITE_OTHER): Payer: Medicare Other | Admitting: Cardiovascular Disease

## 2013-09-22 ENCOUNTER — Encounter: Payer: Self-pay | Admitting: Cardiovascular Disease

## 2013-09-22 VITALS — BP 136/80 | HR 83 | Resp 16 | Ht 69.5 in | Wt 156.8 lb

## 2013-09-22 DIAGNOSIS — Z950 Presence of cardiac pacemaker: Secondary | ICD-10-CM

## 2013-09-22 DIAGNOSIS — I359 Nonrheumatic aortic valve disorder, unspecified: Secondary | ICD-10-CM

## 2013-09-22 DIAGNOSIS — I5023 Acute on chronic systolic (congestive) heart failure: Secondary | ICD-10-CM

## 2013-09-22 DIAGNOSIS — I4891 Unspecified atrial fibrillation: Secondary | ICD-10-CM

## 2013-09-22 DIAGNOSIS — I4821 Permanent atrial fibrillation: Secondary | ICD-10-CM

## 2013-09-22 DIAGNOSIS — I428 Other cardiomyopathies: Secondary | ICD-10-CM

## 2013-09-22 DIAGNOSIS — I350 Nonrheumatic aortic (valve) stenosis: Secondary | ICD-10-CM

## 2013-09-22 DIAGNOSIS — I509 Heart failure, unspecified: Secondary | ICD-10-CM

## 2013-09-22 LAB — MDC_IDC_ENUM_SESS_TYPE_INCLINIC
Battery Remaining Longevity: 63.6 mo
Battery Voltage: 2.96 V
Brady Statistic RV Percent Paced: 98 %
Implantable Pulse Generator Model: 3242
Lead Channel Pacing Threshold Amplitude: 0.5 V
Lead Channel Pacing Threshold Amplitude: 0.5 V
Lead Channel Pacing Threshold Pulse Width: 0.4 ms
Lead Channel Pacing Threshold Pulse Width: 0.4 ms
Lead Channel Pacing Threshold Pulse Width: 1.5 ms
Lead Channel Pacing Threshold Pulse Width: 1.5 ms
Lead Channel Sensing Intrinsic Amplitude: 12 mV
Lead Channel Setting Pacing Amplitude: 2 V
Lead Channel Setting Pacing Amplitude: 2.5 V
Lead Channel Setting Pacing Pulse Width: 0.4 ms
Lead Channel Setting Pacing Pulse Width: 1.5 ms
Lead Channel Setting Sensing Sensitivity: 2 mV
MDC IDC MSMT LEADCHNL LV IMPEDANCE VALUE: 675 Ohm
MDC IDC MSMT LEADCHNL LV PACING THRESHOLD AMPLITUDE: 1.25 V
MDC IDC MSMT LEADCHNL LV PACING THRESHOLD AMPLITUDE: 1.25 V
MDC IDC MSMT LEADCHNL RV IMPEDANCE VALUE: 600 Ohm
MDC IDC PG SERIAL: 7547478
MDC IDC SESS DTM: 20150316125740
MDC IDC STAT BRADY RA PERCENT PACED: 0 %

## 2013-09-22 LAB — PACEMAKER DEVICE OBSERVATION

## 2013-09-22 MED ORDER — LISINOPRIL 5 MG PO TABS: 5.0000 mg | ORAL_TABLET | Freq: Every day | ORAL | Status: DC

## 2013-09-22 NOTE — Patient Instructions (Addendum)
START Lisinopril 5mg  daily.  The prescription has been sent to your pharmacy.   Remote monitoring is used to monitor your pacemaker from home. This monitoring reduces the number of office visits required to check your device to one time per year. It allows [place] to keep an eye on the functioning of your device to ensure it is working properly. You are scheduled for a device check from home on 12-24-2013. You may send your transmission at any time that day. If you have a wireless device, the transmission will be sent automatically. After your physician reviews your transmission, you will receive a postcard with your next transmission date.  You have been referred to the "ICM clinic" with [name] [name]. She will contact you regarding your heart failure diagnostics in your pacemaker.  Your physician recommends that you schedule a follow-up appointment in: 6 months with Dr. Sallyanne Kuster.

## 2013-09-28 ENCOUNTER — Encounter: Payer: Self-pay | Admitting: Cardiovascular Disease

## 2013-09-28 NOTE — Assessment & Plan Note (Signed)
Pacemaker dependent status post AV node ablation. We may have to reconsider the role of digoxin and his therapy since it is no longer providing any assistance with ventricular rate control. It may be of some benefit for his cardiomyopathy. I have a low threshold to discontinue this medication if there is any suspicion of toxicity. Note however that his renal function has improved. Continue anticoagulation (INR monitored by Dr. Delena Bali).

## 2013-09-28 NOTE — Progress Notes (Signed)
Patient ID: [name] [name], male   DOB: 02/22/1927, 80 y.o.   MRN: [id number]      Reason for office visit Chronic systolic heart failure, low gradient severe stenosis, nonischemic cardiomyopathy, permanent atrial fibrillation, CRT-P. followup  [name] [name] is a former patient of Dr. Terance Ice who is here to establish new cardiology followup. He has severe structural heart disease but has recovered a reasonably good functional status. He denies any chest pain shortness of breath, edema, dizziness, syncope or angina. He was last hospitalized in December with congestive heart failure. Treatment for rapid ventricular rate and congestive heart failure was limited by hypotension.  He has long-standing permanent atrial fibrillation. Attempts at maintenance of sinus rhythm failed, including complications of amiodarone-induced hepatitis. His dual chamber pacemaker was implanted in 1993, but is now programmed VVIR. In September 2014 he underwent AV nodal radiofrequency ablation the device was upgraded to a biventricular device (St. Jude). Coronary angiography performed December 2014 shows no obstructive lesions. Left ventricular ejection fraction is 20-25%.  A dobutamine echocardiogram is performed in January. This showed some myocardial reserve with only mild increase in aortic valve gradients after inotropic stimulation, suggesting that the cardiomyopathy was not directly related to aortic stenosis. Nevertheless there is at least moderate aortic stenosis. At peak dobutamine dose the left ventricular ejection fraction increased from 20-25% to 25-30%. At baseline the peak and mean aortic valve gradients were 35 and 21 mm Hg respectively, at peak dobutamine dose the peak gradient was 59 mm Hg and the mean gradient was 23 mm Hg. The estimated aortic valve are increased from 0.7 cm square at baseline to 0.9 cm square at peak dobutamine dose.  Right heart catheterization was not performed in order to avoid  disruption of the newly placed left ventricular lead.  Laboratory tests performed by Dr. Delena Bali in August of 2014 showed a creatinine of 1.49, hemoglobin 16.2, normal liver function tests, total cholesterol 145, triglycerides 143, HDL 36, LDL 80, hemoglobin A1c 7.3%.  At the time of discharge from his December hospitalization his creatinine had decreased to 0.97.  Interrogation of his CRT-P. device today shows 90% biventricular pacing (background atrial fibrillation with complete heart block, no escape rhythm at a heart rate of 30 beats per minute). There've been no episodes of ventricular tachycardia. Lead parameters are good. Please see separate pacemaker report.   Allergies  Allergen Reactions  . Amiodarone Other (See Comments)    Amiodarone induced hepatitis    Current Outpatient Prescriptions  Medication Sig Dispense Refill  . acetaminophen (TYLENOL) 325 MG tablet Take 1-2 tablets (325-650 mg total) by mouth every 4 (four) hours as needed for mild pain.      . budesonide-formoterol (SYMBICORT) 160-4.5 MCG/ACT inhaler Inhale 2 puffs into the lungs 2 (two) times daily.      . carbidopa-levodopa (SINEMET IR) 25-100 MG per tablet Take 1 tablet by mouth 3 (three) times daily.      . digoxin (LANOXIN) 0.125 MG tablet Take 0.125 mg by mouth daily.      . fish oil-omega-3 fatty acids 1000 MG capsule Take 2 g by mouth daily.      . furosemide (LASIX) 40 MG tablet Take 1 tablet (40 mg total) by mouth daily.  90 tablet  3  . linagliptin (TRADJENTA) 5 MG TABS tablet Take 5 mg by mouth daily.      . metoprolol succinate (TOPROL-XL) 100 MG 24 hr tablet Take 50 mg by mouth daily. Take with or immediately following a  meal.      . vitamin E 400 UNIT capsule Take 400 Units by mouth daily.      Marland Kitchen warfarin (COUMADIN) 5 MG tablet Take 5 mg by mouth as directed.      Marland Kitchen lisinopril (PRINIVIL,ZESTRIL) 5 MG tablet Take 1 tablet (5 mg total) by mouth daily.  90 tablet  3  . mupirocin ointment (BACTROBAN) 2 %  as needed.       No current facility-administered medications for this visit.    Past Medical History  Diagnosis Date  . Atrial fibrillation     echo- EF 35-40%; LA severely dilated; RA mod dilated, RV systolic pressure 0000000; LV systolic fcn mod reduced  . Atrial fibrillation     stress test - post stress EF 28%, AFib w/ RVR and LBBB, apical and septal thinning; increased RV free wall uptake consistent w/ pressure and/or volume overload  . Aortic valvular stenoses     not symtomatic  . SSS (sick sinus syndrome)     pacemaker placed in 1993  . Diabetes mellitus without complication     Past Surgical History  Procedure Laterality Date  . Pacemaker insertion  1993  . Biv pacemaker generator change out  11/2004  . Hernia repair      No family history on file.  History   Social History  . Marital Status: Widowed    Spouse Name: N/A    Number of Children: N/A  . Years of Education: N/A   Occupational History  . Not on file.   Social History Main Topics  . Smoking status: Former Smoker    Quit date: 07/09/1969  . Smokeless tobacco: Never Used  . Alcohol Use: No  . Drug Use: No  . Sexual Activity: Not on file   Other Topics Concern  . Not on file   Social History Narrative   Widower.  Retired Hydrographic surveyor.    Review of systems: The patient specifically denies any chest pain at rest or with exertion, dyspnea at rest or with exertion, orthopnea, paroxysmal nocturnal dyspnea, syncope, palpitations, focal neurological deficits, intermittent claudication, lower extremity edema, unexplained weight gain, cough, hemoptysis or wheezing.  The patient also denies abdominal pain, nausea, vomiting, dysphagia, diarrhea, constipation, polyuria, polydipsia, dysuria, hematuria, frequency, urgency, abnormal bleeding or bruising, fever, chills, unexpected weight changes, mood swings, change in skin or hair texture, change in voice quality, auditory or visual problems, allergic reactions  or rashes, new musculoskeletal complaints other than usual "aches and pains".   PHYSICAL EXAM BP 136/80  Pulse 83  Resp 16  Ht 5' 9.5" (1.765 m)  Wt 71.124 kg (156 lb 12.8 oz)  BMI 22.83 kg/m2  General: Alert, oriented x3, no distress Head: no evidence of trauma, PERRL, EOMI, no exophtalmos or lid lag, no myxedema, no xanthelasma; normal ears, nose and oropharynx Neck: normal jugular venous pulsations and no hepatojugular reflux; brisk carotid pulses without delay and no carotid bruits Chest: clear to auscultation, no signs of consolidation by percussion or palpation, normal fremitus, symmetrical and full respiratory excursions, well-healed subclavian pacemaker site Cardiovascular: normal position and quality of the apical impulse, regular rhythm, normal first and second heart sounds, no rubs or gallops, 1/6 systolic ejection murmur in the aortic focus, mid peaking Abdomen: no tenderness or distention, no masses by palpation, no abnormal pulsatility or arterial bruits, normal bowel sounds, no hepatosplenomegaly Extremities: no clubbing, cyanosis or edema; 2+ radial, ulnar and brachial pulses bilaterally; 2+ right femoral, posterior tibial and dorsalis pedis  pulses; 2+ left femoral, posterior tibial and dorsalis pedis pulses; no subclavian or femoral bruits Neurological: grossly nonfocal   EKG: Date fibrillation, biventricular pacing QTC 486  Lipid Panel   total cholesterol 145, triglycerides 143, HDL 36, LDL 80, hemoglobin A1c 7.3%.   BMET    Component Value Date/Time   NA 139 07/08/2013 0530   K 5.6* 07/08/2013 0530   CL 101 07/08/2013 0530   CO2 29 07/08/2013 0530   GLUCOSE 93 07/08/2013 0530   BUN 24* 07/08/2013 0530   CREATININE 0.97 07/08/2013 0530   CALCIUM 7.1* 07/08/2013 0530   GFRNONAA 74* 07/08/2013 0530   GFRAA 85* 07/08/2013 0530     ASSESSMENT AND PLAN Biventricular cardiac pacemaker upgrade 07/05/13 (St Jude) Normally functioning CRT-P. device implanted by  Dr. Lovena Le. He is enrolled in remote device monitoring as well as remote thoracic impedance monitoring for heart failure.  Aortic stenosis-  low gradient severe AS He has at least moderate aortic stenosis, possibly severe. Nevertheless at this point the cardiomyopathy appears to be the dominant problem. It is possible that he will require treatment for aortic stenosis in the near future. This should be preceded by right heart catheterization after his pacemaker leads have had a chance to mature. At this point he is not symptomatic (albeit with a very sedentary lifestyle).  Permanent atrial fibrillation- AVN RFA 07/05/13 Pacemaker dependent status post AV node ablation. We may have to reconsider the role of digoxin and his therapy since it is no longer providing any assistance with ventricular rate control. It may be of some benefit for his cardiomyopathy. I have a low threshold to discontinue this medication if there is any suspicion of toxicity. Note however that his renal function has improved. Continue anticoagulation (INR monitored by Dr. Delena Bali).   Orders Placed This Encounter  Procedures  . Implantable device check  . EKG 12-Lead   Meds ordered this encounter  Medications  . mupirocin ointment (BACTROBAN) 2 %    Sig: as needed.  Marland Kitchen lisinopril (PRINIVIL,ZESTRIL) 5 MG tablet    Sig: Take 1 tablet (5 mg total) by mouth daily.    Dispense:  90 tablet    Refill:  Morral Finnigan Warriner, MD, [facility] [phone] office [phone] pager

## 2013-09-28 NOTE — Assessment & Plan Note (Signed)
Normally functioning CRT-P. device implanted by Dr. Lovena Le. He is enrolled in remote device monitoring as well as remote thoracic impedance monitoring for heart failure.

## 2013-09-28 NOTE — Assessment & Plan Note (Signed)
He has at least moderate aortic stenosis, possibly severe. Nevertheless at this point the cardiomyopathy appears to be the dominant problem. It is possible that he will require treatment for aortic stenosis in the near future. This should be preceded by right heart catheterization after his pacemaker leads have had a chance to mature. At this point he is not symptomatic (albeit with a very sedentary lifestyle).

## 2013-09-29 ENCOUNTER — Telehealth: Payer: Self-pay | Admitting: *Deleted

## 2013-09-29 NOTE — Telephone Encounter (Signed)
I contacted the patient to enroll him in the monthly ICM clinic. He does not wish to participate at this time.

## 2013-12-24 ENCOUNTER — Telehealth: Payer: Self-pay | Admitting: Cardiovascular Disease

## 2013-12-24 ENCOUNTER — Telehealth: Payer: Self-pay | Admitting: Cardiology

## 2013-12-24 ENCOUNTER — Encounter: Payer: Self-pay | Admitting: Cardiovascular Disease

## 2013-12-24 ENCOUNTER — Ambulatory Visit (INDEPENDENT_AMBULATORY_CARE_PROVIDER_SITE_OTHER): Payer: Medicare Other | Admitting: *Deleted

## 2013-12-24 DIAGNOSIS — I428 Other cardiomyopathies: Secondary | ICD-10-CM

## 2013-12-24 DIAGNOSIS — I4891 Unspecified atrial fibrillation: Secondary | ICD-10-CM

## 2013-12-24 DIAGNOSIS — I4821 Permanent atrial fibrillation: Secondary | ICD-10-CM

## 2013-12-24 LAB — MDC_IDC_ENUM_SESS_TYPE_REMOTE
Battery Voltage: 2.99 V
Brady Statistic RV Percent Paced: 98 %
Implantable Pulse Generator Model: 3242
Implantable Pulse Generator Serial Number: 7547478
Lead Channel Impedance Value: 600 Ohm
Lead Channel Impedance Value: 810 Ohm
Lead Channel Sensing Intrinsic Amplitude: 12 mV
Lead Channel Setting Pacing Amplitude: 2 V
Lead Channel Setting Pacing Amplitude: 2.5 V
Lead Channel Setting Pacing Pulse Width: 1.5 ms
Lead Channel Setting Sensing Sensitivity: 2 mV
MDC IDC MSMT BATTERY REMAINING LONGEVITY: 74 mo
MDC IDC SET LEADCHNL RV PACING PULSEWIDTH: 0.4 ms

## 2013-12-24 NOTE — Telephone Encounter (Signed)
Patient informed that remote was received. 

## 2013-12-24 NOTE — Telephone Encounter (Signed)
Spoke with pt and reminded pt of remote transmission that is due today. Pt verbalized understanding.   

## 2013-12-24 NOTE — Telephone Encounter (Signed)
Suppose to have had his pacemaker checked over the phone this morning at 9. What number does he call?

## 2013-12-24 NOTE — Telephone Encounter (Signed)
Called pt and informed him that we received transmission.

## 2013-12-24 NOTE — Progress Notes (Signed)
Remote pacemaker transmission.   

## 2013-12-24 NOTE — Telephone Encounter (Signed)
New Prob    Pt is having some difficulty transmitting and would like some assistance. Please call.

## 2014-01-21 ENCOUNTER — Encounter: Payer: Self-pay | Admitting: Cardiology

## 2014-01-28 ENCOUNTER — Telehealth: Payer: Self-pay | Admitting: Cardiovascular Disease

## 2014-01-28 NOTE — Telephone Encounter (Signed)
Closed encounter °

## 2014-03-30 ENCOUNTER — Telehealth: Payer: Self-pay | Admitting: Cardiology

## 2014-03-30 ENCOUNTER — Encounter: Payer: Medicare Other | Admitting: *Deleted

## 2014-03-30 NOTE — Telephone Encounter (Signed)
Pt has appt on 10-27 with MD to have pacemaker checked then.

## 2014-03-31 ENCOUNTER — Encounter: Payer: Self-pay | Admitting: Cardiology

## 2014-04-07 ENCOUNTER — Telehealth: Payer: Self-pay | Admitting: Cardiovascular Disease

## 2014-04-07 NOTE — Telephone Encounter (Signed)
Instructed pt how to send manual transmission.

## 2014-04-07 NOTE — Telephone Encounter (Signed)
New Message  Pt called to discuss sending a transmission. Received letter that the transmission did not go through. Please call back to discuss.

## 2014-05-05 ENCOUNTER — Encounter: Payer: Self-pay | Admitting: Cardiovascular Disease

## 2014-05-05 ENCOUNTER — Ambulatory Visit (INDEPENDENT_AMBULATORY_CARE_PROVIDER_SITE_OTHER): Payer: Medicare Other | Admitting: Cardiovascular Disease

## 2014-05-05 VITALS — BP 126/80 | HR 70 | Resp 20 | Ht 69.5 in | Wt 166.4 lb

## 2014-05-05 DIAGNOSIS — I4821 Permanent atrial fibrillation: Secondary | ICD-10-CM

## 2014-05-05 DIAGNOSIS — Z7901 Long term (current) use of anticoagulants: Secondary | ICD-10-CM

## 2014-05-05 DIAGNOSIS — I5023 Acute on chronic systolic (congestive) heart failure: Secondary | ICD-10-CM

## 2014-05-05 DIAGNOSIS — I429 Cardiomyopathy, unspecified: Secondary | ICD-10-CM

## 2014-05-05 DIAGNOSIS — I428 Other cardiomyopathies: Secondary | ICD-10-CM

## 2014-05-05 DIAGNOSIS — Z950 Presence of cardiac pacemaker: Secondary | ICD-10-CM

## 2014-05-05 DIAGNOSIS — I482 Chronic atrial fibrillation: Secondary | ICD-10-CM

## 2014-05-05 LAB — MDC_IDC_ENUM_SESS_TYPE_INCLINIC
Date Time Interrogation Session: 20151027124643
Implantable Pulse Generator Model: 3242
Lead Channel Impedance Value: 537.5 Ohm
Lead Channel Impedance Value: 700 Ohm
Lead Channel Pacing Threshold Amplitude: 0.75 V
Lead Channel Pacing Threshold Amplitude: 0.75 V
Lead Channel Pacing Threshold Amplitude: 0.75 V
Lead Channel Pacing Threshold Pulse Width: 0.4 ms
Lead Channel Pacing Threshold Pulse Width: 1.5 ms
Lead Channel Sensing Intrinsic Amplitude: 9.5 mV
Lead Channel Setting Pacing Amplitude: 2 V
Lead Channel Setting Pacing Pulse Width: 0.4 ms
Lead Channel Setting Pacing Pulse Width: 1.5 ms
Lead Channel Setting Sensing Sensitivity: 2 mV
MDC IDC MSMT BATTERY REMAINING LONGEVITY: 75.6 mo
MDC IDC MSMT BATTERY VOLTAGE: 2.98 V
MDC IDC MSMT LEADCHNL LV PACING THRESHOLD PULSEWIDTH: 1.5 ms
MDC IDC MSMT LEADCHNL RV PACING THRESHOLD AMPLITUDE: 0.75 V
MDC IDC MSMT LEADCHNL RV PACING THRESHOLD PULSEWIDTH: 0.4 ms
MDC IDC PG SERIAL: 7547478
MDC IDC SET LEADCHNL RV PACING AMPLITUDE: 2.5 V
MDC IDC STAT BRADY RA PERCENT PACED: 0 %
MDC IDC STAT BRADY RV PERCENT PACED: 99 %

## 2014-05-05 NOTE — Patient Instructions (Addendum)
Remote monitoring is used to monitor your pacemaker from home. This monitoring reduces the number of office visits required to check your device to one time per year. It allows [place] to keep an eye on the functioning of your device to ensure it is working properly. You are scheduled for a device check from home on 08/06/2014. You may send your transmission at any time that day. If you have a wireless device, the transmission will be sent automatically. After your physician reviews your transmission, you will receive a postcard with your next transmission date.  Your physician recommends that you schedule a follow-up appointment in: 6 months with Dr.Croitoru

## 2014-05-05 NOTE — Progress Notes (Signed)
Patient ID: [name] [name], male   DOB: 03-May-1927, 80 y.o.   MRN: [id number]     Reason for office visit Chronic systolic heart failure, low gradient aortic stenosis, nonischemic cardiomyopathy, permanent atrial fibrillation, CRT-P. followup   [name] has severe structural heart disease but has reasonably good functional status. He denies any chest pain, shortness of breath, edema, dizziness, syncope or angina.   He was last hospitalized in December with congestive heart failure. Treatment for rapid ventricular rate and congestive heart failure was limited by hypotension. He has long-standing permanent atrial fibrillation. Attempts at maintenance of sinus rhythm failed, including complications of amiodarone-induced hepatitis. His dual chamber pacemaker was implanted in 1993, but is now programmed VVIR. In September 2014 he underwent AV nodal radiofrequency ablation the device was upgraded to a biventricular device (St. Jude). Coronary angiography performed December 2014 shows no obstructive lesions. Left ventricular ejection fraction is 20-25%. A dobutamine echocardiogram is performed in January. This showed some myocardial reserve with only mild increase in aortic valve gradients after inotropic stimulation, suggesting that the cardiomyopathy was not directly related to aortic stenosis. Nevertheless, there is at least moderate aortic stenosis. At peak dobutamine dose the left ventricular ejection fraction increased from 20-25% to 25-30%. At baseline the peak and mean aortic valve gradients were 35 and 21 mm Hg respectively, at peak dobutamine dose the peak gradient was 59 mm Hg and the mean gradient was 23 mm Hg. The estimated aortic valve are increased from 0.7 cm square at baseline to 0.9 cm square at peak dobutamine dose. Right heart catheterization was not performed in order to avoid disruption of the newly placed left ventricular lead.   Laboratory tests performed by Dr. Delena Bali in August of 2014  showed a creatinine of 1.49, hemoglobin 16.2, normal liver function tests, total cholesterol 145, triglycerides 143, HDL 36, LDL 80, hemoglobin A1c 7.3%. At the time of discharge from his December hospitalization his creatinine had decreased to 0.97.   Interrogation of his CRT-P. device today shows 100% biventricular pacing (background atrial fibrillation with complete heart block, no escape rhythm at a heart rate of 30 beats per minute). There've been no episodes of ventricular tachycardia. Lead parameters are good. Generator longevity estimated to be 6-7 years.      Allergies  Allergen Reactions  . Amiodarone Other (See Comments)    Amiodarone induced hepatitis    Current Outpatient Prescriptions  Medication Sig Dispense Refill  . acetaminophen (TYLENOL) 325 MG tablet Take 1-2 tablets (325-650 mg total) by mouth every 4 (four) hours as needed for mild pain.      . budesonide-formoterol (SYMBICORT) 160-4.5 MCG/ACT inhaler Inhale 2 puffs into the lungs 2 (two) times daily as needed.       . carbidopa-levodopa (SINEMET IR) 25-100 MG per tablet Take 1 tablet by mouth 3 (three) times daily.      . digoxin (LANOXIN) 0.125 MG tablet Take 0.125 mg by mouth daily.      . fish oil-omega-3 fatty acids 1000 MG capsule Take 2 g by mouth daily.      . furosemide (LASIX) 40 MG tablet Take 1 tablet (40 mg total) by mouth daily.  90 tablet  3  . linagliptin (TRADJENTA) 5 MG TABS tablet Take 5 mg by mouth daily.      Marland Kitchen lisinopril (PRINIVIL,ZESTRIL) 5 MG tablet Take 1 tablet (5 mg total) by mouth daily.  90 tablet  3  . metoprolol succinate (TOPROL-XL) 100 MG 24 hr tablet Take 50  mg by mouth daily. Take with or immediately following a meal.      . mupirocin ointment (BACTROBAN) 2 % as needed.      . vitamin E 400 UNIT capsule Take 400 Units by mouth daily.      Marland Kitchen warfarin (COUMADIN) 5 MG tablet Take 5 mg by mouth as directed.       No current facility-administered medications for this visit.    Past  Medical History  Diagnosis Date  . Atrial fibrillation     echo- EF 35-40%; LA severely dilated; RA mod dilated, RV systolic pressure 0000000; LV systolic fcn mod reduced  . Atrial fibrillation     stress test - post stress EF 28%, AFib w/ RVR and LBBB, apical and septal thinning; increased RV free wall uptake consistent w/ pressure and/or volume overload  . Aortic valvular stenoses     not symtomatic  . SSS (sick sinus syndrome)     pacemaker placed in 1993  . Diabetes mellitus without complication     Past Surgical History  Procedure Laterality Date  . Pacemaker insertion  1993  . Biv pacemaker generator change out  11/2004  . Hernia repair      No family history on file.  History   Social History  . Marital Status: Widowed    Spouse Name: N/A    Number of Children: N/A  . Years of Education: N/A   Occupational History  . Not on file.   Social History Main Topics  . Smoking status: Former Smoker    Quit date: 07/09/1969  . Smokeless tobacco: Never Used  . Alcohol Use: No  . Drug Use: No  . Sexual Activity: Not on file   Other Topics Concern  . Not on file   Social History Narrative   Widower.  Retired Hydrographic surveyor.    Review of systems: The patient specifically denies any chest pain at rest or with exertion, dyspnea at rest or with exertion, orthopnea, paroxysmal nocturnal dyspnea, syncope, palpitations, focal neurological deficits, intermittent claudication, lower extremity edema, unexplained weight gain, cough, hemoptysis or wheezing.  The patient also denies abdominal pain, nausea, vomiting, dysphagia, diarrhea, constipation, polyuria, polydipsia, dysuria, hematuria, frequency, urgency, abnormal bleeding or bruising, fever, chills, unexpected weight changes, mood swings, change in skin or hair texture, change in voice quality, auditory or visual problems, allergic reactions or rashes, new musculoskeletal complaints other than usual "aches and pains".   PHYSICAL  EXAM BP 126/80  Pulse 70  Resp 20  Ht 5' 9.5" (1.765 m)  Wt 166 lb 6.4 oz (75.479 kg)  BMI 24.23 kg/m2 General: Alert, oriented x3, no distress  Head: no evidence of trauma, PERRL, EOMI, no exophtalmos or lid lag, no myxedema, no xanthelasma; normal ears, nose and oropharynx  Neck: normal jugular venous pulsations and no hepatojugular reflux; brisk carotid pulses without delay and no carotid bruits  Chest: clear to auscultation, no signs of consolidation by percussion or palpation, normal fremitus, symmetrical and full respiratory excursions, well-healed subclavian pacemaker site  Cardiovascular: normal position and quality of the apical impulse, regular rhythm, normal first and second heart sounds, no rubs or gallops, 1/6 systolic ejection murmur in the aortic focus, mid peaking  Abdomen: no tenderness or distention, no masses by palpation, no abnormal pulsatility or arterial bruits, normal bowel sounds, no hepatosplenomegaly  Extremities: no clubbing, cyanosis or edema; 2+ radial, ulnar and brachial pulses bilaterally; 2+ right femoral, posterior tibial and dorsalis pedis pulses; 2+ left femoral, posterior tibial  and dorsalis pedis pulses; no subclavian or femoral bruits  Neurological: grossly nonfocal   EKG: Background atrial fibrillation, biventricular pacing  Lipid Panel  No results found for this basename: chol, trig, hdl, cholhdl, vldl, ldlcalc, ldldirect    BMET    Component Value Date/Time   NA 139 07/08/2013 0530   K 5.6* 07/08/2013 0530   CL 101 07/08/2013 0530   CO2 29 07/08/2013 0530   GLUCOSE 93 07/08/2013 0530   BUN 24* 07/08/2013 0530   CREATININE 0.97 07/08/2013 0530   CALCIUM 7.1* 07/08/2013 0530   GFRNONAA 74* 07/08/2013 0530   GFRAA 85* 07/08/2013 0530     ASSESSMENT AND PLAN  Nonischemic cardiomyopathy/chronic combined systolic and diastolic heart failure NYHA class II, clinically euvolemic on a relatively low dose of loop diuretic. On appropriate  treatment with ACE inhibitor and beta blocker.  Biventricular cardiac pacemaker upgrade 07/05/13 [facility] Jude)  Normally functioning CRT-P. device implanted by Dr. Lovena Le. He is enrolled in remote device monitoring as well as remote thoracic impedance monitoring for heart failure.   Aortic stenosis- low gradient severe AS  He has at least moderate aortic stenosis, possibly severe. Nevertheless at this point the cardiomyopathy appears to be the dominant problem. It is possible that he will require treatment for aortic stenosis in the near future. This should be preceded by right heart catheterization after his pacemaker leads have had a chance to mature. At this point he is not symptomatic (albeit with a very sedentary lifestyle).   Permanent atrial fibrillation- AVN RFA 07/05/13  Pacemaker dependent status post AV node ablation. Low threshold to discontinue digoxin if there is any suspicion of toxicity. Continue anticoagulation (INR monitored by Dr. Delena Bali).  Orders Placed This Encounter  Procedures  . Implantable device check  . EKG 12-Lead   No orders of the defined types were placed in this encounter.    Holli Humbles, MD, [place] [phone] office [phone] pager

## 2014-05-15 ENCOUNTER — Encounter: Payer: Self-pay | Admitting: Internal Medicine

## 2014-06-18 ENCOUNTER — Encounter (HOSPITAL_COMMUNITY): Payer: Self-pay | Admitting: Internal Medicine

## 2014-08-06 ENCOUNTER — Ambulatory Visit (INDEPENDENT_AMBULATORY_CARE_PROVIDER_SITE_OTHER): Payer: Medicare Other | Admitting: *Deleted

## 2014-08-06 ENCOUNTER — Encounter: Payer: Self-pay | Admitting: Cardiovascular Disease

## 2014-08-06 DIAGNOSIS — I4821 Permanent atrial fibrillation: Secondary | ICD-10-CM

## 2014-08-06 DIAGNOSIS — I482 Chronic atrial fibrillation: Secondary | ICD-10-CM

## 2014-08-06 LAB — MDC_IDC_ENUM_SESS_TYPE_REMOTE
Battery Remaining Percentage: 87 %
Battery Voltage: 2.98 V
Date Time Interrogation Session: 20160128160630
Implantable Pulse Generator Serial Number: 7547478
Lead Channel Pacing Threshold Amplitude: 0.75 V
Lead Channel Pacing Threshold Pulse Width: 0.4 ms
Lead Channel Pacing Threshold Pulse Width: 1.5 ms
Lead Channel Sensing Intrinsic Amplitude: 12 mV
Lead Channel Setting Pacing Amplitude: 2.5 V
Lead Channel Setting Pacing Pulse Width: 0.4 ms
Lead Channel Setting Pacing Pulse Width: 1.5 ms
MDC IDC MSMT BATTERY REMAINING LONGEVITY: 73 mo
MDC IDC MSMT LEADCHNL LV IMPEDANCE VALUE: 750 Ohm
MDC IDC MSMT LEADCHNL RV IMPEDANCE VALUE: 580 Ohm
MDC IDC MSMT LEADCHNL RV PACING THRESHOLD AMPLITUDE: 0.75 V
MDC IDC PG MODEL: 3242
MDC IDC SET LEADCHNL LV PACING AMPLITUDE: 2 V
MDC IDC SET LEADCHNL RV SENSING SENSITIVITY: 2 mV

## 2014-08-06 NOTE — Progress Notes (Signed)
Remote pacemaker transmission.   

## 2014-08-19 ENCOUNTER — Encounter: Payer: Self-pay | Admitting: Cardiology

## 2014-10-05 ENCOUNTER — Telehealth: Payer: Self-pay | Admitting: Cardiovascular Disease

## 2014-10-12 NOTE — Telephone Encounter (Signed)
Close encounter 

## 2014-10-13 ENCOUNTER — Other Ambulatory Visit: Payer: Self-pay | Admitting: Cardiovascular Disease

## 2014-10-13 NOTE — Telephone Encounter (Signed)
Rx(s) sent to pharmacy electronically.  

## 2014-11-06 ENCOUNTER — Encounter: Payer: Self-pay | Admitting: Cardiovascular Disease

## 2014-11-06 ENCOUNTER — Ambulatory Visit (INDEPENDENT_AMBULATORY_CARE_PROVIDER_SITE_OTHER): Payer: Medicare Other | Admitting: Cardiovascular Disease

## 2014-11-06 VITALS — BP 130/82 | HR 92 | Ht 69.5 in | Wt 162.3 lb

## 2014-11-06 DIAGNOSIS — I429 Cardiomyopathy, unspecified: Secondary | ICD-10-CM | POA: Diagnosis not present

## 2014-11-06 DIAGNOSIS — I4821 Permanent atrial fibrillation: Secondary | ICD-10-CM

## 2014-11-06 DIAGNOSIS — I5023 Acute on chronic systolic (congestive) heart failure: Secondary | ICD-10-CM | POA: Diagnosis not present

## 2014-11-06 DIAGNOSIS — I482 Chronic atrial fibrillation: Secondary | ICD-10-CM

## 2014-11-06 DIAGNOSIS — I428 Other cardiomyopathies: Secondary | ICD-10-CM

## 2014-11-06 DIAGNOSIS — Z950 Presence of cardiac pacemaker: Secondary | ICD-10-CM

## 2014-11-06 DIAGNOSIS — I350 Nonrheumatic aortic (valve) stenosis: Secondary | ICD-10-CM

## 2014-11-06 LAB — MDC_IDC_ENUM_SESS_TYPE_INCLINIC
Battery Remaining Longevity: 76.8 mo
Brady Statistic RA Percent Paced: 0 %
Date Time Interrogation Session: 20160429153944
Implantable Pulse Generator Model: 3242
Implantable Pulse Generator Serial Number: 7547478
Lead Channel Impedance Value: 737.5 Ohm
Lead Channel Pacing Threshold Amplitude: 0.5 V
Lead Channel Pacing Threshold Amplitude: 0.75 V
Lead Channel Pacing Threshold Amplitude: 0.75 V
Lead Channel Pacing Threshold Pulse Width: 0.4 ms
Lead Channel Pacing Threshold Pulse Width: 1.5 ms
Lead Channel Pacing Threshold Pulse Width: 1.5 ms
Lead Channel Setting Pacing Amplitude: 2 V
Lead Channel Setting Pacing Amplitude: 2.5 V
Lead Channel Setting Pacing Pulse Width: 0.4 ms
Lead Channel Setting Pacing Pulse Width: 1.5 ms
Lead Channel Setting Sensing Sensitivity: 2 mV
MDC IDC MSMT BATTERY VOLTAGE: 2.98 V
MDC IDC MSMT LEADCHNL RV IMPEDANCE VALUE: 525 Ohm
MDC IDC MSMT LEADCHNL RV PACING THRESHOLD AMPLITUDE: 0.5 V
MDC IDC MSMT LEADCHNL RV PACING THRESHOLD PULSEWIDTH: 0.4 ms
MDC IDC MSMT LEADCHNL RV SENSING INTR AMPL: 8.6 mV
MDC IDC STAT BRADY RV PERCENT PACED: 98 %

## 2014-11-06 NOTE — Patient Instructions (Addendum)
Remote monitoring is used to monitor your pacemaker from home. This monitoring reduces the number of office visits required to check your device to one time per year. It allows [place] to keep an eye on the functioning of your device to ensure it is working properly. You are scheduled for a device check from home on 02-08-2015. You may send your transmission at any time that day. If you have a wireless device, the transmission will be sent automatically. After your physician reviews your transmission, you will receive a postcard with your next transmission date.  Your physician recommends that you schedule a follow-up appointment in: 12 months with Dr.Croitoru

## 2014-11-06 NOTE — Progress Notes (Signed)
Patient ID: [name] [name], male   DOB: Sep 19, 1926, 81 y.o.   MRN: [id number]      Cardiology Office Note   Date:  11/07/2014   ID:  [name] [name], DOB 1927/06/13, MRN [id number]  PCP:  Nicoletta Dress, MD  Cardiologist:    Sanda Klein, MD   Chief Complaint  Patient presents with  .  follow-up biventricular pacemaker          History of Present Illness: [name] [name] is a 81 y.o. male who presents for  Follow-up of his biventricular pacemaker. He has a long-standing history of nonischemic cardiomyopathy and symptomatic bradycardia. He initially received a pacemaker in 1993 and had a generator change out in 2006.  He has complete heart block and is pacemaker dependent following AV node ablation.Underwent upgrade to a CRT-P device in December 2014. He also has low gradient aortic stenosis but the bulk of evidence of suggested that this was not the actual cause of his cardiomyopathy.  Cardiac catheterization in 2014 showed normal coronary arteries.   Right heart catheterization was not performed to avoid dislodgment of the newly placed left ventricular lead.   He has long-standing permanent atrial fibrillation. Attempts at maintenance of sinus rhythm failed, including complications of amiodarone-induced hepatitis. In September 2014 he underwent AV nodal radiofrequency ablation the device was upgraded to a biventricular device (St. Jude). Coronary angiography performed December 2014 shows no obstructive lesions. Left ventricular ejection fraction is 20-25%.  A dobutamine echocardiogram was performed in January 2015. This showed some myocardial reserve with only mild increase in aortic valve gradients after inotropic stimulation, suggesting that the cardiomyopathy was not directly related to aortic stenosis. Nevertheless there is at least moderate aortic stenosis. At peak dobutamine dose the left ventricular ejection fraction increased from 20-25% to 25-30%. At baseline the peak and mean aortic  valve gradients were 35 and 21 mm Hg respectively, at peak dobutamine dose the peak gradient was 59 mm Hg and the mean gradient was 23 mm Hg. The estimated aortic valve are increased from 0.7 cm square at baseline to 0.9 cm square at peak dobutamine dose.  From a clinical point of view he is doing great. He walks daily for half a mile. He denies problems with exertional angina , dyspnea or dizziness. He has not experienced syncope. Denies edema or intermittent claudication.   presence of check of his CRT pacemaker today shows normal function. It is a [employer] attorney RF device.  There is 98% biventricular pacing. The LV lead is pacing in the M3-P4 configuration with threshold 0.75 V at 1.5 ms pulse width. No episodes of ventricular tachyarrhythmia have been recorded.   although there has been no change in his exercise tolerance, his thoracic impedance has shown a slow and steady decrement over the last many months. Despite this he does not notice any change in his breathing and has never had problems with swelling or edema.  Past Medical History  Diagnosis Date  . Atrial fibrillation     echo- EF 35-40%; LA severely dilated; RA mod dilated, RV systolic pressure 0000000; LV systolic fcn mod reduced  . Atrial fibrillation     stress test - post stress EF 28%, AFib w/ RVR and LBBB, apical and septal thinning; increased RV free wall uptake consistent w/ pressure and/or volume overload  . Aortic valvular stenoses     not symtomatic  . SSS (sick sinus syndrome)     pacemaker placed in 1993  . Diabetes mellitus  without complication     Past Surgical History  Procedure Laterality Date  . Pacemaker insertion  1993  . Biv pacemaker generator change out  11/2004  . Hernia repair    . Av node ablation Bilateral 07/04/2013    Procedure: AV NODE ABLATION;  Surgeon: Evans Lance, MD;  Location[facility] CATH LAB;  Service: Cardiovascular;  Laterality: Bilateral;  . Bi-ventricular pacemaker insertion  Bilateral 07/04/2013    Procedure: BI-VENTRICULAR PACEMAKER INSERTION (CRT-P);  Surgeon: Evans Lance, MD;  Location[facility] CATH LAB;  Service: Cardiovascular;  Laterality: Bilateral;  . Left heart catheterization with coronary angiogram N/A 07/07/2013    Procedure: LEFT HEART CATHETERIZATION WITH CORONARY ANGIOGRAM;  Surgeon: Pixie Casino, MD;  Location: [facility] CATH LAB;  Service: Cardiovascular;  Laterality: N/A;     Current Outpatient Prescriptions  Medication Sig Dispense Refill  . ACCU-CHEK SMARTVIEW test strip     . acetaminophen (TYLENOL) 325 MG tablet Take 1-2 tablets (325-650 mg total) by mouth every 4 (four) hours as needed for mild pain.    . budesonide-formoterol (SYMBICORT) 160-4.5 MCG/ACT inhaler Inhale 2 puffs into the lungs 2 (two) times daily as needed.     . carbidopa-levodopa (SINEMET IR) 25-100 MG per tablet Take 1 tablet by mouth 3 (three) times daily.    . digoxin (LANOXIN) 0.125 MG tablet Take 0.125 mg by mouth daily.    . fish oil-omega-3 fatty acids 1000 MG capsule Take 2 g by mouth daily.    . furosemide (LASIX) 40 MG tablet Take 1 tablet (40 mg total) by mouth daily. 90 tablet 3  . linagliptin (TRADJENTA) 5 MG TABS tablet Take 5 mg by mouth daily.    Marland Kitchen lisinopril (PRINIVIL,ZESTRIL) 5 MG tablet TAKE ONE TABLET BY MOUTH ONCE DAILY 90 tablet 1  . metoprolol succinate (TOPROL-XL) 50 MG 24 hr tablet Take 50 mg by mouth daily.    . mupirocin ointment (BACTROBAN) 2 % as needed.    . triamcinolone cream (KENALOG) 0.1 % Apply 1 application topically as needed. rash    . vitamin E 400 UNIT capsule Take 400 Units by mouth daily.    Marland Kitchen warfarin (COUMADIN) 5 MG tablet Take 5 mg by mouth as directed.     No current facility-administered medications for this visit.    Allergies:   Amiodarone    Social History:  The patient  reports that he quit smoking about 45 years ago. He has never used smokeless tobacco. He reports that he does not drink alcohol or use illicit drugs.     ROS:  Please see the history of present illness.    Otherwise, review of systems positive for none.   All other systems are reviewed and negative.    PHYSICAL EXAM: VS:  BP 130/82 mmHg  Pulse 92  Ht 5' 9.5" (1.765 m)  Wt 162 lb 4.8 oz (73.619 kg)  BMI 23.63 kg/m2 , BMI Body mass index is 23.63 kg/(m^2).  General: Alert, oriented x3, no distress Head: no evidence of trauma, PERRL, EOMI, no exophtalmos or lid lag, no myxedema, no xanthelasma; normal ears, nose and oropharynx Neck: normal jugular venous pulsations and no hepatojugular reflux; brisk carotid pulses without delay and no carotid bruits Chest: clear to auscultation, no signs of consolidation by percussion or palpation, normal fremitus, symmetrical and full respiratory excursions, well-healed subclavian pacemaker site Cardiovascular: normal position and quality of the apical impulse, regular rhythm, normal first and second heart sounds, no rubs or gallops, 1/6 systolic ejection  murmur in the aortic focus, mid peaking Abdomen: no tenderness or distention, no masses by palpation, no abnormal pulsatility or arterial bruits, normal bowel sounds, no hepatosplenomegaly Extremities: no clubbing, cyanosis or edema; 2+ radial, ulnar and brachial pulses bilaterally; 2+ right femoral, posterior tibial and dorsalis pedis pulses; 2+ left femoral, posterior tibial and dorsalis pedis pulses; no subclavian or femoral bruits Neurological: grossly nonfocal Psych: euthymic mood, full affect   EKG:  EKG is ordered today. The ekg ordered today demonstrates  Atrial sensed biventricular paced rhythm   Recent Labs: No results found for requested labs within last 365 days.    Lipid Panel No results found for: CHOL, TRIG, HDL, CHOLHDL, VLDL, LDLCALC, LDLDIRECT    Wt Readings from Last 3 Encounters:  11/06/14 162 lb 4.8 oz (73.619 kg)  05/05/14 166 lb 6.4 oz (75.479 kg)  09/22/13 156 lb 12.8 oz (71.124 kg)     ASSESSMENT AND PLAN:  1.   Biventricular cardiac pacemaker upgrade 07/05/13 [facility] Jude) Normally functioning CRT-P. device implanted by Dr. Lovena Le. He is enrolled in remote device monitoring as well as remote thoracic impedance monitoring for heart failure.  2.  Aortic stenosis- low gradient severe AS/nonischemic cardiomyopathy He has at least moderate aortic stenosis, possibly severe. Nevertheless at this point the cardiomyopathy appears to be the dominant problem. It is possible that he will require treatment for aortic stenosis in the near future. This should be preceded by right heart catheterization as his pacemaker leads have had a chance to mature. At this point he is not symptomatic (albeit with a very sedentary lifestyle).  not sure about the significance of increasing thoracic impedance , in the absence of symptoms or weight change.  3.  Permanent atrial fibrillation- AVN RFA 07/05/13 Pacemaker dependent status post AV node ablation.Low threshold to discontinue digoxin if there is any suspicion of toxicity.  Will review labs. Continue anticoagulation (INR monitored by Dr. Delena Bali).    Current medicines are reviewed at length with the patient today.  The patient does not have concerns regarding medicines.  The following changes have been made:  no change  Labs/ tests ordered today include:  Orders Placed This Encounter  Procedures  . Implantable device check    Patient Instructions  Remote monitoring is used to monitor your pacemaker from home. This monitoring reduces the number of office visits required to check your device to one time per year. It allows [place] to keep an eye on the functioning of your device to ensure it is working properly. You are scheduled for a device check from home on 02-08-2015. You may send your transmission at any time that day. If you have a wireless device, the transmission will be sent automatically. After your physician reviews your transmission, you will receive a postcard with your  next transmission date.  Your physician recommends that you schedule a follow-up appointment in: 12 months with Dr.Tashiya Souders    Signed, Sanda Klein, MD  11/07/2014 7:31 PM    Sanda Klein, MD, [facility] [phone] office [phone] pager

## 2014-11-18 ENCOUNTER — Encounter: Payer: Self-pay | Admitting: Internal Medicine

## 2015-02-08 ENCOUNTER — Telehealth: Payer: Self-pay | Admitting: Cardiovascular Disease

## 2015-02-08 ENCOUNTER — Ambulatory Visit (INDEPENDENT_AMBULATORY_CARE_PROVIDER_SITE_OTHER): Payer: Medicare Other | Admitting: *Deleted

## 2015-02-08 DIAGNOSIS — I429 Cardiomyopathy, unspecified: Secondary | ICD-10-CM | POA: Diagnosis not present

## 2015-02-08 DIAGNOSIS — I428 Other cardiomyopathies: Secondary | ICD-10-CM

## 2015-02-08 NOTE — Telephone Encounter (Signed)
Pt wants to know if his transmittal was received from this morning?

## 2015-02-08 NOTE — Telephone Encounter (Signed)
Informed patient that remote was received. Patient voiced understanding. 

## 2015-02-08 NOTE — Telephone Encounter (Signed)
Informed patient that remote was not received. Verbal instructions given on how to send manual transmission. Patient voiced understanding.

## 2015-02-09 NOTE — Progress Notes (Signed)
Remote pacemaker transmission.   

## 2015-02-12 LAB — CUP PACEART REMOTE DEVICE CHECK
Battery Voltage: 2.98 V
Brady Statistic RV Percent Paced: 99 %
Lead Channel Impedance Value: 780 Ohm
Lead Channel Pacing Threshold Pulse Width: 0.4 ms
Lead Channel Pacing Threshold Pulse Width: 1.5 ms
Lead Channel Sensing Intrinsic Amplitude: 11 mV
Lead Channel Setting Pacing Amplitude: 2 V
Lead Channel Setting Pacing Amplitude: 2.5 V
Lead Channel Setting Pacing Pulse Width: 1.5 ms
MDC IDC MSMT BATTERY REMAINING LONGEVITY: 84 mo
MDC IDC MSMT BATTERY REMAINING PERCENTAGE: 95.5 %
MDC IDC MSMT LEADCHNL LV PACING THRESHOLD AMPLITUDE: 0.75 V
MDC IDC MSMT LEADCHNL RV IMPEDANCE VALUE: 540 Ohm
MDC IDC MSMT LEADCHNL RV PACING THRESHOLD AMPLITUDE: 0.5 V
MDC IDC PG SERIAL: 7547478
MDC IDC SESS DTM: 20160801183530
MDC IDC SET LEADCHNL RV PACING PULSEWIDTH: 0.4 ms
MDC IDC SET LEADCHNL RV SENSING SENSITIVITY: 2 mV
Pulse Gen Model: 3242

## 2015-02-17 ENCOUNTER — Encounter: Payer: Self-pay | Admitting: Cardiology

## 2015-02-22 ENCOUNTER — Encounter: Payer: Self-pay | Admitting: Cardiovascular Disease

## 2015-05-12 ENCOUNTER — Ambulatory Visit (INDEPENDENT_AMBULATORY_CARE_PROVIDER_SITE_OTHER): Payer: Medicare Other | Admitting: *Deleted

## 2015-05-12 DIAGNOSIS — I482 Chronic atrial fibrillation: Secondary | ICD-10-CM | POA: Diagnosis not present

## 2015-05-12 DIAGNOSIS — I4821 Permanent atrial fibrillation: Secondary | ICD-10-CM

## 2015-05-13 NOTE — Progress Notes (Signed)
Remote pacemaker transmission.   

## 2015-05-19 ENCOUNTER — Encounter: Payer: Self-pay | Admitting: Cardiovascular Disease

## 2015-05-27 LAB — CUP PACEART REMOTE DEVICE CHECK
Battery Remaining Percentage: 95.5 %
Battery Voltage: 2.98 V
Implantable Lead Implant Date: 20141226
Implantable Lead Location: 753858
Implantable Lead Location: 753860
Lead Channel Impedance Value: 730 Ohm
Lead Channel Sensing Intrinsic Amplitude: 12 mV
Lead Channel Setting Pacing Pulse Width: 0.4 ms
Lead Channel Setting Pacing Pulse Width: 1.5 ms
MDC IDC LEAD IMPLANT DT: 20141226
MDC IDC MSMT BATTERY REMAINING LONGEVITY: 82 mo
MDC IDC MSMT LEADCHNL RV IMPEDANCE VALUE: 510 Ohm
MDC IDC PG MODEL: 3242
MDC IDC SESS DTM: 20161102134453
MDC IDC SET LEADCHNL LV PACING AMPLITUDE: 2 V
MDC IDC SET LEADCHNL RV PACING AMPLITUDE: 2.5 V
MDC IDC SET LEADCHNL RV SENSING SENSITIVITY: 2 mV
Pulse Gen Serial Number: 7547478

## 2015-05-28 ENCOUNTER — Encounter: Payer: Self-pay | Admitting: Cardiology

## 2015-08-11 ENCOUNTER — Ambulatory Visit (INDEPENDENT_AMBULATORY_CARE_PROVIDER_SITE_OTHER): Payer: Medicare Other | Admitting: *Deleted

## 2015-08-11 DIAGNOSIS — I4821 Permanent atrial fibrillation: Secondary | ICD-10-CM

## 2015-08-11 DIAGNOSIS — I482 Chronic atrial fibrillation: Secondary | ICD-10-CM

## 2015-08-11 NOTE — Progress Notes (Signed)
Remote pacemaker transmission.   

## 2015-09-06 LAB — CUP PACEART REMOTE DEVICE CHECK
Battery Remaining Longevity: 83 mo
Battery Voltage: 2.98 V
Date Time Interrogation Session: 20170201154442
Implantable Lead Implant Date: 20141226
Implantable Lead Location: 753860
Lead Channel Pacing Threshold Amplitude: 0.75 V
Lead Channel Pacing Threshold Pulse Width: 0.4 ms
Lead Channel Sensing Intrinsic Amplitude: 12 mV
Lead Channel Setting Pacing Amplitude: 2 V
Lead Channel Setting Pacing Amplitude: 2.5 V
Lead Channel Setting Sensing Sensitivity: 2 mV
MDC IDC LEAD IMPLANT DT: 20141226
MDC IDC LEAD LOCATION: 753858
MDC IDC MSMT BATTERY REMAINING PERCENTAGE: 95.5 %
MDC IDC MSMT LEADCHNL LV IMPEDANCE VALUE: 740 Ohm
MDC IDC MSMT LEADCHNL LV PACING THRESHOLD PULSEWIDTH: 1.5 ms
MDC IDC MSMT LEADCHNL RV IMPEDANCE VALUE: 540 Ohm
MDC IDC MSMT LEADCHNL RV PACING THRESHOLD AMPLITUDE: 0.5 V
MDC IDC PG SERIAL: 7547478
MDC IDC SET LEADCHNL LV PACING PULSEWIDTH: 1.5 ms
MDC IDC SET LEADCHNL RV PACING PULSEWIDTH: 0.4 ms
Pulse Gen Model: 3242

## 2015-09-08 ENCOUNTER — Encounter: Payer: Self-pay | Admitting: Cardiology

## 2015-11-04 ENCOUNTER — Encounter: Payer: Self-pay | Admitting: Cardiovascular Disease

## 2015-11-04 ENCOUNTER — Ambulatory Visit (INDEPENDENT_AMBULATORY_CARE_PROVIDER_SITE_OTHER): Payer: Medicare Other | Admitting: Cardiovascular Disease

## 2015-11-04 VITALS — BP 132/88 | HR 116 | Ht 69.5 in | Wt 161.5 lb

## 2015-11-04 DIAGNOSIS — Z950 Presence of cardiac pacemaker: Secondary | ICD-10-CM

## 2015-11-04 DIAGNOSIS — I350 Nonrheumatic aortic (valve) stenosis: Secondary | ICD-10-CM

## 2015-11-04 DIAGNOSIS — Z7901 Long term (current) use of anticoagulants: Secondary | ICD-10-CM

## 2015-11-04 DIAGNOSIS — I429 Cardiomyopathy, unspecified: Secondary | ICD-10-CM

## 2015-11-04 DIAGNOSIS — I4821 Permanent atrial fibrillation: Secondary | ICD-10-CM

## 2015-11-04 DIAGNOSIS — I5042 Chronic combined systolic (congestive) and diastolic (congestive) heart failure: Secondary | ICD-10-CM

## 2015-11-04 DIAGNOSIS — I428 Other cardiomyopathies: Secondary | ICD-10-CM

## 2015-11-04 DIAGNOSIS — I482 Chronic atrial fibrillation: Secondary | ICD-10-CM | POA: Diagnosis not present

## 2015-11-04 NOTE — Patient Instructions (Signed)
Remote monitoring is used to monitor your Pacemaker of ICD from home. This monitoring reduces the number of office visits required to check your device to one time per year. It allows [place] to keep an eye on the functioning of your device to ensure it is working properly. You are scheduled for a device check from home on July 28th, 2017. You may send your transmission at any time that day. If you have a wireless device, the transmission will be sent automatically. After your physician reviews your transmission, you will receive a postcard with your next transmission date.  Your physician wants you to follow-up in: 12 months with Dr. Sallyanne Kuster. You will receive a reminder letter in the mail two months in advance. If you don't receive a letter, please call our office to schedule the follow-up appointment.

## 2015-11-04 NOTE — Progress Notes (Signed)
Patient ID: [name] [name], male   DOB: [birth year], 82 y.o.   MRN: [id number]    Cardiology Office Note    Date:  11/04/2015   ID:  [name] [name], DOB [birth year], MRN [id number]  PCP:  Nicoletta Dress, MD  Cardiologist:   Sanda Klein, MD   Chief Complaint  Patient presents with  . Annual Exam    no chest pain, no shortness of breath, no edema, no pain or cramping in legs, no lightheaded or dizziness    History of Present Illness:  [name] [name] is a 82 y.o. male  who presents for Follow-up of his biventricular pacemaker, nonischemic cardiomyopathy, complete heart block following AV node ablation.s/p CRT-P device upgrade (St. Jude, 2014). He also has low gradient aortic stenosis but the bulk of evidence of suggested that this was not the actual cause of his cardiomyopathy.  He feels quite well. He does not believe there has been any change in his overall stamina and breathing over the last 12 months. He denies syncope and palpitations. He has not had problems with leg edema. He has not had focal neurological events or any serious bleeding events.  Device interrogation shows normal function. He has 100% biventricular pacing and there have been no episodes of ventricular tachycardia. The device is programmed VVIR. All lead parameters look great. His thoracic impedance has been a little volatile, going up and down, returning spontaneously to normal range usually within a week. There does not appear to be good association between the episodes of decrease thoracic impedance and symptoms of congestive heart failure.  He has long-standing permanent atrial fibrillation. Attempts at maintenance of sinus rhythm failed, including complications of amiodarone-induced hepatitis. (St. Jude). Coronary angiography performed December 2014 shows no obstructive lesions. Left ventricular ejection fraction is 20-25%.  A dobutamine echocardiogram was performed in January 2015. This showed some myocardial  reserve with only mild increase in aortic valve gradients after inotropic stimulation, suggesting that the cardiomyopathy was not directly related to aortic stenosis. Nevertheless there is at least moderate aortic stenosis. At peak dobutamine dose the left ventricular ejection fraction increased from 20-25% to 25-30%. At baseline the peak and mean aortic valve gradients were 35 and 21 mm Hg respectively, at peak dobutamine dose the peak gradient was 59 mm Hg and the mean gradient was 23 mm Hg. The estimated aortic valve are increased from 0.7 cm square at baseline to 0.9 cm square at peak dobutamine dose.    Past Medical History  Diagnosis Date  . Atrial fibrillation (HCC)     echo- EF 35-40%; LA severely dilated; RA mod dilated, RV systolic pressure 0000000; LV systolic fcn mod reduced  . Atrial fibrillation (HCC)     stress test - post stress EF 28%, AFib w/ RVR and LBBB, apical and septal thinning; increased RV free wall uptake consistent w/ pressure and/or volume overload  . Aortic valvular stenoses     not symtomatic  . SSS (sick sinus syndrome) ([place])     pacemaker placed in 1993  . Diabetes mellitus without complication [facility])     Past Surgical History  Procedure Laterality Date  . Pacemaker insertion  1993  . Biv pacemaker generator change out  11/2004  . Hernia repair    . Av node ablation Bilateral 07/04/2013    Procedure: AV NODE ABLATION;  Surgeon: Evans Lance, MD;  Location: [facility] CATH LAB;  Service: Cardiovascular;  Laterality: Bilateral;  . Bi-ventricular pacemaker insertion Bilateral 07/04/2013    Procedure:  BI-VENTRICULAR PACEMAKER INSERTION (CRT-P);  Surgeon: Evans Lance, MD;  Location: [facility] CATH LAB;  Service: Cardiovascular;  Laterality: Bilateral;  . Left heart catheterization with coronary angiogram N/A 07/07/2013    Procedure: LEFT HEART CATHETERIZATION WITH CORONARY ANGIOGRAM;  Surgeon: Pixie Casino, MD;  Location: [facility] CATH LAB;  Service: Cardiovascular;   Laterality: N/A;    Current Medications: Outpatient Prescriptions Prior to Visit  Medication Sig Dispense Refill  . ACCU-CHEK SMARTVIEW test strip     . acetaminophen (TYLENOL) 325 MG tablet Take 1-2 tablets (325-650 mg total) by mouth every 4 (four) hours as needed for mild pain.    . budesonide-formoterol (SYMBICORT) 160-4.5 MCG/ACT inhaler Inhale 2 puffs into the lungs 2 (two) times daily as needed.     . carbidopa-levodopa (SINEMET IR) 25-100 MG per tablet Take 1 tablet by mouth 3 (three) times daily.    . digoxin (LANOXIN) 0.125 MG tablet Take 0.125 mg by mouth daily.    . fish oil-omega-3 fatty acids 1000 MG capsule Take 2 g by mouth daily.    . furosemide (LASIX) 40 MG tablet Take 1 tablet (40 mg total) by mouth daily. 90 tablet 3  . linagliptin (TRADJENTA) 5 MG TABS tablet Take 5 mg by mouth daily.    Marland Kitchen lisinopril (PRINIVIL,ZESTRIL) 5 MG tablet TAKE ONE TABLET BY MOUTH ONCE DAILY 90 tablet 1  . metoprolol succinate (TOPROL-XL) 50 MG 24 hr tablet Take 50 mg by mouth daily.    . mupirocin ointment (BACTROBAN) 2 % as needed.    . triamcinolone cream (KENALOG) 0.1 % Apply 1 application topically as needed. rash    . vitamin E 400 UNIT capsule Take 400 Units by mouth daily.    Marland Kitchen warfarin (COUMADIN) 5 MG tablet Take 5 mg by mouth as directed.     No facility-administered medications prior to visit.     Allergies:   Amiodarone   Social History   Social History  . Marital Status: Widowed    Spouse Name: N/A  . Number of Children: 2  . Years of Education: 12   Occupational History  . retired    Social History Main Topics  . Smoking status: Former Smoker    Quit date: 07/09/1969  . Smokeless tobacco: Never Used  . Alcohol Use: No  . Drug Use: No  . Sexual Activity: Not Asked   Other Topics Concern  . None   Social History Narrative   Widower.  Retired Hydrographic surveyor.       ROS:   Please see the history of present illness.    ROS All other systems reviewed and are  negative.   PHYSICAL EXAM:   VS:  BP 132/88 mmHg  Pulse 116  Ht 5' 9.5" (1.765 m)  Wt 73.256 kg (161 lb 8 oz)  BMI 23.52 kg/m2   GEN: Well nourished, well developed, in no acute distress HEENT: normal Neck: no JVD, carotid bruits, or masses Cardiac: Paradoxically split second heart sound, RRR; early to mid peaking 3/6 aortic ejection murmur sounds very musical, no diastolic murmurs, rubs, or gallops,no edema ; healthy left subclavian device site. Respiratory:  clear to auscultation bilaterally, normal work of breathing GI: soft, nontender, nondistended, + BS MS: no deformity or atrophy Skin: warm and dry, no rash Neuro:  Alert and Oriented x 3, Strength and sensation are intact Psych: euthymic mood, full affect  Wt Readings from Last 3 Encounters:  11/04/15 73.256 kg (161 lb 8 oz)  11/06/14 73.619 kg (  162 lb 4.8 oz)  05/05/14 75.479 kg (166 lb 6.4 oz)      Studies/Labs Reviewed:   EKG:  EKG is ordered today.  The ekg ordered today demonstrates Biventricular paced rhythm with background atrial standstill versus very fine atrial fibrillation  ASSESSMENT:    1. Chronic combined systolic and diastolic congestive heart failure ([place])   2. Non-ischemic cardiomyopathy - EF 20-25% Echo 06/30/13; Cath - no obstructive CAD 07/07/13   3. Aortic stenosis-  low gradient severe AS   4. Permanent atrial fibrillation- AVN RFA 07/05/13   5. Current use of long term anticoagulation   6. Biventricular cardiac pacemaker upgrade 07/05/13 (St Jude)      PLAN:  In order of problems listed above:  1. CHF: In spite of severely depressed left ventricular systolic function and the presence of aortic stenosis he appears to be very well compensated, NYHA functional class II, euvolemic on a relatively low dose of loop diuretic. Continue ACE inhibitor, beta blocker, digoxin and furosemide. Thoracic impedance variations do not appear to have a reliable connection to weight or symptoms 2. CMP: Etiology  of his cardiomyopathy is not entirely clear. The aortic stenosis may be contributory, but does not appear to be the cause of his cardiomyopathy, he does not have significant CAD by relatively recent coronary angiography. 3. AS: symptoms have not changed in last 12 months, suggesting that indeed the aortic stenosis is not yet critical; It is possible that he will require treatment for aortic stenosis in the near future. This should be preceded by right heart catheterization as his pacemaker leads have had a chance to mature. At this point he is not symptomatic (albeit with a very sedentary lifestyle). 4. AFib: s/p AV node ablation. Low threshold to discontinue digoxin if there is any suspicion of toxicity. Will review labs. Continue anticoagulation. CHADSVasc 5 (age 30, HF, HTN, vascular disease).  5. Warfarin: INR monitored by Dr. Delena Bali). 6. CRT-P normal device function, remote monitoring every 3 months and office visit yearly    Medication Adjustments/Labs and Tests Ordered: Current medicines are reviewed at length with the patient today.  Concerns regarding medicines are outlined above.  Medication changes, Labs and Tests ordered today are listed in the Patient Instructions below. Patient Instructions  Remote monitoring is used to monitor your Pacemaker of ICD from home. This monitoring reduces the number of office visits required to check your device to one time per year. It allows [place] to keep an eye on the functioning of your device to ensure it is working properly. You are scheduled for a device check from home on July 28th, 2017. You may send your transmission at any time that day. If you have a wireless device, the transmission will be sent automatically. After your physician reviews your transmission, you will receive a postcard with your next transmission date.  Your physician wants you to follow-up in: 12 months with Dr. Sallyanne Kuster. You will receive a reminder letter in the mail two months in  advance. If you don't receive a letter, please call our office to schedule the follow-up appointment.     Mikael Spray, MD  11/04/2015 1:25 PM    [facility] [place], [place], [place]  [address] Phone: [phone]; Fax: [phone]

## 2015-11-05 ENCOUNTER — Telehealth: Payer: Self-pay

## 2015-11-05 NOTE — Telephone Encounter (Signed)
Received lab report from [facility].  Digoxin level is elevated at 1.4 (0.5-0.9 range). Dr C recommends "reducing dose in half."  Left generic voicemail for patient to call back.

## 2015-11-22 NOTE — Telephone Encounter (Signed)
lmtcb

## 2015-11-22 NOTE — Telephone Encounter (Signed)
Please disregard previous note. Phone disconnected.  Returned call to patient. Advised him of recommendations. Patient states that he has been taking Digoxin 0.125 mg every other day for ~2-3 years.  Please advise.

## 2015-11-23 NOTE — Telephone Encounter (Signed)
lmtcb

## 2015-11-23 NOTE — Telephone Encounter (Signed)
Then I would recommend that he stops it altogether

## 2015-11-26 NOTE — Telephone Encounter (Signed)
Patient returned call. Notified him of Dr Lurline Del instructions. Patient verbalized understanding and agreed with plan.

## 2015-11-30 LAB — CUP PACEART INCLINIC DEVICE CHECK
Implantable Lead Implant Date: 20141226
Lead Channel Setting Pacing Amplitude: 2.5 V
Lead Channel Setting Sensing Sensitivity: 2 mV
MDC IDC LEAD IMPLANT DT: 20141226
MDC IDC LEAD LOCATION: 753858
MDC IDC LEAD LOCATION: 753860
MDC IDC PG SERIAL: 7547478
MDC IDC SESS DTM: 20170523110114
MDC IDC SET LEADCHNL LV PACING AMPLITUDE: 2 V
MDC IDC SET LEADCHNL LV PACING PULSEWIDTH: 1.5 ms
MDC IDC SET LEADCHNL RV PACING PULSEWIDTH: 0.4 ms
Pulse Gen Model: 3242

## 2015-12-01 ENCOUNTER — Encounter: Payer: Self-pay | Admitting: Internal Medicine

## 2016-02-07 ENCOUNTER — Ambulatory Visit (INDEPENDENT_AMBULATORY_CARE_PROVIDER_SITE_OTHER): Payer: Medicare Other | Admitting: *Deleted

## 2016-02-07 DIAGNOSIS — I5042 Chronic combined systolic (congestive) and diastolic (congestive) heart failure: Secondary | ICD-10-CM

## 2016-02-07 DIAGNOSIS — Z950 Presence of cardiac pacemaker: Secondary | ICD-10-CM

## 2016-02-07 DIAGNOSIS — I429 Cardiomyopathy, unspecified: Secondary | ICD-10-CM

## 2016-02-07 DIAGNOSIS — I428 Other cardiomyopathies: Secondary | ICD-10-CM

## 2016-02-07 NOTE — Progress Notes (Signed)
Remote pacemaker transmission.   

## 2016-02-08 ENCOUNTER — Encounter: Payer: Self-pay | Admitting: Cardiology

## 2016-02-17 LAB — CUP PACEART REMOTE DEVICE CHECK
Battery Remaining Longevity: 75 mo
Battery Remaining Percentage: 95.5 %
Battery Voltage: 2.96 V
Date Time Interrogation Session: 20170728130438
Implantable Lead Implant Date: 20141226
Implantable Lead Location: 753858
Implantable Lead Location: 753860
Lead Channel Impedance Value: 480 Ohm
Lead Channel Impedance Value: 650 Ohm
Lead Channel Pacing Threshold Amplitude: 0.75 V
Lead Channel Pacing Threshold Pulse Width: 0.4 ms
Lead Channel Pacing Threshold Pulse Width: 1.5 ms
Lead Channel Setting Pacing Amplitude: 2 V
Lead Channel Setting Pacing Pulse Width: 0.4 ms
Lead Channel Setting Sensing Sensitivity: 2 mV
MDC IDC LEAD IMPLANT DT: 20141226
MDC IDC MSMT LEADCHNL RV PACING THRESHOLD AMPLITUDE: 0.5 V
MDC IDC MSMT LEADCHNL RV SENSING INTR AMPL: 12 mV
MDC IDC SET LEADCHNL LV PACING PULSEWIDTH: 1.5 ms
MDC IDC SET LEADCHNL RV PACING AMPLITUDE: 2.5 V
Pulse Gen Model: 3242
Pulse Gen Serial Number: 7547478

## 2016-02-22 ENCOUNTER — Telehealth: Payer: Self-pay | Admitting: Cardiovascular Disease

## 2016-02-22 NOTE — Telephone Encounter (Signed)
Information relayed to [place].

## 2016-02-22 NOTE — Telephone Encounter (Signed)
[employer]:  Needs heart fail dx  EF %  To enroll pt in program

## 2016-05-08 ENCOUNTER — Ambulatory Visit (INDEPENDENT_AMBULATORY_CARE_PROVIDER_SITE_OTHER): Payer: Medicare Other | Admitting: *Deleted

## 2016-05-08 DIAGNOSIS — I482 Chronic atrial fibrillation: Secondary | ICD-10-CM

## 2016-05-08 DIAGNOSIS — I4821 Permanent atrial fibrillation: Secondary | ICD-10-CM

## 2016-05-08 NOTE — Progress Notes (Signed)
Remote pacemaker transmission.   

## 2016-05-12 ENCOUNTER — Encounter: Payer: Self-pay | Admitting: Cardiology

## 2016-06-18 LAB — CUP PACEART REMOTE DEVICE CHECK
Battery Remaining Longevity: 77 mo
Battery Voltage: 2.96 V
Date Time Interrogation Session: 20171030143640
Implantable Lead Implant Date: 20141226
Implantable Lead Location: 753860
Implantable Pulse Generator Implant Date: 20141226
Lead Channel Pacing Threshold Amplitude: 0.75 V
Lead Channel Sensing Intrinsic Amplitude: 11.3 mV
MDC IDC LEAD IMPLANT DT: 20141226
MDC IDC LEAD LOCATION: 753858
MDC IDC MSMT BATTERY REMAINING PERCENTAGE: 95.5 %
MDC IDC MSMT LEADCHNL LV IMPEDANCE VALUE: 700 Ohm
MDC IDC MSMT LEADCHNL LV PACING THRESHOLD PULSEWIDTH: 1.5 ms
MDC IDC MSMT LEADCHNL RV IMPEDANCE VALUE: 490 Ohm
MDC IDC MSMT LEADCHNL RV PACING THRESHOLD AMPLITUDE: 0.5 V
MDC IDC MSMT LEADCHNL RV PACING THRESHOLD PULSEWIDTH: 0.4 ms
MDC IDC SET LEADCHNL LV PACING AMPLITUDE: 2 V
MDC IDC SET LEADCHNL LV PACING PULSEWIDTH: 1.5 ms
MDC IDC SET LEADCHNL RV PACING AMPLITUDE: 2.5 V
MDC IDC SET LEADCHNL RV PACING PULSEWIDTH: 0.4 ms
MDC IDC SET LEADCHNL RV SENSING SENSITIVITY: 2 mV
Pulse Gen Model: 3242
Pulse Gen Serial Number: 7547478

## 2016-08-07 ENCOUNTER — Ambulatory Visit (INDEPENDENT_AMBULATORY_CARE_PROVIDER_SITE_OTHER): Payer: Medicare Other | Admitting: *Deleted

## 2016-08-07 DIAGNOSIS — I4821 Permanent atrial fibrillation: Secondary | ICD-10-CM

## 2016-08-07 DIAGNOSIS — I482 Chronic atrial fibrillation: Secondary | ICD-10-CM

## 2016-08-07 NOTE — Progress Notes (Signed)
Remote pacemaker transmission.   

## 2016-08-08 ENCOUNTER — Encounter: Payer: Self-pay | Admitting: Cardiology

## 2016-08-11 LAB — CUP PACEART REMOTE DEVICE CHECK
Date Time Interrogation Session: 20180202114706
Implantable Lead Implant Date: 20141226
Implantable Lead Location: 753858
Lead Channel Impedance Value: 450 Ohm
Lead Channel Impedance Value: 690 Ohm
Lead Channel Pacing Threshold Pulse Width: 0.4 ms
Lead Channel Sensing Intrinsic Amplitude: 9.9 mV
MDC IDC LEAD IMPLANT DT: 20141226
MDC IDC LEAD LOCATION: 753860
MDC IDC MSMT BATTERY REMAINING LONGEVITY: 67 mo
MDC IDC MSMT LEADCHNL LV PACING THRESHOLD AMPLITUDE: 0.75 V
MDC IDC MSMT LEADCHNL LV PACING THRESHOLD PULSEWIDTH: 1.5 ms
MDC IDC MSMT LEADCHNL RV PACING THRESHOLD AMPLITUDE: 0.5 V
MDC IDC PG IMPLANT DT: 20141226
Pulse Gen Serial Number: 7547478

## 2016-09-22 ENCOUNTER — Telehealth: Payer: Self-pay

## 2016-09-22 NOTE — Telephone Encounter (Signed)
Spoke with patient regarding [employer]. Patient scheduled for Dr. Loletha Grayer 6/6 - will bring new merlin monitor to pair with his device at this time. Patient verbalized understanding and had no further questions.

## 2016-12-13 ENCOUNTER — Encounter: Payer: Self-pay | Admitting: Cardiovascular Disease

## 2016-12-13 ENCOUNTER — Ambulatory Visit (INDEPENDENT_AMBULATORY_CARE_PROVIDER_SITE_OTHER): Payer: Medicare Other | Admitting: Cardiovascular Disease

## 2016-12-13 VITALS — BP 140/90 | HR 78 | Ht 69.5 in | Wt 160.0 lb

## 2016-12-13 DIAGNOSIS — I482 Chronic atrial fibrillation: Secondary | ICD-10-CM

## 2016-12-13 DIAGNOSIS — I5042 Chronic combined systolic (congestive) and diastolic (congestive) heart failure: Secondary | ICD-10-CM

## 2016-12-13 DIAGNOSIS — I428 Other cardiomyopathies: Secondary | ICD-10-CM

## 2016-12-13 DIAGNOSIS — Z950 Presence of cardiac pacemaker: Secondary | ICD-10-CM | POA: Diagnosis not present

## 2016-12-13 DIAGNOSIS — I4821 Permanent atrial fibrillation: Secondary | ICD-10-CM

## 2016-12-13 DIAGNOSIS — I350 Nonrheumatic aortic (valve) stenosis: Secondary | ICD-10-CM | POA: Diagnosis not present

## 2016-12-13 DIAGNOSIS — Z7901 Long term (current) use of anticoagulants: Secondary | ICD-10-CM

## 2016-12-13 NOTE — Patient Instructions (Signed)

## 2016-12-13 NOTE — Progress Notes (Signed)
Patient ID: [name] [name], male   DOB: 15-Jun-1927, 83 y.o.   MRN: [id number]    Cardiology Office Note    Date:  12/13/2016   ID:  [name] [name], DOB Sep 23, 1926, MRN [id number]  PCP:  [name] Dress, MD  Cardiologist:   [name] Klein, MD   Chief Complaint  Patient presents with  . Follow-up    History of Present Illness:  [name] [name] is a 83 y.o. male  who presents for Follow-up of his biventricular pacemaker, nonischemic cardiomyopathy, complete heart block following AV node ablation (2014) s/p CRT-P device upgrade (St. Jude, 2014). He also has low gradient aortic stenosis but the bulk of evidence of suggested that this was not the actual cause of his cardiomyopathy.  There has been no change in his symptoms or clinical status in the last 12 months. He continues to deny exertional dyspnea, exertional angina or syncope. He is not troubled by palpitations. He can climb a flight of stairs if he needs to, albeit slowly. He denies leg edema, claudication, focal neurological complaints, bleeding complications. He does not have orthopnea or PND.  Pacemaker interrogation shows normal function with 100% biventricular pacing. There have been no episodes of ventricular tachycardia. He is of course in permanent atrial fibrillation. His left ventricular lead is programmed to pace M3-P4 and we were able to reduce device outputs today, prolonging longevity by about a year to 7-8 years. Thoracic impedance has been stable since January 2018.  He has long-standing permanent atrial fibrillation. Attempts at maintenance of sinus rhythm failed, including complications of amiodarone-induced hepatitis. (St. Jude). Coronary angiography performed December 2014 shows no obstructive lesions. Left ventricular ejection fraction is 20-25%.  A dobutamine echocardiogram was performed in January 2015. This showed some myocardial reserve with only mild increase in aortic valve gradients after inotropic stimulation,  suggesting that the cardiomyopathy was not directly related to aortic stenosis. Nevertheless there is at least moderate aortic stenosis. At peak dobutamine dose the left ventricular ejection fraction increased from 20-25% to 25-30%. At baseline the peak and mean aortic valve gradients were 35 and 21 mm Hg respectively, at peak dobutamine dose the peak gradient was 59 mm Hg and the mean gradient was 23 mm Hg. The estimated aortic valve are increased from 0.7 cm square at baseline to 0.9 cm square at peak dobutamine dose.    Past Medical History:  Diagnosis Date  . Aortic valvular stenoses    not symtomatic  . Atrial fibrillation (HCC)    echo- EF 35-40%; LA severely dilated; RA mod dilated, RV systolic pressure [id number]; LV systolic fcn mod reduced  . Atrial fibrillation (HCC)    stress test - post stress EF 28%, AFib w/ RVR and LBBB, apical and septal thinning; increased RV free wall uptake consistent w/ pressure and/or volume overload  . Diabetes mellitus without complication ([place])   . SSS (sick sinus syndrome) ([place])    pacemaker placed in 1993    Past Surgical History:  Procedure Laterality Date  . AV NODE ABLATION Bilateral 07/04/2013   Procedure: AV NODE ABLATION;  Surgeon: Evans Lance, MD;  Location: [facility] CATH LAB;  Service: Cardiovascular;  Laterality: Bilateral;  . BI-VENTRICULAR PACEMAKER INSERTION Bilateral 07/04/2013   Procedure: BI-VENTRICULAR PACEMAKER INSERTION (CRT-P);  Surgeon: Evans Lance, MD;  Location: [facility] CATH LAB;  Service: Cardiovascular;  Laterality: Bilateral;  . BIV PACEMAKER GENERATOR CHANGE OUT  11/2004  . HERNIA REPAIR    . LEFT HEART CATHETERIZATION WITH CORONARY ANGIOGRAM  N/A 07/07/2013   Procedure: LEFT HEART CATHETERIZATION WITH CORONARY ANGIOGRAM;  Surgeon: Pixie Casino, MD;  Location: [facility] CATH LAB;  Service: Cardiovascular;  Laterality: N/A;  . PACEMAKER INSERTION  1993    Current Medications: Outpatient Medications Prior to Visit  Medication Sig  Dispense Refill  . ACCU-CHEK SMARTVIEW test strip     . acetaminophen (TYLENOL) 325 MG tablet Take 1-2 tablets (325-650 mg total) by mouth every 4 (four) hours as needed for mild pain.    . budesonide-formoterol (SYMBICORT) 160-4.5 MCG/ACT inhaler Inhale 2 puffs into the lungs 2 (two) times daily as needed.     . carbidopa-levodopa (SINEMET IR) 25-100 MG per tablet Take 1 tablet by mouth 3 (three) times daily.    . digoxin (LANOXIN) 0.125 MG tablet Take 0.125 mg by mouth daily.    . fish oil-omega-3 fatty acids 1000 MG capsule Take 2 g by mouth daily.    . furosemide (LASIX) 40 MG tablet Take 1 tablet (40 mg total) by mouth daily. 90 tablet 3  . linagliptin (TRADJENTA) 5 MG TABS tablet Take 5 mg by mouth daily.    Marland Kitchen lisinopril (PRINIVIL,ZESTRIL) 5 MG tablet TAKE ONE TABLET BY MOUTH ONCE DAILY 90 tablet 1  . metoprolol succinate (TOPROL-XL) 50 MG 24 hr tablet Take 50 mg by mouth daily.    . mupirocin ointment (BACTROBAN) 2 % as needed.    . triamcinolone cream (KENALOG) 0.1 % Apply 1 application topically as needed. rash    . vitamin E 400 UNIT capsule Take 400 Units by mouth daily.    Marland Kitchen warfarin (COUMADIN) 5 MG tablet Take 5 mg by mouth as directed.     No facility-administered medications prior to visit.      Allergies:   Amiodarone   Social History   Social History  . Marital status: Widowed    Spouse name: N/A  . Number of children: 2  . Years of education: 12   Occupational History  . retired    Social History Main Topics  . Smoking status: Former Smoker    Quit date: 07/09/1969  . Smokeless tobacco: Never Used  . Alcohol use No  . Drug use: No  . Sexual activity: Not Asked   Other Topics Concern  . None   Social History Narrative   Widower.  Retired Hydrographic surveyor.       ROS:   Please see the history of present illness.    ROS All other systems reviewed and are negative.   PHYSICAL EXAM:   VS:  BP 140/90 (BP Location: Right Arm, Patient Position: Sitting,  Cuff Size: Normal)   Pulse 78   Ht 5' 9.5" (1.765 m)   Wt 160 lb (72.6 kg)   BMI 23.29 kg/m     General: Alert, oriented x3, no distress, Well nourished, well developed Head: no evidence of trauma, PERRL, EOMI, no exophtalmos or lid lag, no myxedema, no xanthelasma; normal ears, nose and oropharynx Neck: normal jugular venous pulsations and no hepatojugular reflux; brisk carotid pulses without delay and no carotid bruits Chest: clear to auscultation, no signs of consolidation by percussion or palpation, normal fremitus, symmetrical and full respiratory excursions Cardiovascular: Paradoxically split second heart sound, RRR; early to mid peaking 3/6 aortic ejection murmur sounds very musical, no diastolic murmurs, rubs, or gallops,no edema ; healthy left subclavian device site Abdomen: no tenderness or distention, no masses by palpation, no abnormal pulsatility or arterial bruits, normal bowel sounds, no hepatosplenomegaly Extremities: no clubbing,  cyanosis or edema; 2+ radial, ulnar and brachial pulses bilaterally; 2+ right femoral, posterior tibial and dorsalis pedis pulses; 2+ left femoral, posterior tibial and dorsalis pedis pulses; no subclavian or femoral bruits Neurological: grossly nonfocal Psych: euthymic mood, full affect  Wt Readings from Last 3 Encounters:  12/13/16 160 lb (72.6 kg)  11/04/15 161 lb 8 oz (73.3 kg)  11/06/14 162 lb 4.8 oz (73.6 kg)      Studies/Labs Reviewed:   EKG:  EKG is ordered today.  It shows very fine background atrial fibrillation and 100% ventricular pacing. There are very small initial r waves in leads V1 and V2  ASSESSMENT:    1. Chronic combined systolic and diastolic heart failure ([place])   2. Non-ischemic cardiomyopathy - EF 20-25% , Cath - no obstructive CAD 2014   3. Nonrheumatic aortic valve stenosis   4. Permanent atrial fibrillation- AVN RFA 07/05/13   5. Current use of long term anticoagulation   6. Biventricular cardiac pacemaker in situ       PLAN:  In order of problems listed above:  1. CHF: He continues have good functional status (NYHA class II) despite severely depressed left ventricular systolic function and at least moderate stenosis. He is maintaining euvolemic on a very low dose of diuretic and has not needed furosemide dose escalation. Continue ACE inhibitor, beta blocker, digoxin. Thoracic impedance has been stable, but in the past did not really appear to correlate well with his symptoms or his fluid weight. 2. CMP: He has not been shown to have significant coronary artery disease and his dobutamine echo suggested that he had cardiomyopathy for reasons other than aortic stenosis.Marland Kitchen 3. AS: There is still no compelling reason to believe he has symptoms related to aortic stenosis. Asked him to call if he develops exertional angina, exertional dyspnea or exertional syncope. More than 3 years have passed since we performed his dobutamine echo, without change in clinical status, and this seems to confirm that aortic stenosis was not the major driver for his cardiomyopathy 4. AFib: s/p AV node ablation in 2014. He does not need digoxin for rate control and this medication has been prescribed for heart failure. He is well compensated and there is a low threshold to discontinue it if there is any sign of toxicity. On anticoagulation. CHADSVasc 5 (age 16, HF, HTN, vascular disease).  5. Warfarin: INR is monitored by his PCP. No bleeding problems 6. CRT-P normal device function, remote monitoring every 3 months and office visit yearly    Medication Adjustments/Labs and Tests Ordered: Current medicines are reviewed at length with the patient today.  Concerns regarding medicines are outlined above.  Medication changes, Labs and Tests ordered today are listed in the Patient Instructions below. Patient Instructions  Dr Sallyanne Kuster recommends that you continue on your current medications as directed. Please refer to the Current Medication  list given to you today.  Remote monitoring is used to monitor your Pacemaker or ICD from home. This monitoring reduces the number of office visits required to check your device to one time per year. It allows [place] to keep an eye on the functioning of your device to ensure it is working properly. You are scheduled for a device check from home on Wednesday, September 5th, 2018. You may send your transmission at any time that day. If you have a wireless device, the transmission will be sent automatically. After your physician reviews your transmission, you will receive a notification with your next transmission date.  Dr  Kaelen Brennan recommends that you schedule a follow-up appointment in 12 months with a pacemaker check. You will receive a reminder letter in the mail two months in advance. If you don't receive a letter, please call our office to schedule the follow-up appointment.  If you need a refill on your cardiac medications before your next appointment, please call your pharmacy.    Signed, [name] Klein, MD  12/13/2016 1:02 PM    [facility] [place], [place], [place]  [address] Phone: [phone]; Fax: [phone]

## 2016-12-19 ENCOUNTER — Other Ambulatory Visit: Payer: Self-pay | Admitting: Cardiovascular Disease

## 2016-12-21 ENCOUNTER — Telehealth: Payer: Self-pay | Admitting: *Deleted

## 2016-12-21 LAB — CUP PACEART INCLINIC DEVICE CHECK
Date Time Interrogation Session: 20180614113757
Implantable Lead Location: 753858
MDC IDC LEAD IMPLANT DT: 20141226
MDC IDC LEAD IMPLANT DT: 20141226
MDC IDC LEAD LOCATION: 753860
MDC IDC PG IMPLANT DT: 20141226
Pulse Gen Model: 3242
Pulse Gen Serial Number: 7547478

## 2016-12-21 NOTE — Telephone Encounter (Signed)
LMTCB/sss  Ordered patient new Merlin monitor. Patient needs to call once it's received, so that an appt can be scheduled for pairing.

## 2016-12-22 NOTE — Telephone Encounter (Signed)
Patient called back and stated that he has received his new home monitor. When offered to make him an appt so we could help him set it up pt stated that he will call back later when he is ready for the appointment.

## 2016-12-25 NOTE — Telephone Encounter (Signed)
Pt called back and agreed to an appt on Monday 01-01-17 at 10:30 AM. He couldn't come any other day due to transpiration issues.

## 2017-01-01 ENCOUNTER — Ambulatory Visit (INDEPENDENT_AMBULATORY_CARE_PROVIDER_SITE_OTHER): Payer: Self-pay | Admitting: *Deleted

## 2017-01-01 DIAGNOSIS — I428 Other cardiomyopathies: Secondary | ICD-10-CM

## 2017-01-01 NOTE — Progress Notes (Signed)
Home monitor paired in office successfully.

## 2017-03-14 ENCOUNTER — Ambulatory Visit (INDEPENDENT_AMBULATORY_CARE_PROVIDER_SITE_OTHER): Payer: Medicare Other | Admitting: *Deleted

## 2017-03-14 DIAGNOSIS — I428 Other cardiomyopathies: Secondary | ICD-10-CM | POA: Diagnosis not present

## 2017-03-15 NOTE — Progress Notes (Signed)
Remote pacemaker transmission.   

## 2017-03-20 ENCOUNTER — Encounter: Payer: Self-pay | Admitting: Cardiology

## 2017-04-04 LAB — CUP PACEART REMOTE DEVICE CHECK
Implantable Lead Implant Date: 20141226
Implantable Lead Location: 753858
Implantable Pulse Generator Implant Date: 20141226
Lead Channel Impedance Value: 440 Ohm
Lead Channel Pacing Threshold Amplitude: 0.75 V
Lead Channel Pacing Threshold Pulse Width: 0.4 ms
Lead Channel Sensing Intrinsic Amplitude: 12 mV
Lead Channel Setting Pacing Amplitude: 2.5 V
Lead Channel Setting Pacing Amplitude: 2.5 V
Lead Channel Setting Pacing Pulse Width: 0.4 ms
Lead Channel Setting Pacing Pulse Width: 0.5 ms
MDC IDC LEAD IMPLANT DT: 20141226
MDC IDC LEAD LOCATION: 753860
MDC IDC MSMT BATTERY REMAINING LONGEVITY: 88 mo
MDC IDC MSMT BATTERY REMAINING PERCENTAGE: 95.5 %
MDC IDC MSMT BATTERY VOLTAGE: 2.96 V
MDC IDC MSMT LEADCHNL LV IMPEDANCE VALUE: 650 Ohm
MDC IDC MSMT LEADCHNL LV PACING THRESHOLD AMPLITUDE: 1 V
MDC IDC MSMT LEADCHNL LV PACING THRESHOLD PULSEWIDTH: 0.5 ms
MDC IDC SESS DTM: 20180905132053
MDC IDC SET LEADCHNL RV SENSING SENSITIVITY: 4 mV
Pulse Gen Model: 3242
Pulse Gen Serial Number: 7547478

## 2017-06-13 ENCOUNTER — Telehealth: Payer: Self-pay | Admitting: Cardiology

## 2017-06-13 ENCOUNTER — Ambulatory Visit (INDEPENDENT_AMBULATORY_CARE_PROVIDER_SITE_OTHER): Payer: Medicare Other | Admitting: *Deleted

## 2017-06-13 DIAGNOSIS — I428 Other cardiomyopathies: Secondary | ICD-10-CM | POA: Diagnosis not present

## 2017-06-13 NOTE — Telephone Encounter (Signed)
LMOVM reminding pt to send remote transmission.   

## 2017-06-14 ENCOUNTER — Encounter: Payer: Self-pay | Admitting: Cardiology

## 2017-06-14 NOTE — Progress Notes (Signed)
Remote pacemaker transmission.   

## 2017-06-19 LAB — CUP PACEART REMOTE DEVICE CHECK
Battery Remaining Longevity: 88 mo
Date Time Interrogation Session: 20181205202817
Implantable Lead Implant Date: 20141226
Implantable Lead Location: 753858
Implantable Pulse Generator Implant Date: 20141226
Lead Channel Pacing Threshold Amplitude: 0.75 V
Lead Channel Pacing Threshold Pulse Width: 0.4 ms
Lead Channel Sensing Intrinsic Amplitude: 10 mV
Lead Channel Setting Pacing Amplitude: 2.5 V
Lead Channel Setting Pacing Amplitude: 2.5 V
Lead Channel Setting Pacing Pulse Width: 0.5 ms
MDC IDC LEAD IMPLANT DT: 20141226
MDC IDC LEAD LOCATION: 753860
MDC IDC MSMT BATTERY REMAINING PERCENTAGE: 95.5 %
MDC IDC MSMT BATTERY VOLTAGE: 2.96 V
MDC IDC MSMT LEADCHNL LV IMPEDANCE VALUE: 690 Ohm
MDC IDC MSMT LEADCHNL LV PACING THRESHOLD AMPLITUDE: 1 V
MDC IDC MSMT LEADCHNL LV PACING THRESHOLD PULSEWIDTH: 0.5 ms
MDC IDC MSMT LEADCHNL RV IMPEDANCE VALUE: 430 Ohm
MDC IDC PG SERIAL: 7547478
MDC IDC SET LEADCHNL RV PACING PULSEWIDTH: 0.4 ms
MDC IDC SET LEADCHNL RV SENSING SENSITIVITY: 4 mV

## 2017-09-12 ENCOUNTER — Ambulatory Visit (INDEPENDENT_AMBULATORY_CARE_PROVIDER_SITE_OTHER): Payer: Medicare Other | Admitting: *Deleted

## 2017-09-12 DIAGNOSIS — I428 Other cardiomyopathies: Secondary | ICD-10-CM

## 2017-09-12 NOTE — Progress Notes (Signed)
Remote pacemaker transmission.   

## 2017-09-13 ENCOUNTER — Encounter: Payer: Self-pay | Admitting: Cardiology

## 2017-09-18 LAB — CUP PACEART REMOTE DEVICE CHECK
Date Time Interrogation Session: 20190312203816
Implantable Lead Implant Date: 20141226
Implantable Lead Location: 753858
MDC IDC LEAD IMPLANT DT: 20141226
MDC IDC LEAD LOCATION: 753860
MDC IDC PG IMPLANT DT: 20141226
Pulse Gen Model: 3242
Pulse Gen Serial Number: 7547478

## 2017-12-12 ENCOUNTER — Ambulatory Visit (INDEPENDENT_AMBULATORY_CARE_PROVIDER_SITE_OTHER): Payer: Medicare Other | Admitting: *Deleted

## 2017-12-12 DIAGNOSIS — I482 Chronic atrial fibrillation: Secondary | ICD-10-CM

## 2017-12-12 DIAGNOSIS — I4821 Permanent atrial fibrillation: Secondary | ICD-10-CM

## 2017-12-12 NOTE — Progress Notes (Signed)
Remote pacemaker transmission.   

## 2018-01-16 ENCOUNTER — Encounter: Payer: Medicare Other | Admitting: Cardiovascular Disease

## 2018-01-23 ENCOUNTER — Encounter: Payer: Self-pay | Admitting: Cardiovascular Disease

## 2018-01-23 ENCOUNTER — Ambulatory Visit: Payer: Medicare Other | Admitting: Cardiovascular Disease

## 2018-01-23 VITALS — BP 138/86 | HR 83 | Ht 69.0 in | Wt 155.0 lb

## 2018-01-23 DIAGNOSIS — I482 Chronic atrial fibrillation: Secondary | ICD-10-CM

## 2018-01-23 DIAGNOSIS — Z7901 Long term (current) use of anticoagulants: Secondary | ICD-10-CM | POA: Diagnosis not present

## 2018-01-23 DIAGNOSIS — I4821 Permanent atrial fibrillation: Secondary | ICD-10-CM

## 2018-01-23 DIAGNOSIS — I5042 Chronic combined systolic (congestive) and diastolic (congestive) heart failure: Secondary | ICD-10-CM | POA: Diagnosis not present

## 2018-01-23 DIAGNOSIS — I428 Other cardiomyopathies: Secondary | ICD-10-CM | POA: Diagnosis not present

## 2018-01-23 DIAGNOSIS — Z950 Presence of cardiac pacemaker: Secondary | ICD-10-CM | POA: Diagnosis not present

## 2018-01-23 DIAGNOSIS — I350 Nonrheumatic aortic (valve) stenosis: Secondary | ICD-10-CM

## 2018-01-23 LAB — CUP PACEART INCLINIC DEVICE CHECK
Battery Voltage: 2.95 V
Brady Statistic RA Percent Paced: 0 %
Date Time Interrogation Session: 20190717140121
Implantable Lead Implant Date: 20141226
Implantable Lead Location: 753860
Lead Channel Impedance Value: 600 Ohm
Lead Channel Pacing Threshold Amplitude: 0.75 V
Lead Channel Pacing Threshold Amplitude: 1 V
Lead Channel Setting Pacing Amplitude: 2.5 V
Lead Channel Setting Pacing Pulse Width: 0.4 ms
Lead Channel Setting Sensing Sensitivity: 4 mV
MDC IDC LEAD IMPLANT DT: 20141226
MDC IDC LEAD LOCATION: 753858
MDC IDC MSMT BATTERY REMAINING LONGEVITY: 74 mo
MDC IDC MSMT LEADCHNL LV PACING THRESHOLD AMPLITUDE: 1 V
MDC IDC MSMT LEADCHNL LV PACING THRESHOLD PULSEWIDTH: 0.5 ms
MDC IDC MSMT LEADCHNL LV PACING THRESHOLD PULSEWIDTH: 0.5 ms
MDC IDC MSMT LEADCHNL RV IMPEDANCE VALUE: 400 Ohm
MDC IDC MSMT LEADCHNL RV PACING THRESHOLD AMPLITUDE: 0.75 V
MDC IDC MSMT LEADCHNL RV PACING THRESHOLD PULSEWIDTH: 0.4 ms
MDC IDC MSMT LEADCHNL RV PACING THRESHOLD PULSEWIDTH: 0.4 ms
MDC IDC PG IMPLANT DT: 20141226
MDC IDC SET LEADCHNL LV PACING AMPLITUDE: 2.5 V
MDC IDC SET LEADCHNL LV PACING PULSEWIDTH: 0.5 ms
MDC IDC STAT BRADY RV PERCENT PACED: 98 %
Pulse Gen Model: 3242
Pulse Gen Serial Number: 7547478

## 2018-01-23 NOTE — Progress Notes (Signed)
Patient ID: NAOD SWEETLAND, male   DOB: Jul 03, 1927, 84 y.o.   MRN: [id number] Patient ID: TREVIAN HAYASHIDA, male   DOB: Jul 02, 1927, 84 y.o.   MRN: [id number]    Cardiology Office Note    Date:  01/23/2018   ID:  [name], DOB 05/18/1927, MRN [id number]  PCP:  Nicoletta Dress, MD  Cardiologist:   Sanda Klein, MD   Chief Complaint  Patient presents with  . Follow-up    12 months    History of Present Illness:  LAURENCE CROFFORD is a 84 y.o. male  who presents for Follow-up of his biventricular pacemaker, nonischemic cardiomyopathy, complete heart block following AV node ablation (2014) s/p CRT-P device upgrade (St. Jude, 2014). He also has low gradient aortic stenosis.  Is only been fairly subtle changes in his functional status.  He becomes dizzy if he "walks too much".  Otherwise he continues to have functional class II exertional dyspnea and does not have angina.  He can ride his lawnmower for an hour and use the weedeater for 30 minutes without feeling poorly.  However if he has to walk fast or up an incline he often becomes dizzy rather than short of breath.  His blood pressure today is towards the higher end of normal.  He does not have orthostatic dizziness.  He has not experienced full-blown syncope.  Denies edema, bleeding complications, focal neurological complaints, intermittent claudication.  Warfarin anticoagulation is followed by his primary care provider in [place].  Pacemaker interrogation shows normal function with 100% biventricular pacing. There have been no episodes of ventricular tachycardia. He is of course in permanent atrial fibrillation. His left ventricular lead is programmed to pace M3-P4.  Current estimated battery longevity 6.2-7.1 years.  He has 96% biventricular pacing and there are no significant episodes of high ventricular rate. Thoracic impedance has been stable.  Heart rate histograms appear appropriate and there is a nice separation between average daytime  and nighttime heart rates.  He has long-standing permanent atrial fibrillation. Attempts at maintenance of sinus rhythm failed, including complications of amiodarone-induced hepatitis. (St. Jude). Coronary angiography performed December 2014 shows no obstructive lesions. Left ventricular ejection fraction is 20-25%.  A dobutamine echocardiogram was performed in January 2015. This showed some myocardial reserve with only mild increase in aortic valve gradients after inotropic stimulation, suggesting that the cardiomyopathy was not directly related to aortic stenosis. Nevertheless there is at least moderate aortic stenosis. At peak dobutamine dose the left ventricular ejection fraction increased from 20-25% to 25-30%. At baseline the peak and mean aortic valve gradients were 35 and 21 mm Hg respectively, at peak dobutamine dose the peak gradient was 59 mm Hg and the mean gradient was 23 mm Hg. The estimated aortic valve are increased from 0.7 cm square at baseline to 0.9 cm square at peak dobutamine dose.  Past Medical History:  Diagnosis Date  . Aortic valvular stenoses    not symtomatic  . Atrial fibrillation (HCC)    echo- EF 35-40%; LA severely dilated; RA mod dilated, RV systolic pressure [id number]; LV systolic fcn mod reduced  . Atrial fibrillation (HCC)    stress test - post stress EF 28%, AFib w/ RVR and LBBB, apical and septal thinning; increased RV free wall uptake consistent w/ pressure and/or volume overload  . Diabetes mellitus without complication ([place])   . SSS (sick sinus syndrome) ([place])    pacemaker placed in 1993    Past Surgical History:  Procedure Laterality Date  .  AV NODE ABLATION Bilateral 07/04/2013   Procedure: AV NODE ABLATION;  Surgeon: Evans Lance, MD;  Location: [facility] CATH LAB;  Service: Cardiovascular;  Laterality: Bilateral;  . BI-VENTRICULAR PACEMAKER INSERTION Bilateral 07/04/2013   Procedure: BI-VENTRICULAR PACEMAKER INSERTION (CRT-P);  Surgeon: Evans Lance, MD;   Location: [facility] C[facility] LAB;  Service: Cardiovascular;  Laterality: Bilateral;  . BIV PACEMAKER GENERATOR CHANGE OUT  11/2004  . HERNIA REPAIR    . LEFT HEART CATHETERIZATION WITH CORONARY ANGIOGRAM N/A 07/07/2013   Procedure: LEFT HEART CATHETERIZATION WITH CORONARY ANGIOGRAM;  Surgeon: Pixie Casino, MD;  Location: [facility] CATH LAB;  Service: Cardiovascular;  Laterality: N/A;  . PACEMAKER INSERTION  1993    Current Medications: Outpatient Medications Prior to Visit  Medication Sig Dispense Refill  . ACCU-CHEK SMARTVIEW test strip     . acetaminophen (TYLENOL) 325 MG tablet Take 1-2 tablets (325-650 mg total) by mouth every 4 (four) hours as needed for mild pain.    . budesonide-formoterol (SYMBICORT) 160-4.5 MCG/ACT inhaler Inhale 2 puffs into the lungs 2 (two) times daily as needed.     . carbidopa-levodopa (SINEMET IR) 25-100 MG per tablet Take 1 tablet by mouth 3 (three) times daily.    . fish oil-omega-3 fatty acids 1000 MG capsule Take 2 g by mouth daily.    . furosemide (LASIX) 40 MG tablet Take 1 tablet (40 mg total) by mouth daily. 90 tablet 3  . linagliptin (TRADJENTA) 5 MG TABS tablet Take 5 mg by mouth daily.    Marland Kitchen lisinopril (PRINIVIL,ZESTRIL) 5 MG tablet TAKE ONE TABLET BY MOUTH ONCE DAILY 90 tablet 1  . metoprolol succinate (TOPROL-XL) 25 MG 24 hr tablet Take 25 mg by mouth daily.    . Multiple Vitamins-Minerals (PRESERVISION AREDS 2) CAPS Take 2 capsules by mouth daily.    . mupirocin ointment (BACTROBAN) 2 % as needed.    . triamcinolone cream (KENALOG) 0.1 % Apply 1 application topically as needed. rash    . vitamin E 400 UNIT capsule Take 400 Units by mouth daily.    Marland Kitchen warfarin (COUMADIN) 5 MG tablet Take 5 mg by mouth as directed.    . digoxin (LANOXIN) 0.125 MG tablet Take 0.125 mg by mouth daily.    . metoprolol succinate (TOPROL-XL) 50 MG 24 hr tablet Take 50 mg by mouth daily.     No facility-administered medications prior to visit.      Allergies:   Amiodarone    Social History   Socioeconomic History  . Marital status: Widowed    Spouse name: Not on file  . Number of children: 2  . Years of education: 49  . Highest education level: Not on file  Occupational History  . Occupation: retired  Scientific laboratory technician  . Financial resource strain: Not on file  . Food insecurity:    Worry: Not on file    Inability: Not on file  . Transportation needs:    Medical: Not on file    Non-medical: Not on file  Tobacco Use  . Smoking status: Former Smoker    Last attempt to quit: 07/09/1969    Years since quitting: 48.5  . Smokeless tobacco: Never Used  Substance and Sexual Activity  . Alcohol use: No  . Drug use: No  . Sexual activity: Not on file  Lifestyle  . Physical activity:    Days per week: Not on file    Minutes per session: Not on file  . Stress: Not on file  Relationships  .  Social connections:    Talks on phone: Not on file    Gets together: Not on file    Attends religious service: Not on file    Active member of club or organization: Not on file    Attends meetings of clubs or organizations: Not on file    Relationship status: Not on file  Other Topics Concern  . Not on file  Social History Narrative   Widower.  Retired Hydrographic surveyor.       ROS:   Please see the history of present illness.    ROS All other systems reviewed and are negative.   PHYSICAL EXAM:   VS:  BP 138/86 (BP Location: Right Arm, Patient Position: Sitting, Cuff Size: Normal)   Pulse 83   Ht 5\' 9"  (1.753 m)   Wt 155 lb (70.3 kg)   BMI 22.89 kg/m      General: Alert, oriented x3, no distress, very lean, does not appear frail Head: no evidence of trauma, PERRL, EOMI, no exophtalmos or lid lag, no myxedema, no xanthelasma; normal ears, nose and oropharynx Neck: normal jugular venous pulsations and no hepatojugular reflux; brisk carotid pulses without delay and no carotid bruits Chest: clear to auscultation, no signs of consolidation by percussion or  palpation, normal fremitus, symmetrical and full respiratory excursions Cardiovascular: normal position and quality of the apical impulse, regular rhythm, normal first and paradoxically split second heart sounds, mid peaking musical 3/6 systolic ejection murmur in the aortic focus radiating towards the neck, no diastolic murmurs, rubs or gallops Abdomen: no tenderness or distention, no masses by palpation, no abnormal pulsatility or arterial bruits, normal bowel sounds, no hepatosplenomegaly Extremities: no clubbing, cyanosis or edema; 2+ radial, ulnar and brachial pulses bilaterally; 2+ right femoral, posterior tibial and dorsalis pedis pulses; 2+ left femoral, posterior tibial and dorsalis pedis pulses; no subclavian or femoral bruits Neurological: grossly nonfocal Psych: Normal mood and affect   Wt Readings from Last 3 Encounters:  01/23/18 155 lb (70.3 kg)  12/13/16 160 lb (72.6 kg)  11/04/15 161 lb 8 oz (73.3 kg)    Studies/Labs Reviewed:   EKG:  EKG is ordered today.  She has background atrial fibrillation and ventricular paced rhythm with small R waves in leads V1 and V2.  The QRS complex duration is 150 ms, QTC 535 ms.  ASSESSMENT:    1. Chronic combined systolic and diastolic heart failure ([place])   2. Non-ischemic cardiomyopathy ([place])   3. Nonrheumatic aortic valve stenosis   4. Permanent atrial fibrillation- AVN RFA 07/05/13   5. Long term (current) use of anticoagulants   6. Biventricular cardiac pacemaker in situ      PLAN:  In order of problems listed above:  1. CHF: He has not required any adjustments in his diuretic dose and appears clinically euvolemic, NYHA functional class II. Continue ACE inhibitor, beta blocker. Thoracic impedance has been stable, but in the past did not really appear to correlate well with his symptoms or his fluid weight. 2. CMP: The cause of his cardiomyopathy is uncertain.  He does not have significant coronary artery disease.  His aortic  stenosis did not appear to be severe enough to cause cardiomyopathy.  This assessment has been proven by the fact that roughly 4 years have passed and he has not had escalation of symptoms of aortic stenosis until now. 3. AS: I am concerned that his symptoms of exertional dizziness might be a marker of progression of aortic stenosis severity.  We  will schedule him for an outpatient echocardiogram. 4. AFib: s/p AV node ablation in 2014. On anticoagulation. CHADSVasc 5 (age 51, HF, HTN, vascular disease).  No embolic events. 5. Warfarin: Monitored in [place], no bleeding complications 6. CRT-P normal device function, 96% biventricular pacing rate, remote monitoring every 3 months and office visit yearly    Medication Adjustments/Labs and Tests Ordered: Current medicines are reviewed at length with the patient today.  Concerns regarding medicines are outlined above.  Medication changes, Labs and Tests ordered today are listed in the Patient Instructions below. Patient Instructions  Dr Sallyanne Kuster recommends that you continue on your current medications as directed. Please refer to the Current Medication list given to you today.  Your physician has requested that you have an echocardiogram. Echocardiography is a painless test that uses sound waves to create images of your heart. It provides your doctor with information about the size and shape of your heart and how well your heart's chambers and valves are working. This procedure takes approximately one hour. There are no restrictions for this procedure. >>This will be performed at our [facility] location [place] [address] [phone]  Remote monitoring is used to monitor your Pacemaker or ICD from home. This monitoring reduces the number of office visits required to check your device to one time per year. It allows [place] to keep an eye on the functioning of your device to ensure it is working properly. You are scheduled for a device check  from home on Wednesday, September 9th, 2019. You may send your transmission at any time that day. If you have a wireless device, the transmission will be sent automatically. After your physician reviews your transmission, you will receive a notification with your next transmission date.  To improve our patient care and to more adequately follow your device, [facility] has decided, as a practice, to start following each patient four times a year with your home monitor. This means that you may experience a remote appointment that is close to an in-office appointment with your physician. Your insurance will apply at the same rate as other remote monitoring transmissions.  Dr Sallyanne Kuster recommends that you schedule a follow-up appointment in 12 months with a pacemaker check. You will receive a reminder letter in the mail two months in advance. If you don't receive a letter, please call our office to schedule the follow-up appointment.  If you need a refill on your cardiac medications before your next appointment, please call your pharmacy.    Signed, Sanda Klein, MD  01/23/2018 5:25 PM    [facility] [place], [place], [place]  [address] Phone: [phone]; Fax: [phone]

## 2018-01-23 NOTE — Patient Instructions (Signed)
Dr Sallyanne Kuster recommends that you continue on your current medications as directed. Please refer to the Current Medication list given to you today.  Your physician has requested that you have an echocardiogram. Echocardiography is a painless test that uses sound waves to create images of your heart. It provides your doctor with information about the size and shape of your heart and how well your heart's chambers and valves are working. This procedure takes approximately one hour. There are no restrictions for this procedure. >>This will be performed at our [facility] location [place] [address] [phone]  Remote monitoring is used to monitor your Pacemaker or ICD from home. This monitoring reduces the number of office visits required to check your device to one time per year. It allows [place] to keep an eye on the functioning of your device to ensure it is working properly. You are scheduled for a device check from home on Wednesday, September 9th, 2019. You may send your transmission at any time that day. If you have a wireless device, the transmission will be sent automatically. After your physician reviews your transmission, you will receive a notification with your next transmission date.  To improve our patient care and to more adequately follow your device, [facility] has decided, as a practice, to start following each patient four times a year with your home monitor. This means that you may experience a remote appointment that is close to an in-office appointment with your physician. Your insurance will apply at the same rate as other remote monitoring transmissions.  Dr Sallyanne Kuster recommends that you schedule a follow-up appointment in 12 months with a pacemaker check. You will receive a reminder letter in the mail two months in advance. If you don't receive a letter, please call our office to schedule the follow-up appointment.  If you need a refill on your cardiac medications  before your next appointment, please call your pharmacy.

## 2018-01-31 LAB — CUP PACEART REMOTE DEVICE CHECK
Battery Remaining Longevity: 81 mo
Battery Remaining Percentage: 89 %
Date Time Interrogation Session: 20190605140829
Implantable Lead Implant Date: 20141226
Implantable Lead Location: 753860
Lead Channel Impedance Value: 650 Ohm
Lead Channel Pacing Threshold Amplitude: 0.75 V
Lead Channel Pacing Threshold Amplitude: 1 V
Lead Channel Sensing Intrinsic Amplitude: 12 mV
Lead Channel Setting Pacing Pulse Width: 0.4 ms
MDC IDC LEAD IMPLANT DT: 20141226
MDC IDC LEAD LOCATION: 753858
MDC IDC MSMT BATTERY VOLTAGE: 2.95 V
MDC IDC MSMT LEADCHNL LV PACING THRESHOLD PULSEWIDTH: 0.5 ms
MDC IDC MSMT LEADCHNL RV IMPEDANCE VALUE: 410 Ohm
MDC IDC MSMT LEADCHNL RV PACING THRESHOLD PULSEWIDTH: 0.4 ms
MDC IDC PG IMPLANT DT: 20141226
MDC IDC SET LEADCHNL LV PACING AMPLITUDE: 2.5 V
MDC IDC SET LEADCHNL LV PACING PULSEWIDTH: 0.5 ms
MDC IDC SET LEADCHNL RV PACING AMPLITUDE: 2.5 V
MDC IDC SET LEADCHNL RV SENSING SENSITIVITY: 4 mV
Pulse Gen Model: 3242
Pulse Gen Serial Number: 7547478

## 2018-02-25 ENCOUNTER — Encounter: Payer: Self-pay | Admitting: Cardiology

## 2018-02-25 ENCOUNTER — Ambulatory Visit (INDEPENDENT_AMBULATORY_CARE_PROVIDER_SITE_OTHER): Payer: Medicare Other | Admitting: Cardiology

## 2018-02-25 ENCOUNTER — Other Ambulatory Visit: Payer: Self-pay

## 2018-02-25 ENCOUNTER — Ambulatory Visit (INDEPENDENT_AMBULATORY_CARE_PROVIDER_SITE_OTHER): Payer: Medicare Other

## 2018-02-25 VITALS — BP 124/62 | HR 71 | Resp 10

## 2018-02-25 DIAGNOSIS — I482 Chronic atrial fibrillation: Secondary | ICD-10-CM | POA: Diagnosis not present

## 2018-02-25 DIAGNOSIS — E119 Type 2 diabetes mellitus without complications: Secondary | ICD-10-CM

## 2018-02-25 DIAGNOSIS — I420 Dilated cardiomyopathy: Secondary | ICD-10-CM

## 2018-02-25 DIAGNOSIS — I428 Other cardiomyopathies: Secondary | ICD-10-CM

## 2018-02-25 DIAGNOSIS — I350 Nonrheumatic aortic (valve) stenosis: Secondary | ICD-10-CM | POA: Diagnosis not present

## 2018-02-25 DIAGNOSIS — I5042 Chronic combined systolic (congestive) and diastolic (congestive) heart failure: Secondary | ICD-10-CM

## 2018-02-25 DIAGNOSIS — I4821 Permanent atrial fibrillation: Secondary | ICD-10-CM

## 2018-02-25 MED ORDER — LISINOPRIL 5 MG PO TABS: 2.5000 mg | ORAL_TABLET | Freq: Every day | ORAL | 1 refills | Status: DC

## 2018-02-25 MED ORDER — FUROSEMIDE 40 MG PO TABS: 20.0000 mg | ORAL_TABLET | Freq: Every day | ORAL | 2 refills | Status: DC

## 2018-02-25 MED ORDER — ASPIRIN EC 81 MG PO TBEC: 81.0000 mg | DELAYED_RELEASE_TABLET | Freq: Every day | ORAL | 3 refills | Status: DC

## 2018-02-25 NOTE — Progress Notes (Signed)
Cardiology Office Note:    Date:  02/25/2018   ID:  [name] [name], DOB July 29, 1926, MRN [id number]  PCP:  Nicoletta Dress, MD  Cardiologist:  Jenne Campus, MD    Referring MD: Nicoletta Dress, MD   Chief Complaint  Patient presents with  . Follow up on Echo  I have echocardiogram done today  History of Present Illness:    [name] [name] is a 84 y.o. 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  Also history of low gradient significant aortic stenosis he came to our office to have an echocardiogram done I was called by my technician to look at the echocardiogram since patient had critical aortic stenosis.  Eventually at the end up talking to him in the room he described to me exertional shortness of breath as well as dizziness to the point of passing out when he does some exercises that is been going on for last few months at least.  Denies have any chest pain tightness squeezing pressure burning chest.  Past Medical History:  Diagnosis Date  . Aortic valvular stenoses    not symtomatic  . Atrial fibrillation (HCC)    echo- EF 35-40%; LA severely dilated; RA mod dilated, RV systolic pressure 35TIRW; LV systolic fcn mod reduced  . Atrial fibrillation (HCC)    stress test - post stress EF 28%, AFib w/ RVR and LBBB, apical and septal thinning; increased RV free wall uptake consistent w/ pressure and/or volume overload  . Diabetes mellitus without complication ([place])   . SSS (sick sinus syndrome) ([place])    pacemaker placed in 1993    Past Surgical History:  Procedure Laterality Date  . AV NODE ABLATION Bilateral 07/04/2013   Procedure: AV NODE ABLATION;  Surgeon: Evans Lance, MD;  Location: [facility] CATH LAB;  Service: Cardiovascular;  Laterality: Bilateral;  . BI-VENTRICULAR PACEMAKER INSERTION Bilateral 07/04/2013   Procedure: BI-VENTRICULAR PACEMAKER INSERTION  (CRT-P);  Surgeon: Evans Lance, MD;  Location: [facility] CATH LAB;  Service: Cardiovascular;  Laterality: Bilateral;  . BIV PACEMAKER GENERATOR CHANGE OUT  11/2004  . HERNIA REPAIR    . LEFT HEART CATHETERIZATION WITH CORONARY ANGIOGRAM N/A 07/07/2013   Procedure: LEFT HEART CATHETERIZATION WITH CORONARY ANGIOGRAM;  Surgeon: Pixie Casino, MD;  Location: [facility] CATH LAB;  Service: Cardiovascular;  Laterality: N/A;  . PACEMAKER INSERTION  1993    Current Medications: No outpatient medications have been marked as taking for the 02/25/18 encounter (Office Visit) with Park Liter, MD.     Allergies:   Amiodarone   Social History   Socioeconomic History  . Marital status: Widowed    Spouse name: Not on file  . Number of children: 2  . Years of education: 20  . Highest education level: Not on file  Occupational History  . Occupation: retired  Scientific laboratory technician  . Financial resource strain: Not on file  . Food insecurity:    Worry: Not on file    Inability: Not on file  . Transportation needs:    Medical: Not on file    Non-medical: Not on file  Tobacco Use  . Smoking status: Former Smoker    Last attempt to quit: 07/09/1969    Years since quitting: 48.6  . Smokeless tobacco: Never Used  Substance and Sexual Activity  . Alcohol use: No  . Drug use: No  . Sexual activity:  Not on file  Lifestyle  . Physical activity:    Days per week: Not on file    Minutes per session: Not on file  . Stress: Not on file  Relationships  . Social connections:    Talks on phone: Not on file    Gets together: Not on file    Attends religious service: Not on file    Active member of club or organization: Not on file    Attends meetings of clubs or organizations: Not on file    Relationship status: Not on file  Other Topics Concern  . Not on file  Social History Narrative   Widower.  Retired Hydrographic surveyor.     Family History: The patient's family history is not on file. ROS:   Please see  the history of present illness.    All 14 point review of systems negative except as described per history of present illness  EKGs/Labs/Other Studies Reviewed:      Recent Labs: No results found for requested labs within last 8760 hours.  Recent Lipid Panel No results found for: CHOL, TRIG, HDL, CHOLHDL, VLDL, LDLCALC, LDLDIRECT  Physical Exam:    VS:  There were no vitals taken for this visit.    Wt Readings from Last 3 Encounters:  01/23/18 155 lb (70.3 kg)  12/13/16 160 lb (72.6 kg)  11/04/15 161 lb 8 oz (73.3 kg)     GEN:  Well nourished, well developed in no acute distress HEENT: Normal NECK: No JVD; No carotid bruits LYMPHATICS: No lymphadenopathy CARDIAC: RRR, systolic ejection murmur grade 2/6 to 3/6 best heard at the right upper portion of the sternum with radiation towards the neck, no rubs, no gallops RESPIRATORY:  Clear to auscultation without rales, wheezing or rhonchi  ABDOMEN: Soft, non-tender, non-distended MUSCULOSKELETAL:  No edema; No deformity  SKIN: Warm and dry LOWER EXTREMITIES: no swelling NEUROLOGIC:  Alert and oriented x 3 PSYCHIATRIC:  Normal affect   ASSESSMENT:    1. Dilated cardiomyopathy ([name] [name])   2. Diabetes mellitus without complication ([name] [name])    PLAN:    In order of problems listed above:  1. Critical aortic stenosis with peak and mean gradient of 107/70 mmHg.  Calculated aortic valve area was 0.37 cm.  Dimensionless index was 0.11.  I invited the patient to the room.  I spoke to him as well as to his son and I told him that this is a critical aortic stenosis.  We discussed options in this situation option being surgical intervention versus TAVR.  He was already aware of his situation and does issue has been discussed with him previously.  I explained to him the process which initially include a cardiac catheterization to assess his coronary arteries.  Then discussion about potentially improving about his valve he is willing to proceed that  way.  Therefore.  Today we will do laboratory test as a preparation for cardiac catheterization.  He is on Coumadin therefore when we find out what his INR is and when his cardiac catheterization will schedule will be able to advise about Coumadin.  I explained cardiac catheterization to him including all risk benefits as well as alternatives.  He is willing to proceed.  I asked him to lower his furosemide to only 20 mg daily. 2. Cardiomyopathy which is nonischemic.  He is on lisinopril only small dose he is also on beta-blocker also very small dose seems to be tolerating this fine.  I will continue for now but again  a very quickly will proceed with cardiac catheterization.  I advised him also not to do any strenuous activity.   Medication Adjustments/Labs and Tests Ordered: Current medicines are reviewed at length with the patient today.  Concerns regarding medicines are outlined above.  No orders of the defined types were placed in this encounter.  Medication changes: No orders of the defined types were placed in this encounter.   Signed, Park Liter, MD, [facility] 02/25/2018 4:32 PM    [place]

## 2018-02-25 NOTE — Progress Notes (Signed)
Complete echocardiogram has been performed.  Jimmy Theresia Pree RDCS 

## 2018-02-25 NOTE — Patient Instructions (Signed)
Medication Instructions:  Your physician has recommended you make the following change in your medication:  DECREASE lisinopril 2.5 mg daily DECREASE lasix 20 mg daily START 81 mg aspirin (do not take this medication until you have stopped your warfarin)  Labwork: Your physician recommends that you have the following labs drawn: CBC, BMP, and INR  Testing/Procedures: A chest x-ray takes a picture of the organs and structures inside the chest, including the heart, lungs, and blood vessels. This test can show several things, including, whether the heart is enlarges; whether fluid is building up in the lungs; and whether pacemaker / defibrillator leads are still in place.     [address] [facility] [place] [address] Dept: [phone] Loc: [place]  02/25/2018  You are scheduled for a Cardiac Catheterization (you will be called with the appointment date and time by Dr. Johnnette Gourd office)  1. Please arrive at the [facility] (Main Entrance A) at [facility]: [address] [place], [place] [address] (This time is two hours before your procedure to ensure your preparation). Free valet parking service is available.   Special note: Every effort is made to have your procedure done on time. Please understand that emergencies sometimes delay scheduled procedures.  2. Diet: Do not eat solid foods after midnight.  The patient may have clear liquids until 5am upon the day of the procedure.  3. Labs: Done today.  4. Medication instructions in preparation for your procedure:   Contrast Allergy: No  Do not take tradjenta and lasix the morning of the heart cath.  You will be instructed as to when to hold your warfarin prior to the heart cath.  On the morning of your procedure, take your Aspirin and any morning medicines NOT listed above.  You may use sips of water.  5. Plan for  one night stay--bring personal belongings. 6. Bring a current list of your medications and current insurance cards. 7. You MUST have a responsible person to drive you home. 8. Someone MUST be with you the first 24 hours after you arrive home or your discharge will be delayed. 9. Please wear clothes that are easy to get on and off and wear slip-on shoes.  Thank you for allowing [place] to care for you!   -- [facility] [place] [facility]   Follow-Up: Your physician recommends that you schedule a follow-up appointment in: 1 month  Any Other Special Instructions Will Be Listed Below (If Applicable).     If you need a refill on your cardiac medications before your next appointment, please call your pharmacy.   [place], RN, BSN   Coronary Angiogram With Stent Coronary angiogram with stent placement is a procedure to widen or open a narrow blood vessel of the heart (coronary artery). Arteries may become blocked by cholesterol buildup (plaques) in the lining of the wall. When a coronary artery becomes partially blocked, blood flow to that area decreases. This may lead to chest pain or a heart attack (myocardial infarction). A stent is a small piece of metal that looks like mesh or a spring. Stent placement may be done as treatment for a heart attack or right after a coronary angiogram in which a blocked artery is found. Let your health care provider know about:  Any allergies you have.  All medicines you are taking, including vitamins, herbs, eye drops, creams, and over-the-counter  medicines.  Any problems you or family members have had with anesthetic medicines.  Any blood disorders you have.  Any surgeries you have had.  Any medical conditions you have.  Whether you are pregnant or may be pregnant. What are the risks? Generally, this is a safe procedure. However, problems may occur, including:  Damage to the heart or its blood vessels.  A return  of blockage.  Bleeding, infection, or bruising at the insertion site.  A collection of blood under the skin (hematoma) at the insertion site.  A blood clot in another part of the body.  Kidney injury.  Allergic reaction to the dye or contrast that is used.  Bleeding into the abdomen (retroperitoneal bleeding).  What happens before the procedure? Staying hydrated Follow instructions from your health care provider about hydration, which may include:  Up to 2 hours before the procedure - you may continue to drink clear liquids, such as water, clear fruit juice, black coffee, and plain tea.  Eating and drinking restrictions Follow instructions from your health care provider about eating and drinking, which may include:  8 hours before the procedure - stop eating heavy meals or foods such as meat, fried foods, or fatty foods.  6 hours before the procedure - stop eating light meals or foods, such as toast or cereal.  2 hours before the procedure - stop drinking clear liquids.  Ask your health care provider about:  Changing or stopping your regular medicines. This is especially important if you are taking diabetes medicines or blood thinners.  Taking medicines such as ibuprofen. These medicines can thin your blood. Do not take these medicines before your procedure if your health care provider instructs you not to. Generally, aspirin is recommended before a procedure of passing a small, thin tube (catheter) through a blood vessel and into the heart (cardiac catheterization).  What happens during the procedure?  An IV tube will be inserted into one of your veins.  You will be given one or more of the following: ? A medicine to help you relax (sedative). ? A medicine to numb the area where the catheter will be inserted into an artery (local anesthetic).  To reduce your risk of infection: ? Your health care team will wash or sanitize their hands. ? Your skin will be washed with  soap. ? Hair may be removed from the area where the catheter will be inserted.  Using a guide wire, the catheter will be inserted into an artery. The location may be in your groin, in your wrist, or in the fold of your arm (near your elbow).  A type of X-ray (fluoroscopy) will be used to help guide the catheter to the opening of the arteries in the heart.  A dye will be injected into the catheter, and X-rays will be taken. The dye will help to show where any narrowing or blockages are located in the arteries.  A tiny wire will be guided to the blocked spot, and a balloon will be inflated to make the artery wider.  The stent will be expanded and will crush the plaques into the wall of the vessel. The stent will hold the area open and improve the blood flow. Most stents have a drug coating to reduce the risk of the stent narrowing over time.  The artery may be made wider using a drill, laser, or other tools to remove plaques.  When the blood flow is better, the catheter will be removed. The lining of  the artery will grow over the stent, which stays where it was placed. This procedure may vary among health care providers and hospitals. What happens after the procedure?  If the procedure is done through the leg, you will be kept in bed lying flat for about 6 hours. You will be instructed to not bend and not cross your legs.  The insertion site will be checked frequently.  The pulse in your foot or wrist will be checked frequently.  You may have additional blood tests, X-rays, and a test that records the electrical activity of your heart (electrocardiogram, or ECG). This information is not intended to replace advice given to you by your health care provider. Make sure you discuss any questions you have with your health care provider. Document Released: 12/31/2002 Document Revised: 02/24/2016 Document Reviewed: 01/30/2016 Elsevier Interactive Patient Education  [employer].

## 2018-02-25 NOTE — H&P (View-Only) (Signed)
Cardiology Office Note:    Date:  02/25/2018   ID:  [name] [name], DOB 1926/12/06, MRN [id number]  PCP:  Nicoletta Dress, MD  Cardiologist:  Jenne Campus, MD    Referring MD: Nicoletta Dress, MD   Chief Complaint  Patient presents with  . Follow up on Echo  I have echocardiogram done today  History of Present Illness:    [name] [name] is a 84 y.o. 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  Also history of low gradient significant aortic stenosis he came to our office to have an echocardiogram done I was called by my technician to look at the echocardiogram since patient had critical aortic stenosis.  Eventually at the end up talking to him in the room he described to me exertional shortness of breath as well as dizziness to the point of passing out when he does some exercises that is been going on for last few months at least.  Denies have any chest pain tightness squeezing pressure burning chest.  Past Medical History:  Diagnosis Date  . Aortic valvular stenoses    not symtomatic  . Atrial fibrillation (HCC)    echo- EF 35-40%; LA severely dilated; RA mod dilated, RV systolic pressure 42HCWC; LV systolic fcn mod reduced  . Atrial fibrillation (HCC)    stress test - post stress EF 28%, AFib w/ RVR and LBBB, apical and septal thinning; increased RV free wall uptake consistent w/ pressure and/or volume overload  . Diabetes mellitus without complication ([place])   . SSS (sick sinus syndrome) ([place])    pacemaker placed in 1993    Past Surgical History:  Procedure Laterality Date  . AV NODE ABLATION Bilateral 07/04/2013   Procedure: AV NODE ABLATION;  Surgeon: Evans Lance, MD;  Location: [facility] CATH LAB;  Service: Cardiovascular;  Laterality: Bilateral;  . BI-VENTRICULAR PACEMAKER INSERTION Bilateral 07/04/2013   Procedure: BI-VENTRICULAR PACEMAKER INSERTION  (CRT-P);  Surgeon: Evans Lance, MD;  Location: [facility] CATH LAB;  Service: Cardiovascular;  Laterality: Bilateral;  . BIV PACEMAKER GENERATOR CHANGE OUT  11/2004  . HERNIA REPAIR    . LEFT HEART CATHETERIZATION WITH CORONARY ANGIOGRAM N/A 07/07/2013   Procedure: LEFT HEART CATHETERIZATION WITH CORONARY ANGIOGRAM;  Surgeon: Pixie Casino, MD;  Location: [facility] CATH LAB;  Service: Cardiovascular;  Laterality: N/A;  . PACEMAKER INSERTION  1993    Current Medications: No outpatient medications have been marked as taking for the 02/25/18 encounter (Office Visit) with Park Liter, MD.     Allergies:   Amiodarone   Social History   Socioeconomic History  . Marital status: Widowed    Spouse name: Not on file  . Number of children: 2  . Years of education: 54  . Highest education level: Not on file  Occupational History  . Occupation: retired  Scientific laboratory technician  . Financial resource strain: Not on file  . Food insecurity:    Worry: Not on file    Inability: Not on file  . Transportation needs:    Medical: Not on file    Non-medical: Not on file  Tobacco Use  . Smoking status: Former Smoker    Last attempt to quit: 07/09/1969    Years since quitting: 48.6  . Smokeless tobacco: Never Used  Substance and Sexual Activity  . Alcohol use: No  . Drug use: No  . Sexual activity:  Not on file  Lifestyle  . Physical activity:    Days per week: Not on file    Minutes per session: Not on file  . Stress: Not on file  Relationships  . Social connections:    Talks on phone: Not on file    Gets together: Not on file    Attends religious service: Not on file    Active member of club or organization: Not on file    Attends meetings of clubs or organizations: Not on file    Relationship status: Not on file  Other Topics Concern  . Not on file  Social History Narrative   Widower.  Retired Hydrographic surveyor.     Family History: The patient's family history is not on file. ROS:   Please see  the history of present illness.    All 14 point review of systems negative except as described per history of present illness  EKGs/Labs/Other Studies Reviewed:      Recent Labs: No results found for requested labs within last 8760 hours.  Recent Lipid Panel No results found for: CHOL, TRIG, HDL, CHOLHDL, VLDL, LDLCALC, LDLDIRECT  Physical Exam:    VS:  There were no vitals taken for this visit.    Wt Readings from Last 3 Encounters:  01/23/18 155 lb (70.3 kg)  12/13/16 160 lb (72.6 kg)  11/04/15 161 lb 8 oz (73.3 kg)     GEN:  Well nourished, well developed in no acute distress HEENT: Normal NECK: No JVD; No carotid bruits LYMPHATICS: No lymphadenopathy CARDIAC: RRR, systolic ejection murmur grade 2/6 to 3/6 best heard at the right upper portion of the sternum with radiation towards the neck, no rubs, no gallops RESPIRATORY:  Clear to auscultation without rales, wheezing or rhonchi  ABDOMEN: Soft, non-tender, non-distended MUSCULOSKELETAL:  No edema; No deformity  SKIN: Warm and dry LOWER EXTREMITIES: no swelling NEUROLOGIC:  Alert and oriented x 3 PSYCHIATRIC:  Normal affect   ASSESSMENT:    1. Dilated cardiomyopathy ([place])   2. Diabetes mellitus without complication ([place])    PLAN:    In order of problems listed above:  1. Critical aortic stenosis with peak and mean gradient of 107/70 mmHg.  Calculated aortic valve area was 0.37 cm.  Dimensionless index was 0.11.  I invited the patient to the room.  I spoke to him as well as to his son and I told him that this is a critical aortic stenosis.  We discussed options in this situation option being surgical intervention versus TAVR.  He was already aware of his situation and does issue has been discussed with him previously.  I explained to him the process which initially include a cardiac catheterization to assess his coronary arteries.  Then discussion about potentially improving about his valve he is willing to proceed that  way.  Therefore.  Today we will do laboratory test as a preparation for cardiac catheterization.  He is on Coumadin therefore when we find out what his INR is and when his cardiac catheterization will schedule will be able to advise about Coumadin.  I explained cardiac catheterization to him including all risk benefits as well as alternatives.  He is willing to proceed.  I asked him to lower his furosemide to only 20 mg daily. 2. Cardiomyopathy which is nonischemic.  He is on lisinopril only small dose he is also on beta-blocker also very small dose seems to be tolerating this fine.  I will continue for now but again  a very quickly will proceed with cardiac catheterization.  I advised him also not to do any strenuous activity.   Medication Adjustments/Labs and Tests Ordered: Current medicines are reviewed at length with the patient today.  Concerns regarding medicines are outlined above.  No orders of the defined types were placed in this encounter.  Medication changes: No orders of the defined types were placed in this encounter.   Signed, Park Liter, MD, [facility] 02/25/2018 4:32 PM    [place]

## 2018-02-26 ENCOUNTER — Telehealth: Payer: Self-pay

## 2018-02-26 ENCOUNTER — Encounter: Payer: Self-pay | Admitting: Physician Assistant

## 2018-02-26 ENCOUNTER — Other Ambulatory Visit: Payer: Self-pay | Admitting: Physician Assistant

## 2018-02-26 DIAGNOSIS — I350 Nonrheumatic aortic (valve) stenosis: Secondary | ICD-10-CM

## 2018-02-26 DIAGNOSIS — R944 Abnormal results of kidney function studies: Secondary | ICD-10-CM

## 2018-02-26 LAB — BASIC METABOLIC PANEL
BUN/Creatinine Ratio: 18 (ref 10–24)
BUN: 31 mg/dL — ABNORMAL HIGH (ref 8–27)
CO2: 26 mmol/L (ref 20–29)
Calcium: 9.4 mg/dL (ref 8.6–10.2)
Chloride: 102 mmol/L (ref 96–106)
Creatinine, Ser: 1.69 mg/dL — ABNORMAL HIGH (ref 0.76–1.27)
GFR calc Af Amer: 41 mL/min/{1.73_m2} — ABNORMAL LOW (ref >59)
GFR calc non Af Amer: 35 mL/min/{1.73_m2} — ABNORMAL LOW (ref >59)
Glucose: 103 mg/dL — ABNORMAL HIGH (ref 65–99)
Potassium: 4.5 mmol/L (ref 3.5–5.2)
Sodium: 146 mmol/L — ABNORMAL HIGH (ref 134–144)

## 2018-02-26 LAB — CBC WITH DIFFERENTIAL/PLATELET
Basophils Absolute: 0.1 10*3/uL (ref 0.0–0.2)
Basos: 1 %
EOS (ABSOLUTE): 0.2 10*3/uL (ref 0.0–0.4)
Eos: 2 %
Hematocrit: 44 % (ref 37.5–51.0)
Hemoglobin: 14.7 g/dL (ref 13.0–17.7)
Immature Grans (Abs): 0 10*3/uL (ref 0.0–0.1)
Immature Granulocytes: 0 %
Lymphocytes Absolute: 2.2 10*3/uL (ref 0.7–3.1)
Lymphs: 24 %
MCH: 29.7 pg (ref 26.6–33.0)
MCHC: 33.4 g/dL (ref 31.5–35.7)
MCV: 89 fL (ref 79–97)
Monocytes Absolute: 0.9 10*3/uL (ref 0.1–0.9)
Monocytes: 10 %
Neutrophils Absolute: 5.8 10*3/uL (ref 1.4–7.0)
Neutrophils: 63 %
Platelets: 156 10*3/uL (ref 150–450)
RBC: 4.95 x10E6/uL (ref 4.14–5.80)
RDW: 14.1 % (ref 12.3–15.4)
WBC: 9.2 10*3/uL (ref 3.4–10.8)

## 2018-02-26 LAB — PROTIME-INR
INR: 2.1 — AB (ref 0.8–1.2)
Prothrombin Time: 20.7 s — ABNORMAL HIGH (ref 9.1–12.0)

## 2018-02-26 NOTE — Telephone Encounter (Signed)
[address] [place] [address] Dept: [phone] Loc: [place]  02/26/2018  You are scheduled for a Cardiac Catheterization on Wednesday, August 28 with Dr. Lauree Chandler.  1. Please arrive at the [facility] (Main Entrance A) at [facility]: [address] [place], [place] [address] at 8:00 AM (This time is two hours before your procedure to ensure your preparation). Free valet parking service is available.   Special note: Every effort is made to have your procedure done on time. Please understand that emergencies sometimes delay scheduled procedures.  2. Diet: Do not eat solid foods after midnight.  The patient may have clear liquids until 5am upon the day of the procedure.  3. Labs: You will need to have blood drawn on Monday, August 26 at the [facility] office. You do not need to be fasting.  4. Medication instructions in preparation for your procedure:  Contrast Allergy: No  Stop taking Coumadin (Warfarin) on Friday, August 23.  Stop taking, Lisinopril (Zestril or Prinivil) on Tuesday, August 20., Lasix (Furosemide)  on Tuesday, August 20.  On the morning of your procedure, take your Aspirin and any morning medicines NOT listed above.  You may use sips of water.  5. Plan for one night stay--bring personal belongings.  6. Bring a current list of your medications and current insurance cards.  7. You MUST have a responsible person to drive you home.  8. Someone MUST be with you the first 24 hours after you arrive home or your discharge will be delayed.  9. Please wear clothes that are easy to get on and off and wear slip-on shoes.  Thank you for allowing [place] to care for you!   -- [place] [facility]

## 2018-02-26 NOTE — Telephone Encounter (Signed)
-----   Message from Sanda Klein, MD sent at 02/26/2018 10:53 AM EDT ----- Please stop lisinopril and furosemide. Stop warfarin on Friday Aug 23. Recheck BMET on Monday Aug 26. Anticipate cath on Wednesday Aug 28 for severe AS preTAVR

## 2018-03-04 LAB — BASIC METABOLIC PANEL
BUN/Creatinine Ratio: 15 (ref 10–24)
BUN: 26 mg/dL (ref 8–27)
CHLORIDE: 105 mmol/L (ref 96–106)
CO2: 23 mmol/L (ref 20–29)
Calcium: 9.5 mg/dL (ref 8.6–10.2)
Creatinine, Ser: 1.68 mg/dL — ABNORMAL HIGH (ref 0.76–1.27)
GFR calc Af Amer: 41 mL/min/{1.73_m2} — ABNORMAL LOW (ref >59)
GFR calc non Af Amer: 36 mL/min/{1.73_m2} — ABNORMAL LOW (ref >59)
GLUCOSE: 129 mg/dL — AB (ref 65–99)
POTASSIUM: 4.5 mmol/L (ref 3.5–5.2)
Sodium: 143 mmol/L (ref 134–144)

## 2018-03-05 ENCOUNTER — Telehealth: Payer: Self-pay

## 2018-03-05 NOTE — Telephone Encounter (Signed)
Called and notified pt of chest x-ray results, per [facility] pt's chest x-ray was normal.

## 2018-03-06 ENCOUNTER — Ambulatory Visit (HOSPITAL_COMMUNITY)
Admission: RE | Admit: 2018-03-06 | Discharge: 2018-03-06 | Disposition: A | Discharge status: Home / Self Care | Payer: Medicare Other | Source: Ambulatory Visit | Attending: Cardiovascular Disease | Admitting: Cardiovascular Disease

## 2018-03-06 ENCOUNTER — Other Ambulatory Visit: Payer: Self-pay | Admitting: Physician Assistant

## 2018-03-06 ENCOUNTER — Encounter (HOSPITAL_COMMUNITY)
Admission: RE | Disposition: A | Discharge status: Home / Self Care | Payer: Self-pay | Source: Ambulatory Visit | Attending: Cardiovascular Disease

## 2018-03-06 DIAGNOSIS — Z888 Allergy status to other drugs, medicaments and biological substances status: Secondary | ICD-10-CM | POA: Diagnosis not present

## 2018-03-06 DIAGNOSIS — Z950 Presence of cardiac pacemaker: Secondary | ICD-10-CM | POA: Insufficient documentation

## 2018-03-06 DIAGNOSIS — I2510 Atherosclerotic heart disease of native coronary artery without angina pectoris: Secondary | ICD-10-CM | POA: Diagnosis not present

## 2018-03-06 DIAGNOSIS — I495 Sick sinus syndrome: Secondary | ICD-10-CM | POA: Diagnosis not present

## 2018-03-06 DIAGNOSIS — E119 Type 2 diabetes mellitus without complications: Secondary | ICD-10-CM | POA: Insufficient documentation

## 2018-03-06 DIAGNOSIS — Z87891 Personal history of nicotine dependence: Secondary | ICD-10-CM | POA: Diagnosis not present

## 2018-03-06 DIAGNOSIS — I4891 Unspecified atrial fibrillation: Secondary | ICD-10-CM | POA: Diagnosis not present

## 2018-03-06 DIAGNOSIS — Z7901 Long term (current) use of anticoagulants: Secondary | ICD-10-CM | POA: Diagnosis not present

## 2018-03-06 DIAGNOSIS — I350 Nonrheumatic aortic (valve) stenosis: Secondary | ICD-10-CM

## 2018-03-06 DIAGNOSIS — Z955 Presence of coronary angioplasty implant and graft: Secondary | ICD-10-CM | POA: Diagnosis not present

## 2018-03-06 DIAGNOSIS — I420 Dilated cardiomyopathy: Secondary | ICD-10-CM | POA: Diagnosis not present

## 2018-03-06 DIAGNOSIS — Z9889 Other specified postprocedural states: Secondary | ICD-10-CM | POA: Insufficient documentation

## 2018-03-06 HISTORY — PX: RIGHT/LEFT HEART CATH AND CORONARY ANGIOGRAPHY: CATH118266

## 2018-03-06 LAB — POCT I-STAT 3, VENOUS BLOOD GAS (G3P V)
ACID-BASE DEFICIT: 4 mmol/L — AB (ref 0.0–2.0)
BICARBONATE: 21.6 mmol/L (ref 20.0–28.0)
O2 SAT: 62 %
PH VEN: 7.326 (ref 7.250–7.430)
PO2 VEN: 35 mmHg (ref 32.0–45.0)
TCO2: 23 mmol/L (ref 22–32)
pCO2, Ven: 41.4 mmHg — ABNORMAL LOW (ref 44.0–60.0)

## 2018-03-06 LAB — PROTIME-INR
INR: 1.22
PROTHROMBIN TIME: 15.3 s — AB (ref 11.4–15.2)

## 2018-03-06 LAB — GLUCOSE, CAPILLARY: GLUCOSE-CAPILLARY: 70 mg/dL (ref 70–99)

## 2018-03-06 SURGERY — RIGHT/LEFT HEART CATH AND CORONARY ANGIOGRAPHY
Anesthesia: LOCAL

## 2018-03-06 MED ORDER — HEPARIN SODIUM (PORCINE) 1000 UNIT/ML IJ SOLN
INTRAMUSCULAR | Status: AC
  Filled 2018-03-06: qty 1

## 2018-03-06 MED ORDER — SODIUM CHLORIDE 0.9 % IV SOLN: 250.0000 mL | INTRAVENOUS | Status: DC | PRN

## 2018-03-06 MED ORDER — LIDOCAINE HCL (PF) 1 % IJ SOLN
INTRAMUSCULAR | Status: AC
  Filled 2018-03-06: qty 30

## 2018-03-06 MED ORDER — HYDRALAZINE HCL 20 MG/ML IJ SOLN
INTRAMUSCULAR | Status: AC
  Filled 2018-03-06: qty 1

## 2018-03-06 MED ORDER — VERAPAMIL HCL 2.5 MG/ML IV SOLN
INTRAVENOUS | Status: AC
  Filled 2018-03-06: qty 2

## 2018-03-06 MED ORDER — SODIUM CHLORIDE 0.9 % WEIGHT BASED INFUSION: 1.0000 mL/kg/h | INTRAVENOUS | Status: DC

## 2018-03-06 MED ORDER — SODIUM CHLORIDE 0.9 % IV SOLN: INTRAVENOUS | Status: DC

## 2018-03-06 MED ORDER — SODIUM CHLORIDE 0.9% FLUSH: 3.0000 mL | INTRAVENOUS | Status: DC | PRN

## 2018-03-06 MED ORDER — SODIUM CHLORIDE 0.9% FLUSH: 3.0000 mL | Freq: Two times a day (BID) | INTRAVENOUS | Status: DC

## 2018-03-06 MED ORDER — MIDAZOLAM HCL 2 MG/2ML IJ SOLN
INTRAMUSCULAR | Status: AC
  Filled 2018-03-06: qty 2

## 2018-03-06 MED ORDER — HEPARIN (PORCINE) IN NACL 1000-0.9 UT/500ML-% IV SOLN
INTRAVENOUS | Status: DC | PRN
  Administered 2018-03-06 (×2): 500 mL

## 2018-03-06 MED ORDER — VERAPAMIL HCL 2.5 MG/ML IV SOLN
INTRAVENOUS | Status: DC | PRN
  Administered 2018-03-06: 10 mL via INTRA_ARTERIAL

## 2018-03-06 MED ORDER — ASPIRIN 81 MG PO CHEW
81.0000 mg | CHEWABLE_TABLET | ORAL | Status: AC
  Administered 2018-03-06: 81 mg via ORAL
  Filled 2018-03-06: qty 1

## 2018-03-06 MED ORDER — MIDAZOLAM HCL 2 MG/2ML IJ SOLN
INTRAMUSCULAR | Status: DC | PRN
  Administered 2018-03-06 (×2): 0.5 mg via INTRAVENOUS

## 2018-03-06 MED ORDER — HYDRALAZINE HCL 20 MG/ML IJ SOLN
INTRAMUSCULAR | Status: DC | PRN
  Administered 2018-03-06: 10 mg via INTRAVENOUS

## 2018-03-06 MED ORDER — IOHEXOL 350 MG/ML SOLN
INTRAVENOUS | Status: DC | PRN
  Administered 2018-03-06: 30 mL via INTRACARDIAC

## 2018-03-06 MED ORDER — HEPARIN SODIUM (PORCINE) 1000 UNIT/ML IJ SOLN
INTRAMUSCULAR | Status: DC | PRN
  Administered 2018-03-06: 3500 [IU] via INTRAVENOUS

## 2018-03-06 MED ORDER — SODIUM CHLORIDE 0.9 % WEIGHT BASED INFUSION
3.0000 mL/kg/h | INTRAVENOUS | Status: AC
  Administered 2018-03-06: 3 mL/kg/h via INTRAVENOUS

## 2018-03-06 MED ORDER — LIDOCAINE HCL (PF) 1 % IJ SOLN
INTRAMUSCULAR | Status: DC | PRN
  Administered 2018-03-06: 2 mL
  Administered 2018-03-06: 15 mL

## 2018-03-06 MED ORDER — FENTANYL CITRATE (PF) 100 MCG/2ML IJ SOLN
INTRAMUSCULAR | Status: AC
  Filled 2018-03-06: qty 2

## 2018-03-06 MED ORDER — FENTANYL CITRATE (PF) 100 MCG/2ML IJ SOLN
INTRAMUSCULAR | Status: DC | PRN
  Administered 2018-03-06: 25 ug via INTRAVENOUS

## 2018-03-06 MED ORDER — ACETAMINOPHEN 325 MG PO TABS: 650.0000 mg | ORAL_TABLET | ORAL | Status: DC | PRN

## 2018-03-06 MED ORDER — ONDANSETRON HCL 4 MG/2ML IJ SOLN: 4.0000 mg | Freq: Four times a day (QID) | INTRAMUSCULAR | Status: DC | PRN

## 2018-03-06 MED ORDER — HEPARIN (PORCINE) IN NACL 1000-0.9 UT/500ML-% IV SOLN
INTRAVENOUS | Status: AC
  Filled 2018-03-06: qty 1000

## 2018-03-06 SURGICAL SUPPLY — 17 items
CATH INFINITI 5 FR JL3.5 (CATHETERS) ×2 IMPLANT
CATH INFINITI 5FR AL1 (CATHETERS) ×2 IMPLANT
CATH INFINITI JR4 5F (CATHETERS) ×2 IMPLANT
CATH SWAN GANZ 7F STRAIGHT (CATHETERS) ×2 IMPLANT
DEVICE RAD COMP TR BAND LRG (VASCULAR PRODUCTS) ×2 IMPLANT
GLIDESHEATH SLEND SS 6F .021 (SHEATH) ×2 IMPLANT
GUIDEWIRE INQWIRE 1.5J.035X260 (WIRE) ×1 IMPLANT
INQWIRE 1.5J .035X260CM (WIRE) ×2
KIT HEART LEFT (KITS) ×2 IMPLANT
KIT HEART RIGHT NAMIC (KITS) ×2 IMPLANT
PACK CARDIAC CATHETERIZATION (CUSTOM PROCEDURE TRAY) ×2 IMPLANT
SHEATH GLIDE SLENDER 4/5FR (SHEATH) ×2 IMPLANT
SHEATH PINNACLE 7F 10CM (SHEATH) ×2 IMPLANT
SHEATH PROBE COVER 6X72 (BAG) ×2 IMPLANT
TRANSDUCER W/STOPCOCK (MISCELLANEOUS) ×2 IMPLANT
TUBING CIL FLEX 10 FLL-RA (TUBING) ×2 IMPLANT
WIRE EMERALD ST .035X150CM (WIRE) ×2 IMPLANT

## 2018-03-06 NOTE — Interval H&P Note (Signed)
History and Physical Interval Note:  03/06/2018 10:19 AM  [name] [name]  has presented today for cardiac cath with the diagnosis of severe AS. The various methods of treatment have been discussed with the patient and family. After consideration of risks, benefits and other options for treatment, the patient has consented to  Procedure(s): RIGHT/LEFT HEART CATH AND CORONARY ANGIOGRAPHY (N/A) as a surgical intervention .  The patient's history has been reviewed, patient examined, no change in status, stable for surgery.  I have reviewed the patient's chart and labs.  Questions were answered to the patient's satisfaction.    Cath Lab Visit (complete for each Cath Lab visit)  Clinical Evaluation Leading to the Procedure:   ACS: No.  Non-ACS:    Anginal Classification: CCS III  Anti-ischemic medical therapy: Minimal Therapy (1 class of medications)  Non-Invasive Test Results: No non-invasive testing performed  Prior CABG: No previous CABG        Lauree Chandler

## 2018-03-06 NOTE — Discharge Instructions (Signed)
Resume coumadin tonight.   Radial Site Care Refer to this sheet in the next few weeks. These instructions provide you with information about caring for yourself after your procedure. Your health care provider may also give you more specific instructions. Your treatment has been planned according to current medical practices, but problems sometimes occur. Call your health care provider if you have any problems or questions after your procedure. What can I expect after the procedure? After your procedure, it is typical to have the following:  Bruising at the radial site that usually fades within 1-2 weeks.  Blood collecting in the tissue (hematoma) that may be painful to the touch. It should usually decrease in size and tenderness within 1-2 weeks.  Follow these instructions at home:  Take medicines only as directed by your health care provider.  You may shower 24-48 hours after the procedure or as directed by your health care provider. Remove the bandage (dressing) and gently wash the site with plain soap and water. Pat the area dry with a clean towel. Do not rub the site, because this may cause bleeding.  Do not take baths, swim, or use a hot tub until your health care provider approves.  Check your insertion site every day for redness, swelling, or drainage.  Do not apply powder or lotion to the site.  Do not flex or bend the affected arm for 24 hours or as directed by your health care provider.  Do not push or pull heavy objects with the affected arm for 24 hours or as directed by your health care provider.  Do not lift over 10 lb (4.5 kg) for 5 days after your procedure or as directed by your health care provider.  Ask your health care provider when it is okay to: ? Return to work or school. ? Resume usual physical activities or sports. ? Resume sexual activity.  Do not drive home if you are discharged the same day as the procedure. Have someone else drive you.  You may drive  24 hours after the procedure unless otherwise instructed by your health care provider.  Do not operate machinery or power tools for 24 hours after the procedure.  If your procedure was done as an outpatient procedure, which means that you went home the same day as your procedure, a responsible adult should be with you for the first 24 hours after you arrive home.  Keep all follow-up visits as directed by your health care provider. This is important. Contact a health care provider if:  You have a fever.  You have chills.  You have increased bleeding from the radial site. Hold pressure on the site. Get help right away if:  You have unusual pain at the radial site.  You have redness, warmth, or swelling at the radial site.  You have drainage (other than a small amount of blood on the dressing) from the radial site.  The radial site is bleeding, and the bleeding does not stop after 30 minutes of holding steady pressure on the site.  Your arm or hand becomes pale, cool, tingly, or numb. This information is not intended to replace advice given to you by your health care provider. Make sure you discuss any questions you have with your health care provider. Document Released: 07/29/2010 Document Revised: 12/02/2015 Document Reviewed: 01/12/2014 Elsevier Interactive Patient Education  2018 [place].   Femoral Site Care Refer to this sheet in the next few weeks. These instructions provide you with information about  caring for yourself after your procedure. Your health care provider may also give you more specific instructions. Your treatment has been planned according to current medical practices, but problems sometimes occur. Call your health care provider if you have any problems or questions after your procedure. What can I expect after the procedure? After your procedure, it is typical to have the following:  Bruising at the site that usually fades within 1-2 weeks.  Blood  collecting in the tissue (hematoma) that may be painful to the touch. It should usually decrease in size and tenderness within 1-2 weeks.  Follow these instructions at home:  Take medicines only as directed by your health care provider.  You may shower 24-48 hours after the procedure or as directed by your health care provider. Remove the bandage (dressing) and gently wash the site with plain soap and water. Pat the area dry with a clean towel. Do not rub the site, because this may cause bleeding.  Do not take baths, swim, or use a hot tub until your health care provider approves.  Check your insertion site every day for redness, swelling, or drainage.  Do not apply powder or lotion to the site.  Limit use of stairs to twice a day for the first 2-3 days or as directed by your health care provider.  Do not squat for the first 2-3 days or as directed by your health care provider.  Do not lift over 10 lb (4.5 kg) for 5 days after your procedure or as directed by your health care provider.  Ask your health care provider when it is okay to: ? Return to work or school. ? Resume usual physical activities or sports. ? Resume sexual activity.  Do not drive home if you are discharged the same day as the procedure. Have someone else drive you.  You may drive 24 hours after the procedure unless otherwise instructed by your health care provider.  Do not operate machinery or power tools for 24 hours after the procedure or as directed by your health care provider.  If your procedure was done as an outpatient procedure, which means that you went home the same day as your procedure, a responsible adult should be with you for the first 24 hours after you arrive home.  Keep all follow-up visits as directed by your health care provider. This is important. Contact a health care provider if:  You have a fever.  You have chills.  You have increased bleeding from the site. Hold pressure on the  site. Get help right away if:  You have unusual pain at the site.  You have redness, warmth, or swelling at the site.  You have drainage (other than a small amount of blood on the dressing) from the site.  The site is bleeding, and the bleeding does not stop after 30 minutes of holding steady pressure on the site.  Your leg or foot becomes pale, cool, tingly, or numb. This information is not intended to replace advice given to you by your health care provider. Make sure you discuss any questions you have with your health care provider. Document Released: 02/27/2014 Document Revised: 12/02/2015 Document Reviewed: 01/13/2014 Elsevier Interactive Patient Education  [employer].

## 2018-03-06 NOTE — Progress Notes (Signed)
Thank you, [name] [name]

## 2018-03-07 ENCOUNTER — Encounter (HOSPITAL_COMMUNITY): Payer: Self-pay | Admitting: Cardiovascular Disease

## 2018-03-07 ENCOUNTER — Other Ambulatory Visit: Payer: Self-pay | Admitting: Physician Assistant

## 2018-03-13 ENCOUNTER — Ambulatory Visit (INDEPENDENT_AMBULATORY_CARE_PROVIDER_SITE_OTHER): Payer: Medicare Other | Admitting: *Deleted

## 2018-03-13 DIAGNOSIS — I420 Dilated cardiomyopathy: Secondary | ICD-10-CM | POA: Diagnosis not present

## 2018-03-14 NOTE — Progress Notes (Signed)
Remote pacemaker transmission.   

## 2018-03-21 ENCOUNTER — Ambulatory Visit (HOSPITAL_BASED_OUTPATIENT_CLINIC_OR_DEPARTMENT_OTHER)
Admission: RE | Admit: 2018-03-21 | Discharge: 2018-03-21 | Disposition: A | Discharge status: Home / Self Care | Payer: Medicare Other | Source: Ambulatory Visit | Attending: Physician Assistant | Admitting: Physician Assistant

## 2018-03-21 ENCOUNTER — Ambulatory Visit (HOSPITAL_COMMUNITY)
Admission: RE | Admit: 2018-03-21 | Discharge: 2018-03-21 | Disposition: A | Discharge status: Home / Self Care | Payer: Medicare Other | Source: Ambulatory Visit | Attending: Physician Assistant | Admitting: Physician Assistant

## 2018-03-21 DIAGNOSIS — I350 Nonrheumatic aortic (valve) stenosis: Secondary | ICD-10-CM

## 2018-03-21 DIAGNOSIS — J449 Chronic obstructive pulmonary disease, unspecified: Secondary | ICD-10-CM | POA: Insufficient documentation

## 2018-03-21 DIAGNOSIS — Z87891 Personal history of nicotine dependence: Secondary | ICD-10-CM | POA: Diagnosis not present

## 2018-03-21 LAB — PULMONARY FUNCTION TEST
DL/VA % pred: 66 %
DL/VA: 3.03 ml/min/mmHg/L
DLCO UNC % PRED: 47 %
DLCO UNC: 15.03 ml/min/mmHg
FEF 25-75 PRE: 0.95 L/s
FEF 25-75 Post: 2.42 L/sec
FEF2575-%Change-Post: 155 %
FEF2575-%PRED-POST: 163 %
FEF2575-%Pred-Pre: 63 %
FEV1-%CHANGE-POST: 30 %
FEV1-%PRED-PRE: 69 %
FEV1-%Pred-Post: 90 %
FEV1-POST: 2.2 L
FEV1-Pre: 1.68 L
FEV1FVC-%Change-Post: 10 %
FEV1FVC-%Pred-Pre: 94 %
FEV6-%CHANGE-POST: 18 %
FEV6-%PRED-POST: 92 %
FEV6-%Pred-Pre: 77 %
FEV6-POST: 3 L
FEV6-PRE: 2.53 L
FEV6FVC-%CHANGE-POST: 0 %
FEV6FVC-%PRED-PRE: 108 %
FEV6FVC-%Pred-Post: 108 %
FVC-%Change-Post: 18 %
FVC-%Pred-Post: 84 %
FVC-%Pred-Pre: 71 %
FVC-Post: 3 L
FVC-Pre: 2.54 L
POST FEV1/FVC RATIO: 73 %
POST FEV6/FVC RATIO: 100 %
PRE FEV6/FVC RATIO: 100 %
Pre FEV1/FVC ratio: 66 %
RV % PRED: 267 %
RV: 7.53 L
TLC % PRED: 155 %
TLC: 10.89 L

## 2018-03-21 MED ORDER — SODIUM BICARBONATE 8.4 % IV SOLN
INTRAVENOUS | Status: DC
  Filled 2018-03-21: qty 500

## 2018-03-21 MED ORDER — SODIUM BICARBONATE BOLUS VIA INFUSION
INTRAVENOUS | Status: AC
  Administered 2018-03-21 (×2): 75 meq via INTRAVENOUS
  Filled 2018-03-21: qty 1

## 2018-03-21 MED ORDER — ALBUTEROL SULFATE (2.5 MG/3ML) 0.083% IN NEBU
2.5000 mg | INHALATION_SOLUTION | Freq: Once | RESPIRATORY_TRACT | Status: AC
  Administered 2018-03-21: 2.5 mg via RESPIRATORY_TRACT

## 2018-03-21 MED ORDER — IOPAMIDOL (ISOVUE-370) INJECTION 76%
100.0000 mL | Freq: Once | INTRAVENOUS | Status: AC | PRN
  Administered 2018-03-21: 100 mL via INTRAVENOUS

## 2018-03-21 NOTE — Progress Notes (Signed)
Carotid duplex prelim:  Right Carotid:Velocities in the 1-39% range, however this is most likely underestimated due to aortic valve issues, as the velocity doubles from distal CCA as well as significant plaque formation.  Left Carotid:Velocities in the left ICA are consistent with a 1-39% stenosis.  Landry Mellow, RDMS, RVT

## 2018-03-25 ENCOUNTER — Other Ambulatory Visit: Payer: Self-pay

## 2018-03-25 ENCOUNTER — Encounter: Payer: Self-pay | Admitting: Thoracic Surgery (Cardiothoracic Vascular Surgery)

## 2018-03-25 ENCOUNTER — Encounter: Payer: Self-pay | Admitting: Physical Therapy

## 2018-03-25 ENCOUNTER — Ambulatory Visit: Payer: Medicare Other | Attending: Physician Assistant | Admitting: Physical Therapy

## 2018-03-25 ENCOUNTER — Institutional Professional Consult (permissible substitution): Payer: Medicare Other | Admitting: Thoracic Surgery (Cardiothoracic Vascular Surgery)

## 2018-03-25 VITALS — BP 122/81 | HR 88 | Resp 16 | Ht 69.5 in | Wt 155.0 lb

## 2018-03-25 DIAGNOSIS — I350 Nonrheumatic aortic (valve) stenosis: Secondary | ICD-10-CM | POA: Diagnosis not present

## 2018-03-25 DIAGNOSIS — I5042 Chronic combined systolic (congestive) and diastolic (congestive) heart failure: Secondary | ICD-10-CM

## 2018-03-25 DIAGNOSIS — R2689 Other abnormalities of gait and mobility: Secondary | ICD-10-CM | POA: Insufficient documentation

## 2018-03-25 DIAGNOSIS — I482 Chronic atrial fibrillation: Secondary | ICD-10-CM

## 2018-03-25 DIAGNOSIS — I4821 Permanent atrial fibrillation: Secondary | ICD-10-CM

## 2018-03-25 NOTE — Patient Instructions (Signed)
Stop taking Coumadin (warfarin) on Thursday 9/26  Continue taking all other medications without change through the day before surgery.  Have nothing to eat or drink after midnight the night before surgery.  On the morning of surgery take only Sinemet with a sip of water.

## 2018-03-25 NOTE — Progress Notes (Signed)
[facility]  MULTIDISCIPLINARY HEART VALVE CLINIC  CARDIOTHORACIC SURGERY CONSULTATION REPORT  Referring Provider is Croitoru, Dani Gobble, MD PCP is Nicoletta Dress, MD  Chief Complaint  Patient presents with  . Aortic Stenosis    TAVR EVAL...all sturdies completed including exercise test this am    HPI:  Patient is an 84 year old male with history of aortic stenosis, nonischemic cardiomyopathy, chronic combined systolic and diastolic congestive heart failure, permanent atrial fibrillation status post AV node ablation and permanent pacemaker implantation, biventricular pacemaker upgrade, and type 2 diabetes mellitus who has been referred for surgical consultation to discuss treatment options for management of severe symptomatic aortic stenosis.  Patient's cardiac history dates back to 1995 when he originally presented with atrial fibrillation.  He was followed for many years by Dr. Rollene Fare and more recently has been followed by Dr. Sallyanne Kuster.  He has been on long-term warfarin anticoagulation and underwent AV node ablation with permanent pacemaker placement.  He had a total of 3 pacemakers, most recently 2014 when his device was upgraded to biventricular pacer.  He was noted to have aortic stenosis that initially was mild to moderate but has gradually increase in severity.  He was seen in follow-up by Dr. Sallyanne Kuster in July and noted to complain of exertional shortness of breath and episodes of dizziness that occur with exertion.  A follow-up echocardiogram was performed February 25, 2018 and revealed critical aortic stenosis.  Peak velocity across aortic valve was measured 5.2 m/s corresponding to mean transvalvular gradient estimated 70 mmHg.  Left ventricular ejection fraction was estimated 35 to 40%.  Catheterization was performed March 06, 2018 and confirmed the presence of severe aortic stenosis.  Mean transvalvular gradient was measured 49.9 mmHg at catheterization, corresponding  to aortic valve area 0.72 cm.  There was mild nonobstructive coronary artery disease and moderate pulmonary hypertension.  CT angiography was performed and the patient referred for surgical consultation.  Patient is a widower and lives alone in [place].  He has 2 adult children and several grandchildren, all of whom live close by and are very supportive.  The patient remains remarkably independent and physical.  He does not drive an automobile because of poor vision.  He enjoys working in his yard and spends most of his time outdoors.  He complains of a 76-month history of progressive symptoms of exertional shortness of breath.  He now gets short of breath with moderate level activity, and at times he develops severe dizzy spells with exertion.  He has not had any frank syncopal episodes.  He denies any exertional chest pain or chest tightness.  He has not had resting shortness of breath, PND, orthopnea, or lower extremity edema.  He does not have significant arthritis in his mobility is quite good.  He has been chronically anticoagulated using warfarin for many years.  He has not had any significant bleeding complications nor history of stroke.  Past Medical History:  Diagnosis Date  . Atrial fibrillation (HCC)    echo- EF 35-40%; LA severely dilated; RA mod dilated, RV systolic pressure 65HQIO; LV systolic fcn mod reduced  . Diabetes mellitus without complication ([place])   . Severe aortic stenosis   . SSS (sick sinus syndrome) ([place])    pacemaker placed in 1993    Past Surgical History:  Procedure Laterality Date  . AV NODE ABLATION Bilateral 07/04/2013   Procedure: AV NODE ABLATION;  Surgeon: Evans Lance, MD;  Location: [facility] CATH LAB;  Service: Cardiovascular;  Laterality:  Bilateral;  . BI-VENTRICULAR PACEMAKER INSERTION Bilateral 07/04/2013   Procedure: BI-VENTRICULAR PACEMAKER INSERTION (CRT-P);  Surgeon: Evans Lance, MD;  Location: [facility] CATH LAB;  Service: Cardiovascular;  Laterality:  Bilateral;  . BIV PACEMAKER GENERATOR CHANGE OUT  11/2004  . HERNIA REPAIR    . LEFT HEART CATHETERIZATION WITH CORONARY ANGIOGRAM N/A 07/07/2013   Procedure: LEFT HEART CATHETERIZATION WITH CORONARY ANGIOGRAM;  Surgeon: Pixie Casino, MD;  Location: [facility] CATH LAB;  Service: Cardiovascular;  Laterality: N/A;  . PACEMAKER INSERTION  1993  . RIGHT/LEFT HEART CATH AND CORONARY ANGIOGRAPHY N/A 03/06/2018   Procedure: RIGHT/LEFT HEART CATH AND CORONARY ANGIOGRAPHY;  Surgeon: Burnell Blanks, MD;  Location: [facility] CV LAB;  Service: Cardiovascular;  Laterality: N/A;    No family history on file.  Social History   Socioeconomic History  . Marital status: Widowed    Spouse name: Not on file  . Number of children: 2  . Years of education: 44  . Highest education level: Not on file  Occupational History  . Occupation: retired  Scientific laboratory technician  . Financial resource strain: Not on file  . Food insecurity:    Worry: Not on file    Inability: Not on file  . Transportation needs:    Medical: Not on file    Non-medical: Not on file  Tobacco Use  . Smoking status: Former Smoker    Last attempt to quit: 07/09/1969    Years since quitting: 48.7  . Smokeless tobacco: Never Used  Substance and Sexual Activity  . Alcohol use: No  . Drug use: No  . Sexual activity: Not on file  Lifestyle  . Physical activity:    Days per week: Not on file    Minutes per session: Not on file  . Stress: Not on file  Relationships  . Social connections:    Talks on phone: Not on file    Gets together: Not on file    Attends religious service: Not on file    Active member of club or organization: Not on file    Attends meetings of clubs or organizations: Not on file    Relationship status: Not on file  . Intimate partner violence:    Fear of current or ex partner: Not on file    Emotionally abused: Not on file    Physically abused: Not on file    Forced sexual activity: Not on file  Other Topics  Concern  . Not on file  Social History Narrative   Widower.  Retired Hydrographic surveyor.    Current Outpatient Medications  Medication Sig Dispense Refill  . acetaminophen (TYLENOL) 325 MG tablet Take 1-2 tablets (325-650 mg total) by mouth every 4 (four) hours as needed for mild pain.    Marland Kitchen aspirin EC 81 MG tablet Take 1 tablet (81 mg total) by mouth daily. 90 tablet 3  . carbidopa-levodopa (SINEMET IR) 25-100 MG per tablet Take 1 tablet by mouth 3 (three) times daily.    . diphenhydrAMINE (BENADRYL) 25 mg capsule Take 25 mg by mouth at bedtime as needed for allergies or sleep.    . furosemide (LASIX) 40 MG tablet Take 40 mg by mouth daily.    Marland Kitchen linagliptin (TRADJENTA) 5 MG TABS tablet Take 5 mg by mouth daily.    Marland Kitchen lisinopril (PRINIVIL,ZESTRIL) 5 MG tablet Take 0.5 tablets (2.5 mg total) by mouth daily. 60 tablet 1  . metoprolol succinate (TOPROL-XL) 50 MG 24 hr tablet Take 25 mg by  mouth daily. Take with or immediately following a meal.    . Multiple Vitamins-Minerals (PRESERVISION AREDS 2) CAPS Take 1 capsule by mouth 2 (two) times daily.     . Omega-3 Fatty Acids (FISH OIL) 1200 MG CAPS Take 1,200 mg by mouth 2 (two) times daily.    . vitamin E 400 UNIT capsule Take 400 Units by mouth daily.    Marland Kitchen warfarin (COUMADIN) 5 MG tablet Take 5 mg by mouth See admin instructions. Take 5 mg by mouth every evening except Wednesday take 2.5 mg by mouth in the evening     No current facility-administered medications for this visit.     Allergies  Allergen Reactions  . Amiodarone Other (See Comments)    Amiodarone induced hepatitis      Review of Systems:   General:  normal appetite, decreased energy, no weight gain, no weight loss, no fever  Cardiac:  no chest pain with exertion, no chest pain at rest, +SOB with exertion, no resting SOB, no PND, no orthopnea, no palpitations, + arrhythmia, + atrial fibrillation, no LE edema, + dizzy spells, no syncope  Respiratory:  + exertional shortness of  breath, no home oxygen, no productive cough, no dry cough, no bronchitis, no wheezing, no hemoptysis, no asthma, no pain with inspiration or cough, no sleep apnea, no CPAP at night  GI:   no difficulty swallowing, + reflux, no frequent heartburn, no hiatal hernia, no abdominal pain, no constipation, no diarrhea, no hematochezia, no hematemesis, no melena  GU:   no dysuria,  no frequency, no urinary tract infection, no hematuria, no enlarged prostate, no kidney stones, no kidney disease  Vascular:  no pain suggestive of claudication, no pain in feet, no leg cramps, no varicose veins, no DVT, no non-healing foot ulcer  Neuro:   no stroke, no TIA's, no seizures, no headaches, no temporary blindness one eye,  no slurred speech, no peripheral neuropathy, no chronic pain, no instability of gait, no memory/cognitive dysfunction  Musculoskeletal: no arthritis, no joint swelling, no myalgias, no difficulty walking, normal mobility   Skin:   no rash, no itching, no skin infections, no pressure sores or ulcerations  Psych:   no anxiety, no depression, no nervousness, no unusual recent stress  Eyes:   + blurry vision, no floaters, + recent vision changes, + wears glasses or contacts  ENT:   no hearing loss, no loose or painful teeth, + dentures, last saw dentist  Hematologic:  + easy bruising, no abnormal bleeding, no clotting disorder, no frequent epistaxis  Endocrine:  + diabetes, does check CBG's at home           Physical Exam:   BP 122/81 (BP Location: Right Arm, Patient Position: Sitting, Cuff Size: Normal)   Pulse 88   Resp 16   Ht 5' 9.5" (1.765 m)   Wt 155 lb (70.3 kg)   SpO2 95% Comment: ON RA  BMI 22.56 kg/m   General:  Elderly,  well-appearing  HEENT:  Unremarkable   Neck:   no JVD, no bruits, no adenopathy   Chest:   clear to auscultation, symmetrical breath sounds, no wheezes, no rhonchi   CV:   Irregular rate and rhythm, grade III/VI crescendo/decrescendo murmur heard best at LLSB,   no diastolic murmur  Abdomen:  soft, non-tender, no masses   Extremities:  warm, well-perfused, pulses palpable, no LE edema  Rectal/GU  Deferred  Neuro:   Grossly non-focal and symmetrical throughout  Skin:   Clean  and dry, no rashes, no breakdown   Diagnostic Tests:  Transthoracic Echocardiography  Patient:    [place], [place] MR #:       [id number] Study Date: 02/25/2018 Gender:     M Age:        89 Height:     175.3 cm Weight:     70.3 kg BSA:        1.85 m^2 Pt. Status: Room:   ATTENDING    Sanda Klein, MD  ORDERING     Sanda Klein, MD  REFERRING    Sanda Klein, MD  SONOGRAPHER  Jimmy Reel, RDCS  PERFORMING   [facility], [place]  cc:  ------------------------------------------------------------------- LV EF: 35% -   40%  ------------------------------------------------------------------- Indications:      Aortic stenosis 424.1.  ------------------------------------------------------------------- History:   PMH:   Atrial fibrillation.  Congestive heart failure.   ------------------------------------------------------------------- Study Conclusions  - Left ventricle: The cavity size was normal. Wall thickness was   increased in a pattern of mild LVH. Systolic function was   moderately reduced. The estimated ejection fraction was in the   range of 35% to 40%. Wall motion was normal; there were no   regional wall motion abnormalities. - Aortic valve: There was critical stenosis. There was mild   regurgitation. Valve area (VTI): 0.37 cm^2. Valve area (Vmax):   0.38 cm^2. Valve area (Vmean): 0.36 cm^2. - Mitral valve: There was mild to moderate regurgitation. - Left atrium: The atrium was moderately to severely dilated. - Right atrium: The atrium was mildly dilated.  Impressions:  - Critical AS.   LVEF 35-40%  Recommendations:  Consider TAVR.  ------------------------------------------------------------------- Study data:  No prior study was  available for comparison.  Study status:  Routine.  Procedure:  The patient reported no pain pre or post test. Transthoracic echocardiography. Image quality was adequate.  Study completion:  There were no complications. Transthoracic echocardiography.  M-mode, complete 2D, spectral Doppler, and color Doppler.  Birthdate:  Patient birthdate: Sep 12, 1926.  Age:  Patient is 84 yr old.  Sex:  Gender: male. BMI: 22.9 kg/m^2.  Blood pressure:     138/86  Patient status: Outpatient.  Study date:  Study date: 02/25/2018. Study time: 03:25 PM.  Location:  Echo laboratory.  -------------------------------------------------------------------  ------------------------------------------------------------------- Left ventricle:  The cavity size was normal. Wall thickness was increased in a pattern of mild LVH. Systolic function was moderately reduced. The estimated ejection fraction was in the range of 35% to 40%. Wall motion was normal; there were no regional wall motion abnormalities.  ------------------------------------------------------------------- Aortic valve:   Trileaflet; moderately thickened, moderately calcified leaflets. Mobility was not restricted.  Doppler:   There was critical stenosis.   There was mild regurgitation.    VTI ratio of LVOT to aortic valve: 0.11. Valve area (VTI): 0.37 cm^2. Indexed valve area (VTI): 0.2 cm^2/m^2. Peak velocity ratio of LVOT to aortic valve: 0.11. Valve area (Vmax): 0.38 cm^2. Indexed valve area (Vmax): 0.21 cm^2/m^2. Mean velocity ratio of LVOT to aortic valve: 0.11. Valve area (Vmean): 0.36 cm^2. Indexed valve area (Vmean): 0.2 cm^2/m^2.    Mean gradient (S): 70 mm Hg. Peak gradient (S): 107 mm Hg.  ------------------------------------------------------------------- Aorta:  Aortic root: The aortic root was normal in size.  ------------------------------------------------------------------- Mitral valve:   Structurally normal valve.    Mobility was not restricted.  Doppler:  Transvalvular velocity was within the normal range. There was no evidence for stenosis. There was mild to moderate regurgitation.    Valve area by  pressure half-time: 4.23 cm^2. Indexed valve area by pressure half-time: 2.28 cm^2/m^2. Indexed valve area by continuity equation (using LVOT flow): 1 cm^2/m^2.    Mean gradient (D): 2 mm Hg. Peak gradient (D): 5 mm Hg.  ------------------------------------------------------------------- Left atrium:  The atrium was moderately to severely dilated.   ------------------------------------------------------------------- Right ventricle:  The cavity size was normal. Wall thickness was normal. Pacer wire or catheter noted in right ventricle. Systolic function was normal.  ------------------------------------------------------------------- Pulmonic valve:    Structurally normal valve.   Cusp separation was normal.  Doppler:  Transvalvular velocity was within the normal range. There was no evidence for stenosis. There was no regurgitation.  ------------------------------------------------------------------- Tricuspid valve:   Structurally normal valve.    Doppler: Transvalvular velocity was within the normal range. There was no regurgitation.  ------------------------------------------------------------------- Pulmonary artery:   The main pulmonary artery was normal-sized. Systolic pressure was within the normal range.  ------------------------------------------------------------------- Right atrium:  The atrium was mildly dilated. Pacer wire or catheter noted in right atrium.  ------------------------------------------------------------------- Pericardium:  There was no pericardial effusion.  ------------------------------------------------------------------- Systemic veins: Inferior vena cava: The vessel was normal in  size.  ------------------------------------------------------------------- Measurements   Left ventricle                           Value          Reference  LV ID, ED, PLAX chordal                  52    mm       43 - 52  LV ID, ES, PLAX chordal          (H)     43    mm       23 - 38  LV fx shortening, PLAX chordal   (L)     17    %        >=29  LV PW thickness, ED                      13    mm       ----------  IVS/LV PW ratio, ED                      1              <=1.3  Stroke volume, 2D                        48    ml       ----------  Stroke volume/bsa, 2D                    26    ml/m^2   ----------  LV ejection fraction, 1-p A4C            41    %        ----------  LV end-diastolic volume, 2-p             122   ml       ----------  LV end-systolic volume, 2-p              67    ml       ----------  LV ejection fraction, 2-p                45    %        ----------  Stroke volume, 2-p                       55    ml       ----------  LV end-diastolic volume/bsa, 2-p         66    ml/m^2   ----------  LV end-systolic volume/bsa, 2-p          36    ml/m^2   ----------  Stroke volume/bsa, 2-p                   29.9  ml/m^2   ----------  LV e&', lateral                           6.31  cm/s     ----------  LV E/e&', lateral                         18.38          ----------  LV e&', medial                            4.99  cm/s     ----------  LV E/e&', medial                          23.25          ----------  LV e&', average                           5.65  cm/s     ----------  LV E/e&', average                         20.53          ----------    Ventricular septum                       Value          Reference  IVS thickness, ED                        13    mm       ----------    LVOT                                     Value          Reference  LVOT ID, S                               21    mm       ----------  LVOT area                                3.46  cm^2      ----------  LVOT peak velocity, S                    57.4  cm/s     ----------  LVOT mean velocity, S  40.8  cm/s     ----------  LVOT VTI, S                              13.9  cm       ----------  LVOT peak gradient, S                    1     mm Hg    ----------    Aortic valve                             Value          Reference  Aortic valve peak velocity, S            517   cm/s     ----------  Aortic valve mean velocity, S            388   cm/s     ----------  Aortic valve VTI, S                      131   cm       ----------  Aortic mean gradient, S                  70    mm Hg    ----------  Aortic peak gradient, S                  107   mm Hg    ----------  VTI ratio, LVOT/AV                       0.11           ----------  Aortic valve area, VTI                   0.37  cm^2     ----------  Aortic valve area/bsa, VTI               0.2   cm^2/m^2 ----------  Velocity ratio, peak, LVOT/AV            0.11           ----------  Aortic valve area, peak velocity         0.38  cm^2     ----------  Aortic valve area/bsa, peak              0.21  cm^2/m^2 ----------  velocity  Velocity ratio, mean, LVOT/AV            0.11           ----------  Aortic valve area, mean velocity         0.36  cm^2     ----------  Aortic valve area/bsa, mean              0.2   cm^2/m^2 ----------  velocity  Aortic regurg pressure half-time         415   ms       ----------    Aorta                                    Value          Reference  Aortic  root ID, ED                       37    mm       ----------  Ascending aorta ID, A-P, S               32    mm       ----------    Left atrium                              Value          Reference  LA ID, A-P, ES                           50    mm       ----------  LA ID/bsa, A-P                   (H)     2.7   cm/m^2   <=2.2  LA volume, S                             123   ml       ----------  LA volume/bsa, S                         66.4   ml/m^2   ----------  LA volume, ES, 1-p A4C                   88.4  ml       ----------  LA volume/bsa, ES, 1-p A4C               47.7  ml/m^2   ----------  LA volume, ES, 1-p A2C                   176   ml       ----------  LA volume/bsa, ES, 1-p A2C               95    ml/m^2   ----------    Mitral valve                             Value          Reference  Mitral E-wave peak velocity              116   cm/s     ----------  Mitral mean velocity, D                  57.5  cm/s     ----------  Mitral deceleration time                 176   ms       150 - 230  Mitral pressure half-time                52    ms       ----------  Mitral mean gradient, D                  2     mm Hg    ----------  Mitral peak gradient, D  5     mm Hg    ----------  Mitral valve area, PHT, DP               4.23  cm^2     ----------  Mitral valve area/bsa, PHT, DP           2.28  cm^2/m^2 ----------  Mitral valve area/bsa, LVOT              1     cm^2/m^2 ----------  continuity  Mitral annulus VTI, D                    26    cm       ----------    Tricuspid valve                          Value          Reference  Tricuspid regurg peak velocity           293   cm/s     ----------  Tricuspid peak RV-RA gradient            34    mm Hg    ----------    Right atrium                             Value          Reference  RA ID, S-I, ES, A4C              (H)     63.1  mm       34 - 49  RA area, ES, A4C                 (H)     22.9  cm^2     8.3 - 19.5  RA volume, ES, A/L                       74    ml       ----------  RA volume/bsa, ES, A/L                   39.9  ml/m^2   ----------    Right ventricle                          Value          Reference  TAPSE                                    15.5  mm       ----------  RV s&', lateral, S                        7.8   cm/s     ----------  Legend: (L)  and  (H)  mark values outside specified reference  range.  ------------------------------------------------------------------- Prepared and Electronically Authenticated by  Jenne Campus, MD 2019-08-19T16:31:04    RIGHT/LEFT HEART CATH AND CORONARY ANGIOGRAPHY  Conclusion     Prox LAD lesion is 20% stenosed.  There is severe aortic valve stenosis.  Hemodynamic findings consistent with moderate pulmonary hypertension.   1. Mild non-obstructive CAD 2. Severe aortic stenosis (peak to peak gradient 62  mmHg, mean gradient 49.9 mmHg, AVA 0.72 cm2). The valve was crossed with an AL-2 catheter.   Recommendations: Will continue workup for TAVR. He will need to use Lasix.     Indications   Severe aortic stenosis [I35.0 (ICD-10-CM)]  Procedural Details/Technique   Technical Details Indication: 84 yo male with severe aortic stenosis, recent dizziness and dyspnea.   Procedure: The risks, benefits, complications, treatment options, and expected outcomes were discussed with the patient. The patient and/or family concurred with the proposed plan, giving informed consent. The patient was brought to the cath lab after IV hydration was given. The patient was sedated with Versed and Fentanyl. The right groin was prepped and draped. I placed a 7 French sheath in the right femoral vein under u/s guidance. Images saved and stored in the patients chart. Right heart catheterization performed with a balloon tipped catheter. The right wrist was prepped and draped in a sterile fashion. 1% lidocaine was used for local anesthesia. Using the modified Seldinger access technique, a 5 French sheath was placed in the right radial artery. 3 mg Verapamil was given through the sheath. 3500 units IV heparin was given. Standard diagnostic catheters were used to perform selective coronary angiography. I engaged the left main with an AL-1 catheter. I crossed the aortic valve with an AL-1 catheter and a straight wire. No LV gram performed. The sheath was removed from  the right radial artery and a Terumo hemostasis band was applied at the arteriotomy site on the right wrist.     Estimated blood loss <50 mL.  During this procedure the patient was administered the following to achieve and maintain moderate conscious sedation: Versed 1 mg, Fentanyl 25 mcg, while the patient's heart rate, blood pressure, and oxygen saturation were continuously monitored. The period of conscious sedation was 27 minutes, of which I was present face-to-face 100% of this time.  Complications   Complications documented before study signed (03/06/2018 12:44 PM EDT)    RIGHT/LEFT HEART CATH AND CORONARY ANGIOGRAPHY   None Documented by Burnell Blanks, MD 03/06/2018 12:30 PM EDT  Time Range: Intraprocedure      Coronary Findings   Diagnostic  Dominance: Right  Left Anterior Descending  Vessel is large.  Prox LAD lesion 20% stenosed  Prox LAD lesion is 20% stenosed.  Left Circumflex  First Obtuse Marginal Branch  Vessel is angiographically normal.  Right Coronary Artery  Vessel is large. Vessel is angiographically normal.  Intervention   No interventions have been documented.  Right Heart   Right Heart Pressures Hemodynamic findings consistent with moderate pulmonary hypertension. Elevated LV EDP consistent with volume overload.  Left Heart   Aortic Valve There is severe aortic valve stenosis. The aortic valve is calcified.  Coronary Diagrams   Diagnostic Diagram       Implants    No implant documentation for this case.  MERGE Images   Show images for CARDIAC CATHETERIZATION   Link to Procedure Log   Procedure Log    Hemo Data    Most Recent Value  Fick Cardiac Output 3.35 L/min  Fick Cardiac Output Index 1.8 (L/min)/BSA  Aortic Mean Gradient 49.9 mmHg  Aortic Peak Gradient 62 mmHg  Aortic Valve Area 0.72  Aortic Value Area Index 0.39 cm2/BSA  RA A Wave 16 mmHg  RA V Wave 17 mmHg  RA Mean 15 mmHg  RV Systolic Pressure 63 mmHg  RV  Diastolic Pressure 8 mmHg  RV EDP 17 mmHg  PA Systolic Pressure 61  mmHg  PA Diastolic Pressure 29 mmHg  PA Mean 40 mmHg  PW A Wave 26 mmHg  PW V Wave 26 mmHg  PW Mean 26 mmHg  AO Systolic Pressure 938 mmHg  AO Diastolic Pressure 65 mmHg  AO Mean 91 mmHg  LV Systolic Pressure 182 mmHg  LV Diastolic Pressure 16 mmHg  LV EDP 22 mmHg  AOp Systolic Pressure 993 mmHg  AOp Diastolic Pressure 64 mmHg  AOp Mean Pressure 93 mmHg  LVp Systolic Pressure 716 mmHg  LVp Diastolic Pressure 15 mmHg  LVp EDP Pressure 25 mmHg  QP/QS 1  TPVR Index 22.25 HRUI  TSVR Index 50.61 HRUI  PVR SVR Ratio 0.18  TPVR/TSVR Ratio 0.44    Cardiac TAVR CT  TECHNIQUE: The patient was scanned on a [employer]. A 120 kV retrospective scan was triggered in the descending thoracic aorta at 111 HU's. Gantry rotation speed was 250 msecs and collimation was .6 mm. No beta blockade or nitro were given. The 3D data set was reconstructed in 5% intervals of the R-R cycle. Systolic and diastolic phases were analyzed on a dedicated work station using MPR, MIP and VRT modes. The patient received 80 cc of contrast.  FINDINGS: Aortic Valve: Trileaflet aortic valve with severely thickened and calcified leaflets and severely restricted leaflet opening. There are no calcifications extending into the LVOT.  Aorta: Normal size with mild to moderate diffuse calcifications and atherosclerotic plaque. No dissection.  Sinotubular Junction: 28 x 26 mm  Ascending Thoracic Aorta: 30 x 28 mm  Aortic Arch: 24 x 24 mm  Descending Thoracic Aorta: 24 x 23 mm  Sinus of Valsalva Measurements:  Non-coronary: 35 mm  Right -coronary: 33 mm  Left -coronary: 36 mm  Coronary Artery Height above Annulus:  Left Main: 16 mm  Right Coronary: 22 mm  Virtual Basal Annulus Measurements:  Maximum/Minimum Diameter: 32.0 x 25.5 mm  Mean Diameter: 28.3 mm  Perimeter: 90.3 mm  Area: 628  mm2  Optimum Fluoroscopic Angle for Delivery: LAO 5 CAU 8  IMPRESSION: 1. Trileaflet aortic valve with severely thickened and calcified leaflets and severely restricted leaflet opening and no calcifications extending into the LVOT. Annular measurements suitable for delivery of a 29 mm Edwards-SAPIEN 3 valve.  2. Sufficient coronary to annulus distance.  3. Optimum Fluoroscopic Angle for Delivery:  LAO 5 CAU 8  4. The left atrial appendage is very large with a filling defect at the very superior portion. This doesn't completely clear on the delayed imaging but streaks of contrast suggest that this is most probably artifact (not a thrombus).  Ena Dawley   Electronically Signed   By: Ena Dawley   On: 03/24/2018 21:18   CT ANGIOGRAPHY CHEST, ABDOMEN AND PELVIS  TECHNIQUE: Multidetector CT imaging through the chest, abdomen and pelvis was performed using the standard protocol during bolus administration of intravenous contrast. Multiplanar reconstructed images and MIPs were obtained and reviewed to evaluate the vascular anatomy.  CONTRAST:  155mL ISOVUE-370 IOPAMIDOL (ISOVUE-370) INJECTION 76%  COMPARISON:  Chest CT 06/23/2008. CT the abdomen and pelvis 10/01/2009.  FINDINGS: CTA CHEST FINDINGS  Cardiovascular: Heart size is enlarged with left atrial dilatation. Filling defect in the tip of the left atrial appendage, concerning for potential thrombus. There is no significant pericardial fluid, thickening or pericardial calcification. There is aortic atherosclerosis, as well as atherosclerosis of the great vessels of the mediastinum and the coronary arteries, including calcified atherosclerotic plaque in the left main and left anterior descending coronary  arteries. Left-sided biventricular pacemaker device in place with lead tips terminating in the right atrium, right ventricle and overlying the lateral wall the left ventricle via the coronary  sinus and coronary veins.  Mediastinum/Lymph Nodes: No pathologically enlarged mediastinal or hilar lymph nodes. Esophagus is unremarkable in appearance. No axillary lymphadenopathy.  Lungs/Pleura: Trace bilateral pleural effusions (right greater than left). Mild diffuse ground-glass attenuation and interlobular septal thickening, suggesting a background of mild interstitial pulmonary edema. No acute consolidative airspace disease. No suspicious appearing pulmonary nodules or masses are noted.  Musculoskeletal/Soft Tissues: Old healed fracture of right clavicle with posttraumatic deformity. There are no aggressive appearing lytic or blastic lesions noted in the visualized portions of the skeleton.  CTA ABDOMEN AND PELVIS FINDINGS  Hepatobiliary: Liver has a shrunken appearance and nodular contour, suggesting underlying cirrhosis. No definite cystic or solid hepatic lesions. No intra or extrahepatic biliary ductal dilatation. Gallbladder is normal in appearance.  Pancreas: Previously noted low-attenuation lesions in the head of the pancreas and tail of the pancreas appear slightly larger than the prior examination. The small lesion in the head of the pancreas measures 1.5 x 1.2 cm (axial image 120 of series 6). The larger lesion in the tail of the pancreas measures 5.6 x 5.2 x 5.6 cm (axial image 107 of series 6 and coronal image 74 of series 8). No pancreatic ductal dilatation. No peripancreatic inflammatory changes.  Spleen: Unremarkable.  Adrenals/Urinary Tract: Multiple low-attenuation lesions in both kidneys, largest of which is in the interpolar region of the right kidney measuring 3.6 cm in diameter. No suspicious renal lesions. Bilateral adrenal glands are normal in appearance. No hydroureteronephrosis. Urinary bladder is normal in appearance.  Stomach/Bowel: Normal appearance of the stomach. No pathologic dilatation of small bowel or colon. The appendix is  not confidently identified and may be surgically absent. Regardless, there are no inflammatory changes noted adjacent to the cecum to suggest the presence of an acute appendicitis at this time.  Vascular/Lymphatic: Aortic atherosclerosis, with vascular findings and measurements pertinent to potential TAVR procedure, as detailed below. No aneurysm or dissection noted in the abdominal or pelvic vasculature. No lymphadenopathy noted in the abdomen or pelvis.  Reproductive: Prostate gland and seminal vesicles are unremarkable in appearance.  Other: No significant volume of ascites.  No pneumoperitoneum.  Musculoskeletal: There are no aggressive appearing lytic or blastic lesions noted in the visualized portions of the skeleton.  VASCULAR MEASUREMENTS PERTINENT TO TAVR:  AORTA:  Minimal Aortic Diameter - 15 x 9 mm  Severity of Aortic Calcification-moderate  RIGHT PELVIS:  Right Common Iliac Artery -  Minimal Diameter-8.6 x 8.7 mm  Tortuosity-severe  Calcification-mild  Right External Iliac Artery -  Minimal Diameter-8.0 x 7.4 mm  Tortuosity-moderate  Calcification-none  Right Common Femoral Artery -  Minimal Diameter-7.2 x 7.5 mm  Tortuosity-mild  Calcification-mild  LEFT PELVIS:  Left Common Iliac Artery -  Minimal Diameter-8.2 x 6.9 mm  Tortuosity-severe  Calcification-mild  Left External Iliac Artery -  Minimal Diameter-7.9 x 7.5 mm  Tortuosity-moderate  Calcification-none  Left Common Femoral Artery -  Minimal Diameter-7.9 x 6.9 mm  Tortuosity-mild  Calcification-mild  Review of the MIP images confirms the above findings.  IMPRESSION: 1. Vascular findings and measurements pertinent to potential TAVR procedure, as detailed above. 2. Thickening calcification of the aortic valve, compatible with the reported clinical history of severe aortic stenosis. 3. Filling defect in the left atrial appendage,  concerning for potential left atrial appendage thrombus. While this could simply reflect "  pseudo thrombus" related to incomplete mixing of contrast material, attention to this region at time of future transesophageal echocardiography is recommended, as this places the patient at risk for potential systemic embolization. 4. Cardiomegaly with evidence of mild interstitial pulmonary edema and trace bilateral pleural effusions; imaging findings suggestive of underlying congestive heart failure. 5. Shrunken appearance and nodular contour of the liver suggesting early changes of cirrhosis. No suspicious hepatic lesions are noted at this time. 6. Additional incidental findings, as above.   Electronically Signed   By: Vinnie Langton M.D.   On: 03/22/2018 09:50    STS Risk Calculator  Procedure: Isolated AVR   Risk of Mortality: 5.095%  Renal Failure: 6.229%  Permanent Stroke: 2.216%  Prolonged Ventilation: 16.733%  DSW Infection: 0.076%  Reoperation: 5.559%  Morbidity or Mortality:26.856%  Short Length of Stay: 16.161%  Long Length of Stay: 14.321%     Impression:  Patient has stage D severe symptomatic aortic stenosis.  He describes progressive symptoms of exertional shortness of breath consistent with chronic diastolic congestive heart failure, New York Heart Association functional class II.  Most worrisome is the development of dizzy spells that occur following exercise.  He has not had any frank syncopal episodes.  I have personally reviewed the patient's recent transthoracic echocardiogram, diagnostic cardiac catheterization, and CT angiograms.  Echocardiogram confirmed the presence of severe aortic stenosis with moderate global left ventricular systolic dysfunction.  Peak velocity across aortic valve measured greater than 5 m/s and mean transvalvular gradient was measured close to 50 mmHg at catheterization despite the presence of significant left ventricular systolic  dysfunction.  There is no question the patient needs aortic valve replacement.  Coronary angiography was notable for the absence of significant coronary artery disease.  The patient did have significant calcification in the aortic root which would likely dramatically increased risks associated with conventional surgery.  I would be very reluctant to consider this elderly patient candidate for conventional surgery.  Cardiac-gated CTA of the heart reveals anatomical characteristics consistent with aortic stenosis suitable for treatment by transcatheter aortic valve replacement without any significant complicating features and CTA of the aorta and iliac vessels demonstrate what appears to be adequate pelvic vascular access to facilitate a transfemoral approach, although the patient's common iliac arteries are somewhat tortuous.    Plan:  The patient and one of his grandsons were counseled at length regarding treatment alternatives for management of severe symptomatic aortic stenosis. Alternative approaches such as conventional aortic valve replacement, transcatheter aortic valve replacement, and continued medical therapy without intervention were compared and contrasted at length.  The risks associated with conventional surgical aortic valve replacement were discussed in detail, as were expectations for post-operative convalescence, and why I would be reluctant to consider this patient a candidate for conventional surgery.  Issues specific to transcatheter aortic valve replacement were discussed including questions about long term valve durability, the potential for paravalvular leak, possible increased risk of need for permanent pacemaker placement, and other technical complications related to the procedure itself.  Long-term prognosis with medical therapy was discussed. This discussion was placed in the context of the patient's own specific clinical presentation and past medical history.  All of their questions  have been addressed.  The patient hopes to proceed with transcatheter aortic valve replacement in the near future.  We tentatively plan for surgery on April 09, 2018.  Patient has been instructed to stop taking Coumadin 5 days prior to surgery.  Following the decision to proceed with transcatheter aortic  valve replacement, a discussion has been held regarding what types of management strategies would be attempted intraoperatively in the event of life-threatening complications, including whether or not the patient would be considered a candidate for the use of cardiopulmonary bypass and/or conversion to open sternotomy for attempted surgical intervention.  The patient has been advised of a variety of complications that might develop including but not limited to risks of death, stroke, paravalvular leak, aortic dissection or other major vascular complications, aortic annulus rupture, device embolization, cardiac rupture or perforation, mitral regurgitation, acute myocardial infarction, arrhythmia, heart block or bradycardia requiring permanent pacemaker placement, congestive heart failure, respiratory failure, renal failure, pneumonia, infection, other late complications related to structural valve deterioration or migration, or other complications that might ultimately cause a temporary or permanent loss of functional independence or other long term morbidity.  The patient provides full informed consent for the procedure as described and all questions were answered.    I spent in excess of 90 minutes during the conduct of this office consultation and >50% of this time involved direct face-to-face encounter with the patient for counseling and/or coordination of their care.    Valentina Gu. Roxy Manns, MD 03/25/2018 12:23 PM

## 2018-03-25 NOTE — Therapy (Addendum)
[place], [place], [address] Phone: [phone]   Fax:  [phone]  Physical Therapy Evaluation  Patient Details  Name: [name] [name] MRN: [id number] Date of Birth: [birth year] Referring Provider: Angelena Form [facility]    Encounter Date: 03/25/2018  PT End of Session - 03/25/18 1151    Visit Number  1    Number of Visits  1    Date for PT Re-Evaluation  03/25/18    PT Start Time  1103    PT Stop Time  1133    PT Time Calculation (min)  30 min    Activity Tolerance  Patient tolerated treatment well    Behavior During Therapy  [facility] for tasks assessed/performed       Past Medical History:  Diagnosis Date  . Atrial fibrillation (HCC)    echo- EF 35-40%; LA severely dilated; RA mod dilated, RV systolic pressure 62GBTD; LV systolic fcn mod reduced  . Diabetes mellitus without complication ([place])   . Severe aortic stenosis   . SSS (sick sinus syndrome) ([place])    pacemaker placed in 1993    Past Surgical History:  Procedure Laterality Date  . AV NODE ABLATION Bilateral 07/04/2013   Procedure: AV NODE ABLATION;  Surgeon: Evans Lance, MD;  Location: [facility] CATH LAB;  Service: Cardiovascular;  Laterality: Bilateral;  . BI-VENTRICULAR PACEMAKER INSERTION Bilateral 07/04/2013   Procedure: BI-VENTRICULAR PACEMAKER INSERTION (CRT-P);  Surgeon: Evans Lance, MD;  Location: [facility] CATH LAB;  Service: Cardiovascular;  Laterality: Bilateral;  . BIV PACEMAKER GENERATOR CHANGE OUT  11/2004  . HERNIA REPAIR    . LEFT HEART CATHETERIZATION WITH CORONARY ANGIOGRAM N/A 07/07/2013   Procedure: LEFT HEART CATHETERIZATION WITH CORONARY ANGIOGRAM;  Surgeon: Pixie Casino, MD;  Location: [facility] CATH LAB;  Service: Cardiovascular;  Laterality: N/A;  . PACEMAKER INSERTION  1993  . RIGHT/LEFT HEART CATH AND CORONARY ANGIOGRAPHY N/A 03/06/2018   Procedure: RIGHT/LEFT HEART CATH AND CORONARY ANGIOGRAPHY;  Surgeon: Burnell Blanks,  MD;  Location: [place] CV LAB;  Service: Cardiovascular;  Laterality: N/A;    There were no vitals filed for this visit.   Subjective Assessment - 03/25/18 1105    Subjective  Patient reports that over the past 6 months he has had some shortness of breath with ambualktion. He has remianed active but has had some difficulty perfroming his daily tasks.     Currently in Pain?  No/denies         [facility] PT Assessment - 03/25/18 0001      Assessment   Medical Diagnosis  Severe Aortic Stensois    Referring Provider  Angelena Form [facility]     Onset Date/Surgical Date  --   6 months    Hand Dominance  Right    Next MD Visit  today       Precautions   Precautions  ICD/Pacemaker      Balance Screen   Has the patient fallen in the past 6 months  No    Has the patient had a decrease in activity level because of a fear of falling?   No    Is the patient reluctant to leave their home because of a fear of falling?   No      Home Environment   Additional Comments  No steps in the house       Prior Function   Level of Independence  Independent    Vocation  Retired  Leisure  Nothing       Cognition   Overall Cognitive Status  Within Functional Limits for tasks assessed    Attention  Focused    Focused Attention  Appears intact    Memory  Appears intact    Awareness  Appears intact    Problem Solving  Appears intact      Sensation   Light Touch  Appears Intact      ROM / Strength   AROM / PROM / Strength  AROM;Strength      AROM   Overall AROM Comments  all active ROM WFL      Strength   Overall Strength Comments  overall gross ue and LE sytrength 5/5     Strength Assessment Site  Hand    Right/Left hand  Right;Left    Right Hand Grip (lbs)  60     Left Hand Grip (lbs)  60       [facility] Pre-Surgical Assessment - 03/25/18 0001    5 Meter Walk Test- trial 1  7 sec    5 Meter Walk Test- trial 2  6 sec.     5 Meter Walk Test- trial 3  7 sec.    5 meter walk test average   6.67 sec    4 Stage Balance Test Position  3    comment  unable to stand on 1 foot     Sit To Stand Test- trial 1  2 sec.    Comment  was able to do 2x but his knees would not allow him to do more     ADL/IADL Independent with:  Bathing;Dressing;Meal prep;Finances;Yard work    6 Minute Walk- Baseline  yes    BP (mmHg)  153/90    HR (bpm)  103    02 Sat (%RA)  88 %    Modified Borg Scale for Dyspnea  0- Nothing at all    Perceived Rate of Exertion (Borg)  9- very light    6 Minute Walk Post Test  yes    BP (mmHg)  143/87    HR (bpm)  73    02 Sat (%RA)  91 %    Modified Borg Scale for Dyspnea  0- Nothing at all    Perceived Rate of Exertion (Borg)  6-    Aerobic Endurance Distance Walked  1030    Endurance additional comments  did not require a seated rest break               Objective measurements completed on examination: See above findings.                           Plan - 03/25/18 1252    Clinical Impression Statement  see below     History and Personal Factors relevant to plan of care:  Pacemaker; a-fib, severe aortic stenosis     Clinical Presentation  Stable    Clinical Decision Making  Low    Rehab Potential  Good    PT Frequency  One time visit    Consulted and Agree with Plan of Care  Patient      Clinical Impression Statement: Pt is an 84 yo male presenting to OP PT for evaluation prior to possible TAVR surgery due to severe aortic stenosis. Pt reports onset of dyspnea approximately 6 months ago. Symptoms are limiting ability to perform ADL's. Pt presents with normal ROM and strength, decreased balance and  is not at high fall risk 4 stage balance test, decrease walking speed and fair aerobic endurance per 6 minute walk test. Pt ambulated 1030 feet in 6 min. At the end of the 6 min walk his HR was 103 bpm and O2 was 88 on room air. Pt reported 0/10 shortness of breath on modified scale for dyspnea. HR and B/P increased significantly with 6  minute walk test. Based on the Short Physical Performance Battery, patient has a frailty rating of 6/12 with </= 5/12 considered frail.    Patient will benefit from skilled therapeutic intervention in order to improve the following deficits and impairments:     Visit Diagnosis: Other abnormalities of gait and mobility - Plan: PT plan of care cert/re-cert     Problem List Patient Active Problem List   Diagnosis Date Noted  . Severe aortic stenosis   . Cardiomyopathy ([place]) 07/07/2013  . Parkinson's disease ([place]) 07/07/2013  . Permanent atrial fibrillation- AVN RFA 07/05/13 06/29/2013  . Chronic combined systolic and diastolic heart failure ([place]) 06/29/2013  . Biventricular cardiac pacemaker upgrade 07/05/13 ([employer] Jude) 06/29/2013  . Critical aortic valve stenosis 06/29/2013  . Current use of long term anticoagulation 06/29/2013  . Diabetes mellitus without complication ([place])     Carney Living PT DPT  03/25/2018, 1:17 PM  [facility] [address] [place], [place], [address] Phone: [phone]   Fax:  [phone]  Name: [name] [name] MRN: [id number] Date of Birth: 1926/11/22

## 2018-04-03 ENCOUNTER — Ambulatory Visit: Payer: Medicare Other | Admitting: Cardiology

## 2018-04-05 ENCOUNTER — Encounter (HOSPITAL_COMMUNITY)
Admission: RE | Admit: 2018-04-05 | Discharge: 2018-04-05 | Disposition: A | Discharge status: Home / Self Care | Payer: Medicare Other | Source: Ambulatory Visit | Attending: Cardiovascular Disease | Admitting: Cardiovascular Disease

## 2018-04-05 ENCOUNTER — Other Ambulatory Visit: Payer: Self-pay

## 2018-04-05 ENCOUNTER — Encounter (HOSPITAL_COMMUNITY): Payer: Self-pay

## 2018-04-05 ENCOUNTER — Ambulatory Visit (HOSPITAL_COMMUNITY)
Admission: RE | Admit: 2018-04-05 | Discharge: 2018-04-05 | Disposition: A | Discharge status: Home / Self Care | Payer: Medicare Other | Source: Ambulatory Visit | Attending: Cardiovascular Disease | Admitting: Cardiovascular Disease

## 2018-04-05 DIAGNOSIS — R9431 Abnormal electrocardiogram [ECG] [EKG]: Secondary | ICD-10-CM | POA: Insufficient documentation

## 2018-04-05 DIAGNOSIS — Z01818 Encounter for other preprocedural examination: Secondary | ICD-10-CM | POA: Insufficient documentation

## 2018-04-05 DIAGNOSIS — I350 Nonrheumatic aortic (valve) stenosis: Secondary | ICD-10-CM

## 2018-04-05 HISTORY — DX: Malignant (primary) neoplasm, unspecified: C80.1

## 2018-04-05 HISTORY — DX: Heart failure, unspecified: I50.9

## 2018-04-05 HISTORY — DX: Tremor, unspecified: R25.1

## 2018-04-05 HISTORY — DX: Presence of cardiac pacemaker: Z95.0

## 2018-04-05 HISTORY — DX: Essential (primary) hypertension: I10

## 2018-04-05 LAB — TYPE AND SCREEN
ABO/RH(D): A POS
Antibody Screen: NEGATIVE

## 2018-04-05 LAB — BRAIN NATRIURETIC PEPTIDE: B NATRIURETIC PEPTIDE 5: 1303.3 pg/mL — AB (ref 0.0–100.0)

## 2018-04-05 LAB — BLOOD GAS, ARTERIAL
Acid-Base Excess: 2 mmol/L (ref 0.0–2.0)
Bicarbonate: 26.2 mmol/L (ref 20.0–28.0)
DRAWN BY: 470591
FIO2: 21
O2 SAT: 99.2 %
PCO2 ART: 41.6 mmHg (ref 32.0–48.0)
Patient temperature: 98.6
pH, Arterial: 7.415 (ref 7.350–7.450)
pO2, Arterial: 153 mmHg — ABNORMAL HIGH (ref 83.0–108.0)

## 2018-04-05 LAB — URINALYSIS, ROUTINE W REFLEX MICROSCOPIC
Bilirubin Urine: NEGATIVE
GLUCOSE, UA: NEGATIVE mg/dL
Hgb urine dipstick: NEGATIVE
KETONES UR: 5 mg/dL — AB
LEUKOCYTES UA: NEGATIVE
NITRITE: NEGATIVE
PH: 5 (ref 5.0–8.0)
Protein, ur: NEGATIVE mg/dL
Specific Gravity, Urine: 1.019 (ref 1.005–1.030)

## 2018-04-05 LAB — CBC
HEMATOCRIT: 45.4 % (ref 39.0–52.0)
Hemoglobin: 14 g/dL (ref 13.0–17.0)
MCH: 29.7 pg (ref 26.0–34.0)
MCHC: 30.8 g/dL (ref 30.0–36.0)
MCV: 96.2 fL (ref 78.0–100.0)
Platelets: 127 10*3/uL — ABNORMAL LOW (ref 150–400)
RBC: 4.72 MIL/uL (ref 4.22–5.81)
RDW: 14.8 % (ref 11.5–15.5)
WBC: 8.2 10*3/uL (ref 4.0–10.5)

## 2018-04-05 LAB — HEMOGLOBIN A1C
HEMOGLOBIN A1C: 6 % — AB (ref 4.8–5.6)
Mean Plasma Glucose: 125.5 mg/dL

## 2018-04-05 LAB — SURGICAL PCR SCREEN
MRSA, PCR: NEGATIVE
STAPHYLOCOCCUS AUREUS: NEGATIVE

## 2018-04-05 LAB — COMPREHENSIVE METABOLIC PANEL
ALBUMIN: 3.7 g/dL (ref 3.5–5.0)
ALK PHOS: 49 U/L (ref 38–126)
ALT: <5 U/L (ref 0–44)
ANION GAP: 8 (ref 5–15)
AST: 27 U/L (ref 15–41)
BILIRUBIN TOTAL: 1.4 mg/dL — AB (ref 0.3–1.2)
BUN: 30 mg/dL — ABNORMAL HIGH (ref 8–23)
CALCIUM: 9 mg/dL (ref 8.9–10.3)
CO2: 24 mmol/L (ref 22–32)
Chloride: 109 mmol/L (ref 98–111)
Creatinine, Ser: 1.47 mg/dL — ABNORMAL HIGH (ref 0.61–1.24)
GFR calc Af Amer: 47 mL/min — ABNORMAL LOW (ref >60)
GFR calc non Af Amer: 40 mL/min — ABNORMAL LOW (ref >60)
Glucose, Bld: 106 mg/dL — ABNORMAL HIGH (ref 70–99)
POTASSIUM: 4.3 mmol/L (ref 3.5–5.1)
Sodium: 141 mmol/L (ref 135–145)
TOTAL PROTEIN: 7 g/dL (ref 6.5–8.1)

## 2018-04-05 LAB — APTT: aPTT: 39 seconds — ABNORMAL HIGH (ref 24–36)

## 2018-04-05 LAB — GLUCOSE, CAPILLARY: Glucose-Capillary: 99 mg/dL (ref 70–99)

## 2018-04-05 LAB — PROTIME-INR
INR: 1.88
PROTHROMBIN TIME: 21.4 s — AB (ref 11.4–15.2)

## 2018-04-05 LAB — ABO/RH: ABO/RH(D): A POS

## 2018-04-05 NOTE — Progress Notes (Signed)
Pt has hx of A-fib, Sick Sinus syndrome and Aortic stenosis. Pt has a pacemaker. Pt's cardiologist is Dr. Sallyanne Kuster. Pt denies any recent chest pain, he does get sob with exertion. Pt is on Coumadin, his last dose was 04/03/18 in the evening. Pt is a type 2 diabetic. He thinks his last A1C was 5.9 a month and half ago. He states his fasting blood sugar is usually between 90-100.

## 2018-04-05 NOTE — Pre-Procedure Instructions (Signed)
[name] [name]  04/05/2018    Your procedure is scheduled on Tuesday, April 09, 2018 at 10:00 AM.   Report to [facility] Entrance "A" Admitting Office at 7:45 AM.   Call this number if you have problems the morning of surgery: [phone]   Questions prior to day of surgery, please call [phone] between 8 & 4 PM.   Remember:  Do not eat or drink after midnight Monday, 04/08/18.  Take these medicines the morning of surgery with A SIP OF WATER: Carbidopa-Levodopa (Sinemet)  Do not take Tradjenta (Linagliptin) day of surgery.   Stop Fish Oil, Vitamin E and Multivitamins as of today.  You were instructed to stop Coumadin (Warfarin) on 04/04/18.   How to Manage Your Diabetes Before Surgery   Why is it important to control my blood sugar before and after surgery?   Improving blood sugar levels before and after surgery helps healing and can limit problems.  A way of improving blood sugar control is eating a healthy diet by:  - Eating less sugar and carbohydrates  - Increasing activity/exercise  - Talk with your doctor about reaching your blood sugar goals  High blood sugars (greater than 180 mg/dL) can raise your risk of infections and slow down your recovery so you will need to focus on controlling your diabetes during the weeks before surgery.  Make sure that the doctor who takes care of your diabetes knows about your planned surgery including the date and location.  How do I manage my blood sugars before surgery?   Check your blood sugar at least 4 times a day, 2 days before surgery to make sure that they are not too high or low.  Check your blood sugar the morning of your surgery when you wake up and every 2 hours until you get to the Short-Stay unit.  Treat a low blood sugar (less than 70 mg/dL) with 1/2 cup of clear juice (cranberry or apple), 4 glucose tablets, OR glucose gel.  Recheck blood sugar in 15 minutes after treatment (to make sure it is greater  than 70 mg/dL).  If blood sugar is not greater than 70 mg/dL on re-check, call [phone] for further instructions.   Report your blood sugar to the Short-Stay nurse when you get to Short-Stay.  References:  [employer] [facility], 2007 "How to Manage your Diabetes Before and After Surgery".    Do not wear jewelry.  Do not wear lotions, powders, cologne or deodorant.  Men may shave face and neck.  Do not bring valuables to the hospital.  [facility] is not responsible for any belongings or valuables.  Contacts, dentures or bridgework may not be worn into surgery.  Leave your suitcase in the car.  After surgery it may be brought to your room.  For patients admitted to the hospital, discharge time will be determined by your treatment team.  [facility] - Preparing for Surgery  Before surgery, you can play an important role.  Because skin is not sterile, your skin needs to be as free of germs as possible.  You can reduce the number of germs on you skin by washing with CHG (chlorahexidine gluconate) soap before surgery.  CHG is an antiseptic cleaner which kills germs and bonds with the skin to continue killing germs even after washing.  Oral Hygiene is also important in reducing the risk of infection.  Remember to brush your teeth with your regular toothpaste the morning of surgery.  Please DO NOT use if you have an allergy to CHG or antibacterial soaps.  If your skin becomes reddened/irritated stop using the CHG and inform your nurse when you arrive at Short Stay.  Do not shave (including legs and underarms) for at least 48 hours prior to the first CHG shower.  You may shave your face.  Please follow these instructions carefully:   1.  Shower with CHG Soap the night before surgery and the morning of Surgery.  2.  If you choose to wash your hair, wash your hair first as usual with your normal shampoo.  3.  After you shampoo, rinse your hair and body thoroughly to remove the  shampoo. 4.  Use CHG as you would any other liquid soap.  You can apply chg directly to the skin and wash gently with a      scrungie or washcloth.           5.  Apply the CHG Soap to your body ONLY FROM THE NECK DOWN.   Do not use on open wounds or open sores. Avoid contact with your eyes, ears, mouth and genitals (private parts).  Wash genitals (private parts) with your normal soap.  6.  Wash thoroughly, paying special attention to the area where your surgery will be performed.  7.  Thoroughly rinse your body with warm water from the neck down.  8.  DO NOT shower/wash with your normal soap after using and rinsing off the CHG Soap.  9.  Pat yourself dry with a clean towel.            10.  Wear clean pajamas.            11.  Place clean sheets on your bed the night of your first shower and do not sleep with pets.  Day of Surgery  Shower as above. Do not apply any lotions/deodorants the morning of surgery.   Please wear clean clothes to the hospital. Remember to brush your teeth with toothpaste.   Please read over the fact sheets that you were given.

## 2018-04-08 MED ORDER — SODIUM CHLORIDE 0.9 % IV SOLN
INTRAVENOUS | Status: DC
  Filled 2018-04-08: qty 30

## 2018-04-08 MED ORDER — VANCOMYCIN HCL 10 G IV SOLR
1250.0000 mg | INTRAVENOUS | Status: AC
  Administered 2018-04-09: 1250 mg via INTRAVENOUS
  Filled 2018-04-08: qty 1250

## 2018-04-08 MED ORDER — DEXMEDETOMIDINE HCL IN NACL 400 MCG/100ML IV SOLN
0.1000 ug/kg/h | INTRAVENOUS | Status: AC
  Administered 2018-04-09: 1 ug/kg/h via INTRAVENOUS
  Filled 2018-04-08: qty 100

## 2018-04-08 MED ORDER — EPINEPHRINE PF 1 MG/ML IJ SOLN
0.0000 ug/min | INTRAVENOUS | Status: DC
  Filled 2018-04-08: qty 4

## 2018-04-08 MED ORDER — NITROGLYCERIN IN D5W 200-5 MCG/ML-% IV SOLN
2.0000 ug/min | INTRAVENOUS | Status: DC
  Filled 2018-04-08: qty 250

## 2018-04-08 MED ORDER — MAGNESIUM SULFATE 50 % IJ SOLN
40.0000 meq | INTRAMUSCULAR | Status: DC
  Filled 2018-04-08: qty 9.85

## 2018-04-08 MED ORDER — PHENYLEPHRINE HCL-NACL 20-0.9 MG/250ML-% IV SOLN
30.0000 ug/min | INTRAVENOUS | Status: DC
  Filled 2018-04-08: qty 250

## 2018-04-08 MED ORDER — SODIUM CHLORIDE 0.9 % IV SOLN
1.5000 g | INTRAVENOUS | Status: AC
  Administered 2018-04-09: 1.5 g via INTRAVENOUS
  Filled 2018-04-08: qty 1.5

## 2018-04-08 MED ORDER — SODIUM CHLORIDE 0.9 % IV SOLN
INTRAVENOUS | Status: DC
  Filled 2018-04-08: qty 1

## 2018-04-08 MED ORDER — NOREPINEPHRINE 4 MG/250ML-% IV SOLN
0.0000 ug/min | INTRAVENOUS | Status: AC
  Administered 2018-04-09: 1 ug/min via INTRAVENOUS
  Filled 2018-04-08: qty 250

## 2018-04-08 MED ORDER — DOPAMINE-DEXTROSE 3.2-5 MG/ML-% IV SOLN
0.0000 ug/kg/min | INTRAVENOUS | Status: DC
  Filled 2018-04-08: qty 250

## 2018-04-08 MED ORDER — POTASSIUM CHLORIDE 2 MEQ/ML IV SOLN
80.0000 meq | INTRAVENOUS | Status: DC
  Filled 2018-04-08: qty 40

## 2018-04-09 ENCOUNTER — Inpatient Hospital Stay (HOSPITAL_COMMUNITY): Payer: Medicare Other | Admitting: Vascular Surgery

## 2018-04-09 ENCOUNTER — Encounter (HOSPITAL_COMMUNITY)
Admission: RE | Disposition: A | Discharge status: Home / Self Care | Payer: Self-pay | Source: Home / Self Care | Attending: Thoracic Surgery (Cardiothoracic Vascular Surgery)

## 2018-04-09 ENCOUNTER — Inpatient Hospital Stay (HOSPITAL_COMMUNITY)
Admission: RE | Admit: 2018-04-09 | Discharge: 2018-04-10 | DRG: 266 | Disposition: A | Discharge status: Home / Self Care | Payer: Medicare Other | Attending: Thoracic Surgery (Cardiothoracic Vascular Surgery) | Admitting: Thoracic Surgery (Cardiothoracic Vascular Surgery)

## 2018-04-09 ENCOUNTER — Encounter (HOSPITAL_COMMUNITY): Payer: Self-pay

## 2018-04-09 ENCOUNTER — Ambulatory Visit (HOSPITAL_COMMUNITY)
Admission: RE | Admit: 2018-04-09 | Discharge: 2018-04-09 | Disposition: A | Discharge status: Home / Self Care | Payer: Medicare Other | Source: Ambulatory Visit | Attending: Cardiovascular Disease | Admitting: Cardiovascular Disease

## 2018-04-09 ENCOUNTER — Other Ambulatory Visit: Payer: Self-pay | Admitting: Physician Assistant

## 2018-04-09 ENCOUNTER — Other Ambulatory Visit: Payer: Self-pay

## 2018-04-09 ENCOUNTER — Inpatient Hospital Stay (HOSPITAL_COMMUNITY): Payer: Medicare Other

## 2018-04-09 DIAGNOSIS — Z7901 Long term (current) use of anticoagulants: Secondary | ICD-10-CM | POA: Diagnosis not present

## 2018-04-09 DIAGNOSIS — I428 Other cardiomyopathies: Secondary | ICD-10-CM | POA: Insufficient documentation

## 2018-04-09 DIAGNOSIS — I2510 Atherosclerotic heart disease of native coronary artery without angina pectoris: Secondary | ICD-10-CM | POA: Diagnosis present

## 2018-04-09 DIAGNOSIS — I350 Nonrheumatic aortic (valve) stenosis: Principal | ICD-10-CM

## 2018-04-09 DIAGNOSIS — Z87891 Personal history of nicotine dependence: Secondary | ICD-10-CM

## 2018-04-09 DIAGNOSIS — I5043 Acute on chronic combined systolic (congestive) and diastolic (congestive) heart failure: Secondary | ICD-10-CM | POA: Diagnosis present

## 2018-04-09 DIAGNOSIS — Z79899 Other long term (current) drug therapy: Secondary | ICD-10-CM

## 2018-04-09 DIAGNOSIS — I130 Hypertensive heart and chronic kidney disease with heart failure and stage 1 through stage 4 chronic kidney disease, or unspecified chronic kidney disease: Secondary | ICD-10-CM | POA: Diagnosis present

## 2018-04-09 DIAGNOSIS — I509 Heart failure, unspecified: Secondary | ICD-10-CM | POA: Insufficient documentation

## 2018-04-09 DIAGNOSIS — Z7982 Long term (current) use of aspirin: Secondary | ICD-10-CM

## 2018-04-09 DIAGNOSIS — Z7984 Long term (current) use of oral hypoglycemic drugs: Secondary | ICD-10-CM

## 2018-04-09 DIAGNOSIS — N183 Chronic kidney disease, stage 3 (moderate): Secondary | ICD-10-CM | POA: Diagnosis present

## 2018-04-09 DIAGNOSIS — Z006 Encounter for examination for normal comparison and control in clinical research program: Secondary | ICD-10-CM

## 2018-04-09 DIAGNOSIS — I5042 Chronic combined systolic (congestive) and diastolic (congestive) heart failure: Secondary | ICD-10-CM | POA: Diagnosis not present

## 2018-04-09 DIAGNOSIS — Z9089 Acquired absence of other organs: Secondary | ICD-10-CM | POA: Diagnosis not present

## 2018-04-09 DIAGNOSIS — Z85828 Personal history of other malignant neoplasm of skin: Secondary | ICD-10-CM | POA: Diagnosis not present

## 2018-04-09 DIAGNOSIS — H548 Legal blindness, as defined in USA: Secondary | ICD-10-CM | POA: Diagnosis present

## 2018-04-09 DIAGNOSIS — Z888 Allergy status to other drugs, medicaments and biological substances status: Secondary | ICD-10-CM

## 2018-04-09 DIAGNOSIS — Z950 Presence of cardiac pacemaker: Secondary | ICD-10-CM | POA: Diagnosis not present

## 2018-04-09 DIAGNOSIS — G20 Parkinson's disease: Secondary | ICD-10-CM | POA: Diagnosis present

## 2018-04-09 DIAGNOSIS — G20A1 Parkinson's disease without dyskinesia, without mention of fluctuations: Secondary | ICD-10-CM | POA: Diagnosis present

## 2018-04-09 DIAGNOSIS — E1122 Type 2 diabetes mellitus with diabetic chronic kidney disease: Secondary | ICD-10-CM | POA: Diagnosis present

## 2018-04-09 DIAGNOSIS — I4821 Permanent atrial fibrillation: Secondary | ICD-10-CM | POA: Diagnosis present

## 2018-04-09 DIAGNOSIS — Z952 Presence of prosthetic heart valve: Secondary | ICD-10-CM | POA: Insufficient documentation

## 2018-04-09 DIAGNOSIS — E119 Type 2 diabetes mellitus without complications: Secondary | ICD-10-CM

## 2018-04-09 DIAGNOSIS — I495 Sick sinus syndrome: Secondary | ICD-10-CM | POA: Insufficient documentation

## 2018-04-09 DIAGNOSIS — I272 Pulmonary hypertension, unspecified: Secondary | ICD-10-CM | POA: Diagnosis present

## 2018-04-09 DIAGNOSIS — I351 Nonrheumatic aortic (valve) insufficiency: Secondary | ICD-10-CM | POA: Insufficient documentation

## 2018-04-09 DIAGNOSIS — I4891 Unspecified atrial fibrillation: Secondary | ICD-10-CM | POA: Insufficient documentation

## 2018-04-09 DIAGNOSIS — I429 Cardiomyopathy, unspecified: Secondary | ICD-10-CM

## 2018-04-09 HISTORY — DX: Presence of prosthetic heart valve: Z95.2

## 2018-04-09 HISTORY — PX: INTRAOPERATIVE TRANSTHORACIC ECHOCARDIOGRAM: SHX6523

## 2018-04-09 HISTORY — PX: TRANSCATHETER AORTIC VALVE REPLACEMENT, TRANSFEMORAL: SHX6400

## 2018-04-09 LAB — POCT I-STAT, CHEM 8
BUN: 24 mg/dL — ABNORMAL HIGH (ref 8–23)
BUN: 25 mg/dL — AB (ref 8–23)
BUN: 24 mg/dL — ABNORMAL HIGH (ref 8–23)
BUN: 25 mg/dL — ABNORMAL HIGH (ref 8–23)
CALCIUM ION: 1.17 mmol/L (ref 1.15–1.40)
CALCIUM ION: 1.2 mmol/L (ref 1.15–1.40)
CHLORIDE: 105 mmol/L (ref 98–111)
CREATININE: 1.4 mg/dL — AB (ref 0.61–1.24)
Calcium, Ion: 1.2 mmol/L (ref 1.15–1.40)
Calcium, Ion: 1.2 mmol/L (ref 1.15–1.40)
Chloride: 107 mmol/L (ref 98–111)
Chloride: 108 mmol/L (ref 98–111)
Chloride: 107 mmol/L (ref 98–111)
Creatinine, Ser: 1.3 mg/dL — ABNORMAL HIGH (ref 0.61–1.24)
Creatinine, Ser: 1.4 mg/dL — ABNORMAL HIGH (ref 0.61–1.24)
Creatinine, Ser: 1.4 mg/dL — ABNORMAL HIGH (ref 0.61–1.24)
GLUCOSE: 106 mg/dL — AB (ref 70–99)
Glucose, Bld: 105 mg/dL — ABNORMAL HIGH (ref 70–99)
Glucose, Bld: 96 mg/dL (ref 70–99)
Glucose, Bld: 103 mg/dL — ABNORMAL HIGH (ref 70–99)
HCT: 36 % — ABNORMAL LOW (ref 39.0–52.0)
HCT: 31 % — ABNORMAL LOW (ref 39.0–52.0)
HCT: 32 % — ABNORMAL LOW (ref 39.0–52.0)
HEMATOCRIT: 32 % — AB (ref 39.0–52.0)
HEMOGLOBIN: 10.9 g/dL — AB (ref 13.0–17.0)
Hemoglobin: 10.9 g/dL — ABNORMAL LOW (ref 13.0–17.0)
Hemoglobin: 12.2 g/dL — ABNORMAL LOW (ref 13.0–17.0)
Hemoglobin: 10.5 g/dL — ABNORMAL LOW (ref 13.0–17.0)
POTASSIUM: 4 mmol/L (ref 3.5–5.1)
Potassium: 3.9 mmol/L (ref 3.5–5.1)
Potassium: 3.9 mmol/L (ref 3.5–5.1)
Potassium: 3.9 mmol/L (ref 3.5–5.1)
SODIUM: 143 mmol/L (ref 135–145)
SODIUM: 143 mmol/L (ref 135–145)
Sodium: 143 mmol/L (ref 135–145)
Sodium: 142 mmol/L (ref 135–145)
TCO2: 24 mmol/L (ref 22–32)
TCO2: 26 mmol/L (ref 22–32)
TCO2: 24 mmol/L (ref 22–32)
TCO2: 26 mmol/L (ref 22–32)

## 2018-04-09 LAB — CUP PACEART REMOTE DEVICE CHECK
Battery Voltage: 2.93 V
Implantable Lead Implant Date: 20141226
Implantable Lead Location: 753858
Implantable Pulse Generator Implant Date: 20141226
Lead Channel Impedance Value: 400 Ohm
Lead Channel Pacing Threshold Amplitude: 0.75 V
Lead Channel Pacing Threshold Pulse Width: 0.4 ms
Lead Channel Pacing Threshold Pulse Width: 0.5 ms
Lead Channel Setting Pacing Amplitude: 2.5 V
Lead Channel Setting Pacing Pulse Width: 0.4 ms
Lead Channel Setting Pacing Pulse Width: 0.5 ms
MDC IDC LEAD IMPLANT DT: 20141226
MDC IDC LEAD LOCATION: 753860
MDC IDC MSMT BATTERY REMAINING LONGEVITY: 71 mo
MDC IDC MSMT BATTERY REMAINING PERCENTAGE: 80 %
MDC IDC MSMT LEADCHNL LV IMPEDANCE VALUE: 610 Ohm
MDC IDC MSMT LEADCHNL LV PACING THRESHOLD AMPLITUDE: 1 V
MDC IDC MSMT LEADCHNL RV SENSING INTR AMPL: 12 mV
MDC IDC PG SERIAL: 7547478
MDC IDC SESS DTM: 20190904141828
MDC IDC SET LEADCHNL RV PACING AMPLITUDE: 2.5 V
MDC IDC SET LEADCHNL RV SENSING SENSITIVITY: 4 mV

## 2018-04-09 LAB — GLUCOSE, CAPILLARY
GLUCOSE-CAPILLARY: 77 mg/dL (ref 70–99)
GLUCOSE-CAPILLARY: 86 mg/dL (ref 70–99)
Glucose-Capillary: 91 mg/dL (ref 70–99)
Glucose-Capillary: 136 mg/dL — ABNORMAL HIGH (ref 70–99)

## 2018-04-09 LAB — PROTIME-INR
INR: 1.17
Prothrombin Time: 14.8 seconds (ref 11.4–15.2)

## 2018-04-09 SURGERY — IMPLANTATION, AORTIC VALVE, TRANSCATHETER, FEMORAL APPROACH
Anesthesia: Monitor Anesthesia Care | Site: Groin | Laterality: Right

## 2018-04-09 MED ORDER — PHENYLEPHRINE 40 MCG/ML (10ML) SYRINGE FOR IV PUSH (FOR BLOOD PRESSURE SUPPORT)
PREFILLED_SYRINGE | INTRAVENOUS | Status: AC
  Filled 2018-04-09: qty 10

## 2018-04-09 MED ORDER — ASPIRIN 81 MG PO CHEW
81.0000 mg | CHEWABLE_TABLET | Freq: Every day | ORAL | Status: DC
  Administered 2018-04-10: 81 mg via ORAL
  Filled 2018-04-09: qty 1

## 2018-04-09 MED ORDER — CARBIDOPA-LEVODOPA 25-100 MG PO TABS
1.0000 | ORAL_TABLET | Freq: Three times a day (TID) | ORAL | Status: DC
  Administered 2018-04-09 – 2018-04-10 (×3): 1 via ORAL
  Filled 2018-04-09 (×4): qty 1

## 2018-04-09 MED ORDER — ONDANSETRON HCL 4 MG/2ML IJ SOLN
INTRAMUSCULAR | Status: AC
  Filled 2018-04-09: qty 2

## 2018-04-09 MED ORDER — INSULIN ASPART 100 UNIT/ML ~~LOC~~ SOLN
0.0000 [IU] | Freq: Three times a day (TID) | SUBCUTANEOUS | Status: DC
  Administered 2018-04-09: 2 [IU] via SUBCUTANEOUS

## 2018-04-09 MED ORDER — OXYCODONE HCL 5 MG PO TABS: 5.0000 mg | ORAL_TABLET | ORAL | Status: DC | PRN

## 2018-04-09 MED ORDER — LACTATED RINGERS IV SOLN
INTRAVENOUS | Status: DC
  Administered 2018-04-09: 09:00:00 via INTRAVENOUS

## 2018-04-09 MED ORDER — CLOPIDOGREL BISULFATE 75 MG PO TABS
75.0000 mg | ORAL_TABLET | Freq: Every day | ORAL | Status: DC
  Administered 2018-04-10: 75 mg via ORAL
  Filled 2018-04-09: qty 1

## 2018-04-09 MED ORDER — SODIUM CHLORIDE 0.9 % IV SOLN
1.5000 g | Freq: Two times a day (BID) | INTRAVENOUS | Status: DC
  Administered 2018-04-09 – 2018-04-10 (×2): 1.5 g via INTRAVENOUS
  Filled 2018-04-09 (×3): qty 1.5

## 2018-04-09 MED ORDER — ONDANSETRON HCL 4 MG/2ML IJ SOLN: 4.0000 mg | Freq: Four times a day (QID) | INTRAMUSCULAR | Status: DC | PRN

## 2018-04-09 MED ORDER — FENTANYL CITRATE (PF) 100 MCG/2ML IJ SOLN
INTRAMUSCULAR | Status: AC
  Administered 2018-04-09: 50 ug via INTRAVENOUS
  Filled 2018-04-09: qty 2

## 2018-04-09 MED ORDER — PROPOFOL 10 MG/ML IV BOLUS
INTRAVENOUS | Status: AC
  Filled 2018-04-09: qty 20

## 2018-04-09 MED ORDER — SODIUM CHLORIDE 0.9 % IV SOLN
INTRAVENOUS | Status: AC
  Administered 2018-04-09: 12:00:00 via INTRAVENOUS

## 2018-04-09 MED ORDER — IODIXANOL 320 MG/ML IV SOLN
INTRAVENOUS | Status: DC | PRN
  Administered 2018-04-09: 24.24 mL via INTRAVENOUS

## 2018-04-09 MED ORDER — ACETAMINOPHEN 325 MG PO TABS: 650.0000 mg | ORAL_TABLET | Freq: Four times a day (QID) | ORAL | Status: DC | PRN

## 2018-04-09 MED ORDER — SODIUM CHLORIDE 0.9 % IV SOLN
INTRAVENOUS | Status: AC
  Filled 2018-04-09 (×3): qty 1.2

## 2018-04-09 MED ORDER — CHLORHEXIDINE GLUCONATE 4 % EX LIQD: 60.0000 mL | Freq: Once | CUTANEOUS | Status: DC

## 2018-04-09 MED ORDER — NITROGLYCERIN IN D5W 200-5 MCG/ML-% IV SOLN: 0.0000 ug/min | INTRAVENOUS | Status: DC

## 2018-04-09 MED ORDER — PROPOFOL 10 MG/ML IV BOLUS
INTRAVENOUS | Status: DC | PRN
  Administered 2018-04-09 (×2): 10 mg via INTRAVENOUS

## 2018-04-09 MED ORDER — MIDAZOLAM HCL 2 MG/2ML IJ SOLN
INTRAMUSCULAR | Status: AC
  Administered 2018-04-09: 1 mg via INTRAVENOUS
  Filled 2018-04-09: qty 2

## 2018-04-09 MED ORDER — PROTAMINE SULFATE 10 MG/ML IV SOLN
INTRAVENOUS | Status: DC | PRN
  Administered 2018-04-09: 10 mg via INTRAVENOUS
  Administered 2018-04-09: 50 mg via INTRAVENOUS
  Administered 2018-04-09: 10 mg via INTRAVENOUS

## 2018-04-09 MED ORDER — HEPARIN SODIUM (PORCINE) 1000 UNIT/ML IJ SOLN
INTRAMUSCULAR | Status: DC | PRN
  Administered 2018-04-09: 7000 [IU] via INTRAVENOUS

## 2018-04-09 MED ORDER — PHENYLEPHRINE HCL-NACL 20-0.9 MG/250ML-% IV SOLN
0.0000 ug/min | INTRAVENOUS | Status: DC
  Filled 2018-04-09: qty 250

## 2018-04-09 MED ORDER — LIDOCAINE HCL (PF) 1 % IJ SOLN
INTRAMUSCULAR | Status: AC
  Filled 2018-04-09: qty 5

## 2018-04-09 MED ORDER — MIDAZOLAM HCL 2 MG/2ML IJ SOLN
1.0000 mg | Freq: Once | INTRAMUSCULAR | Status: AC
  Administered 2018-04-09: 1 mg via INTRAVENOUS

## 2018-04-09 MED ORDER — SODIUM CHLORIDE 0.9% FLUSH: 3.0000 mL | INTRAVENOUS | Status: DC | PRN

## 2018-04-09 MED ORDER — SODIUM CHLORIDE 0.9 % IV SOLN
INTRAVENOUS | Status: DC
  Administered 2018-04-09: 09:00:00 via INTRAVENOUS

## 2018-04-09 MED ORDER — TRAMADOL HCL 50 MG PO TABS: 50.0000 mg | ORAL_TABLET | ORAL | Status: DC | PRN

## 2018-04-09 MED ORDER — DIPHENHYDRAMINE HCL 25 MG PO CAPS: 25.0000 mg | ORAL_CAPSULE | Freq: Every evening | ORAL | Status: DC | PRN

## 2018-04-09 MED ORDER — SODIUM CHLORIDE 0.9 % IV SOLN: 250.0000 mL | INTRAVENOUS | Status: DC | PRN

## 2018-04-09 MED ORDER — SODIUM CHLORIDE 0.9% FLUSH
3.0000 mL | Freq: Two times a day (BID) | INTRAVENOUS | Status: DC
  Administered 2018-04-09 – 2018-04-10 (×2): 3 mL via INTRAVENOUS

## 2018-04-09 MED ORDER — METOPROLOL TARTRATE 5 MG/5ML IV SOLN: 2.5000 mg | INTRAVENOUS | Status: DC | PRN

## 2018-04-09 MED ORDER — SODIUM CHLORIDE 0.9 % IV SOLN
INTRAVENOUS | Status: DC | PRN
  Administered 2018-04-09: 1500 mL

## 2018-04-09 MED ORDER — VANCOMYCIN HCL IN DEXTROSE 1-5 GM/200ML-% IV SOLN
1000.0000 mg | Freq: Once | INTRAVENOUS | Status: AC
  Administered 2018-04-09: 1000 mg via INTRAVENOUS
  Filled 2018-04-09: qty 200

## 2018-04-09 MED ORDER — ACETAMINOPHEN 650 MG RE SUPP: 650.0000 mg | Freq: Four times a day (QID) | RECTAL | Status: DC | PRN

## 2018-04-09 MED ORDER — LIDOCAINE HCL 1 % IJ SOLN
INTRAMUSCULAR | Status: AC
  Filled 2018-04-09: qty 20

## 2018-04-09 MED ORDER — FENTANYL CITRATE (PF) 250 MCG/5ML IJ SOLN
INTRAMUSCULAR | Status: AC
  Filled 2018-04-09: qty 5

## 2018-04-09 MED ORDER — CHLORHEXIDINE GLUCONATE 0.12 % MT SOLN: 15.0000 mL | Freq: Once | OROMUCOSAL | Status: DC

## 2018-04-09 MED ORDER — MORPHINE SULFATE (PF) 2 MG/ML IV SOLN: 2.0000 mg | INTRAVENOUS | Status: DC | PRN

## 2018-04-09 MED ORDER — LIDOCAINE HCL 1 % IJ SOLN
INTRAMUSCULAR | Status: DC | PRN
  Administered 2018-04-09: 6 mL

## 2018-04-09 MED ORDER — ONDANSETRON HCL 4 MG/2ML IJ SOLN
INTRAMUSCULAR | Status: DC | PRN
  Administered 2018-04-09: 4 mg via INTRAVENOUS

## 2018-04-09 MED ORDER — PROPOFOL 500 MG/50ML IV EMUL
INTRAVENOUS | Status: DC | PRN
  Administered 2018-04-09: 10 ug/kg/min via INTRAVENOUS

## 2018-04-09 MED ORDER — CHLORHEXIDINE GLUCONATE 4 % EX LIQD: 30.0000 mL | CUTANEOUS | Status: DC

## 2018-04-09 MED ORDER — FENTANYL CITRATE (PF) 100 MCG/2ML IJ SOLN
50.0000 ug | Freq: Once | INTRAMUSCULAR | Status: AC
  Administered 2018-04-09: 50 ug via INTRAVENOUS

## 2018-04-09 SURGICAL SUPPLY — 89 items
BAG DECANTER FOR FLEXI CONT (MISCELLANEOUS) IMPLANT
BAG SNAP BAND KOVER 36X36 (MISCELLANEOUS) ×10 IMPLANT
BLADE CLIPPER SURG (BLADE) IMPLANT
BLADE OSCILLATING /SAGITTAL (BLADE) IMPLANT
BLADE STERNUM SYSTEM 6 (BLADE) IMPLANT
CABLE ADAPT CONN TEMP 6FT (ADAPTER) ×5 IMPLANT
CANNULA FEM VENOUS REMOTE 22FR (CANNULA) IMPLANT
CANNULA OPTISITE PERFUSION 16F (CANNULA) IMPLANT
CANNULA OPTISITE PERFUSION 18F (CANNULA) IMPLANT
CATH DIAG EXPO 6F VENT PIG 145 (CATHETERS) ×10 IMPLANT
CATH EXPO 5FR AL1 (CATHETERS) IMPLANT
CATH INFINITI 6F AL2 (CATHETERS) IMPLANT
CATH S G BIP PACING (SET/KITS/TRAYS/PACK) ×5 IMPLANT
CLIP VESOCCLUDE MED 24/CT (CLIP) IMPLANT
CLIP VESOCCLUDE SM WIDE 24/CT (CLIP) IMPLANT
CONT SPEC 4OZ CLIKSEAL STRL BL (MISCELLANEOUS) ×10 IMPLANT
COVER BACK TABLE 24X17X13 BIG (DRAPES) IMPLANT
COVER BACK TABLE 80X110 HD (DRAPES) ×5 IMPLANT
COVER DOME SNAP 22 D (MISCELLANEOUS) IMPLANT
COVER WAND RF STERILE (DRAPES) ×5 IMPLANT
CRADLE DONUT ADULT HEAD (MISCELLANEOUS) ×5 IMPLANT
DERMABOND ADHESIVE PROPEN (GAUZE/BANDAGES/DRESSINGS) ×2
DERMABOND ADVANCED (GAUZE/BANDAGES/DRESSINGS) ×2
DERMABOND ADVANCED .7 DNX12 (GAUZE/BANDAGES/DRESSINGS) ×3 IMPLANT
DERMABOND ADVANCED .7 DNX6 (GAUZE/BANDAGES/DRESSINGS) ×3 IMPLANT
DEVICE CLOSURE PERCLS PRGLD 6F (VASCULAR PRODUCTS) ×6 IMPLANT
DRAPE INCISE IOBAN 66X45 STRL (DRAPES) IMPLANT
DRSG TEGADERM 4X4.75 (GAUZE/BANDAGES/DRESSINGS) ×10 IMPLANT
ELECT CAUTERY BLADE 6.4 (BLADE) IMPLANT
ELECT REM PT RETURN 9FT ADLT (ELECTROSURGICAL) ×10
ELECTRODE REM PT RTRN 9FT ADLT (ELECTROSURGICAL) ×6 IMPLANT
FELT TEFLON 6X6 (MISCELLANEOUS) IMPLANT
FEMORAL VENOUS CANN RAP (CANNULA) IMPLANT
GAUZE SPONGE 4X4 12PLY STRL (GAUZE/BANDAGES/DRESSINGS) ×5 IMPLANT
GAUZE SPONGE 4X4 12PLY STRL LF (GAUZE/BANDAGES/DRESSINGS) ×5 IMPLANT
GLOVE BIO SURGEON STRL SZ7.5 (GLOVE) ×5 IMPLANT
GLOVE BIO SURGEON STRL SZ8 (GLOVE) IMPLANT
GLOVE EUDERMIC 7 POWDERFREE (GLOVE) IMPLANT
GLOVE ORTHO TXT STRL SZ7.5 (GLOVE) IMPLANT
GOWN STRL REUS W/ TWL LRG LVL3 (GOWN DISPOSABLE) IMPLANT
GOWN STRL REUS W/ TWL XL LVL3 (GOWN DISPOSABLE) ×3 IMPLANT
GOWN STRL REUS W/TWL LRG LVL3 (GOWN DISPOSABLE)
GOWN STRL REUS W/TWL XL LVL3 (GOWN DISPOSABLE) ×2
GUIDEWIRE SAFE TJ AMPLATZ EXST (WIRE) ×5 IMPLANT
GUIDEWIRE STRAIGHT .035 260CM (WIRE) ×5 IMPLANT
INSERT FOGARTY SM (MISCELLANEOUS) IMPLANT
KIT BASIN OR (CUSTOM PROCEDURE TRAY) ×5 IMPLANT
KIT DILATOR VASC 18G NDL (KITS) IMPLANT
KIT HEART LEFT (KITS) ×5 IMPLANT
KIT SUCTION CATH 14FR (SUCTIONS) IMPLANT
KIT TURNOVER KIT B (KITS) ×5 IMPLANT
LOOP VESSEL MAXI BLUE (MISCELLANEOUS) IMPLANT
LOOP VESSEL MINI RED (MISCELLANEOUS) IMPLANT
NEEDLE 22X1 1/2 (OR ONLY) (NEEDLE) IMPLANT
NEEDLE PERC 18GX7CM (NEEDLE) ×5 IMPLANT
NS IRRIG 1000ML POUR BTL (IV SOLUTION) ×5 IMPLANT
PACK ENDOVASCULAR (PACKS) ×5 IMPLANT
PAD ARMBOARD 7.5X6 YLW CONV (MISCELLANEOUS) ×10 IMPLANT
PAD ELECT DEFIB RADIOL ZOLL (MISCELLANEOUS) ×5 IMPLANT
PENCIL BUTTON HOLSTER BLD 10FT (ELECTRODE) IMPLANT
PERCLOSE PROGLIDE 6F (VASCULAR PRODUCTS) ×10
SET MICROPUNCTURE 5F STIFF (MISCELLANEOUS) ×5 IMPLANT
SHEATH BRITE TIP 6FR 35CM (SHEATH) ×5 IMPLANT
SHEATH PINNACLE 6F 10CM (SHEATH) ×5 IMPLANT
SHEATH PINNACLE 8F 10CM (SHEATH) ×5 IMPLANT
SLEEVE REPOSITIONING LENGTH 30 (MISCELLANEOUS) ×5 IMPLANT
SPONGE LAP 4X18 RFD (DISPOSABLE) IMPLANT
STOPCOCK MORSE 400PSI 3WAY (MISCELLANEOUS) ×10 IMPLANT
SUT ETHIBOND X763 2 0 SH 1 (SUTURE) IMPLANT
SUT GORETEX CV 4 TH 22 36 (SUTURE) IMPLANT
SUT GORETEX CV4 TH-18 (SUTURE) IMPLANT
SUT MNCRL AB 3-0 PS2 18 (SUTURE) IMPLANT
SUT PROLENE 5 0 C 1 36 (SUTURE) IMPLANT
SUT PROLENE 6 0 C 1 30 (SUTURE) IMPLANT
SUT SILK  1 MH (SUTURE) ×2
SUT SILK 1 MH (SUTURE) ×3 IMPLANT
SUT VIC AB 2-0 CT1 27 (SUTURE)
SUT VIC AB 2-0 CT1 TAPERPNT 27 (SUTURE) IMPLANT
SUT VIC AB 2-0 CTX 36 (SUTURE) IMPLANT
SUT VIC AB 3-0 SH 8-18 (SUTURE) IMPLANT
SYR 50ML LL SCALE MARK (SYRINGE) ×5 IMPLANT
SYR BULB IRRIGATION 50ML (SYRINGE) IMPLANT
SYR CONTROL 10ML LL (SYRINGE) IMPLANT
TAPE CLOTH SURG 4X10 WHT LF (GAUZE/BANDAGES/DRESSINGS) ×5 IMPLANT
TOWEL GREEN STERILE (TOWEL DISPOSABLE) ×10 IMPLANT
TRANSDUCER W/STOPCOCK (MISCELLANEOUS) ×10 IMPLANT
TRAY FOLEY SLVR 16FR TEMP STAT (SET/KITS/TRAYS/PACK) IMPLANT
VALVE HEART TRANSCATH SZ3 29MM (Prosthesis & Implant Heart) ×5 IMPLANT
WIRE .035 3MM-J 145CM (WIRE) ×5 IMPLANT

## 2018-04-09 NOTE — Progress Notes (Signed)
[place] with [facility] regarding pt's pacemaker. Per Aaron Edelman, a rep does not need to be present for this procedure. Orders have been received from device clinic and are located in pt's chart.   Jacqlyn Larsen, RN

## 2018-04-09 NOTE — H&P (Signed)
Cardiology Admission History and Physical:   Patient ID: [name] [name] MRN: [id number]; DOB: [birth year]   Admission date: 04/09/2018  Primary Care Provider: Nicoletta Dress, MD Primary Cardiologist: No primary care provider on file. [place]  Chief Complaint:  Dizziness, dyspnea. Severe AS, here for TAVR   History of Present Illness:   [name] [name] is a 84 yo male with history of severe aortic stenosis, atrial fibrillation, non ischemic cardiomyopathy, chronic combined systolic and diastolic CHF, s/p PPM implantation with biV upgrade, DM, CAD here today for TAVR.   His CAD dates back to 1995 when he was found to have atrial fibrillation. He has undergone AV node ablation. He has been followed for AS. He had worsened dyspnea and dizziness in the summer of 2019 and f/u echo August 2019 showed critical aortic stenosis with mean gradient 70 mmHg, LVEF=35-40%. He was found to have mild CAD by cardiac cath 03/06/18 with mean gradient by cath 49.9 mmHg.    Past Medical History:  Diagnosis Date  . Atrial fibrillation (HCC)    echo- EF 35-40%; LA severely dilated; RA mod dilated, RV systolic pressure 78HYIF; LV systolic fcn mod reduced  . Cancer ([place])    skin cancer  . CHF (congestive heart failure) ([place])   . Diabetes mellitus without complication ([place])   . Hypertension   . Presence of permanent cardiac pacemaker   . Severe aortic stenosis   . SSS (sick sinus syndrome) ([place])    pacemaker placed in 1993  . Tremors of nervous system     Past Surgical History:  Procedure Laterality Date  . AV NODE ABLATION Bilateral 07/04/2013   Procedure: AV NODE ABLATION;  Surgeon: Evans Lance, MD;  Location: [facility] CATH LAB;  Service: Cardiovascular;  Laterality: Bilateral;  . BI-VENTRICULAR PACEMAKER INSERTION Bilateral 07/04/2013   Procedure: BI-VENTRICULAR PACEMAKER INSERTION (CRT-P);  Surgeon: Evans Lance, MD;  Location: [facility] CATH LAB;  Service: Cardiovascular;  Laterality: Bilateral;  . BIV  PACEMAKER GENERATOR CHANGE OUT  11/2004  . COLONOSCOPY    . HERNIA REPAIR     left groin  . LEFT HEART CATHETERIZATION WITH CORONARY ANGIOGRAM N/A 07/07/2013   Procedure: LEFT HEART CATHETERIZATION WITH CORONARY ANGIOGRAM;  Surgeon: Pixie Casino, MD;  Location: [facility] CATH LAB;  Service: Cardiovascular;  Laterality: N/A;  . PACEMAKER INSERTION  1993  . RIGHT/LEFT HEART CATH AND CORONARY ANGIOGRAPHY N/A 03/06/2018   Procedure: RIGHT/LEFT HEART CATH AND CORONARY ANGIOGRAPHY;  Surgeon: Burnell Blanks, MD;  Location: [facility] CV LAB;  Service: Cardiovascular;  Laterality: N/A;  . TONSILLECTOMY     adenoidectomy     Medications Prior to Admission: Prior to Admission medications   Medication Sig Start Date End Date Taking? Authorizing Provider  carbidopa-levodopa (SINEMET IR) 25-100 MG per tablet Take 1 tablet by mouth 3 (three) times daily.   Yes [provider]  diphenhydrAMINE (BENADRYL) 25 mg capsule Take 25 mg by mouth at bedtime as needed for allergies or sleep.   Yes [provider]  furosemide (LASIX) 40 MG tablet Take 40 mg by mouth daily.   Yes [provider]  linagliptin (TRADJENTA) 5 MG TABS tablet Take 5 mg by mouth daily.   Yes [provider]  lisinopril (PRINIVIL,ZESTRIL) 5 MG tablet Take 0.5 tablets (2.5 mg total) by mouth daily. Patient taking differently: Take 5 mg by mouth daily.  02/25/18  Yes Park Liter, MD  metoprolol succinate (TOPROL-XL) 25 MG 24 hr tablet Take 25 mg by  mouth daily. Take with or immediately following a meal.    Yes [provider]  Multiple Vitamins-Minerals (PRESERVISION AREDS 2) CAPS Take 1 capsule by mouth 2 (two) times daily.    Yes [provider]  Omega-3 Fatty Acids (FISH OIL) 1200 MG CAPS Take 1,200 mg by mouth 2 (two) times daily.   Yes [provider]  vitamin E 400 UNIT capsule Take 400 Units by mouth daily.   Yes [provider]  warfarin (COUMADIN) 5 MG  tablet Take 5 mg by mouth See admin instructions. Take 5 mg by mouth every evening except Wednesday take 2.5 mg by mouth in the evening   Yes [provider]  acetaminophen (TYLENOL) 325 MG tablet Take 1-2 tablets (325-650 mg total) by mouth every 4 (four) hours as needed for mild pain. 07/08/13   Erlene Quan, PA-C  aspirin EC 81 MG tablet Take 1 tablet (81 mg total) by mouth daily. Patient not taking: Reported on 04/01/2018 02/25/18   Park Liter, MD     Allergies:    Allergies  Allergen Reactions  . Amiodarone Other (See Comments)    Amiodarone induced hepatitis    Social History:   Social History   Socioeconomic History  . Marital status: Widowed    Spouse name: Not on file  . Number of children: 2  . Years of education: 8  . Highest education level: Not on file  Occupational History  . Occupation: retired  Scientific laboratory technician  . Financial resource strain: Not on file  . Food insecurity:    Worry: Not on file    Inability: Not on file  . Transportation needs:    Medical: Not on file    Non-medical: Not on file  Tobacco Use  . Smoking status: Former Smoker    Last attempt to quit: 07/09/1969    Years since quitting: 48.7  . Smokeless tobacco: Never Used  Substance and Sexual Activity  . Alcohol use: No  . Drug use: No  . Sexual activity: Not on file  Lifestyle  . Physical activity:    Days per week: Not on file    Minutes per session: Not on file  . Stress: Not on file  Relationships  . Social connections:    Talks on phone: Not on file    Gets together: Not on file    Attends religious service: Not on file    Active member of club or organization: Not on file    Attends meetings of clubs or organizations: Not on file    Relationship status: Not on file  . Intimate partner violence:    Fear of current or ex partner: Not on file    Emotionally abused: Not on file    Physically abused: Not on file    Forced sexual activity: Not on file  Other  Topics Concern  . Not on file  Social History Narrative   Widower.  Retired Hydrographic surveyor.    Family History:   The patient's family history is not on file.    ROS:  Please see the history of present illness.  All other ROS reviewed and negative.     Physical Exam/Data:   Vitals:   04/09/18 0751 04/09/18 0818  BP: (!) 141/82   Pulse: 77   Resp: 18   Temp: 97.8 F (36.6 C)   SpO2: 100%   Weight:  69.6 kg  Height:  5\' 9"  (1.753 m)   No intake or  output data in the 24 hours ending 04/09/18 0906 Filed Weights   04/09/18 0818  Weight: 69.6 kg   Body mass index is 22.65 kg/m.  General:  Well nourished, well developed, in no acute distress HEENT: normal Lymph: no adenopathy Neck: no JVD Endocrine:  No thryomegaly Vascular: No carotid bruits; FA pulses 2+ bilaterally without bruits  Cardiac:  normal S1, S2; RRR; Loud systolic murmur.  Lungs:  clear to auscultation bilaterally, no wheezing, rhonchi or rales  Abd: soft, nontender, no hepatomegaly  Ext: no LE edema Musculoskeletal:  No deformities, BUE and BLE strength normal and equal Skin: warm and dry  Neuro:  CNs 2-12 intact, no focal abnormalities noted Psych:  Normal affect    EKG:  The ECG that was done was personally reviewed and demonstrates V pacing  Relevant Studies: Transthoracic Echocardiography  Patient: Yakub, Lodes  MR #: [id number]  Study Date: 02/25/2018  Gender: M  Age: 14  Height: 175.3 cm  Weight: 70.3 kg  BSA: 1.85 m^2  Pt. Status:  Room:  ATTENDING Sanda Klein, MD  ORDERING Sanda Klein, MD  REFERRING Sanda Klein, MD  SONOGRAPHER Jimmy Reel, RDCS  PERFORMING [facility] [place]  cc:  -------------------------------------------------------------------  LV EF: 35% - 40%  -------------------------------------------------------------------  Indications: Aortic stenosis 424.1.  -------------------------------------------------------------------  History: PMH: Atrial fibrillation.  Congestive heart failure.  -------------------------------------------------------------------  Study Conclusions  - Left ventricle: The cavity size was normal. Wall thickness was  increased in a pattern of mild LVH. Systolic function was  moderately reduced. The estimated ejection fraction was in the  range of 35% to 40%. Wall motion was normal; there were no  regional wall motion abnormalities.  - Aortic valve: There was critical stenosis. There was mild  regurgitation. Valve area (VTI): 0.37 cm^2. Valve area (Vmax):  0.38 cm^2. Valve area (Vmean): 0.36 cm^2.  - Mitral valve: There was mild to moderate regurgitation.  - Left atrium: The atrium was moderately to severely dilated.  - Right atrium: The atrium was mildly dilated.  Impressions:  - Critical AS.  LVEF 35-40%  Recommendations: Consider TAVR.   -------------------------------------------------------------------  Left ventricle: The cavity size was normal. Wall thickness was  increased in a pattern of mild LVH. Systolic function was  moderately reduced. The estimated ejection fraction was in the  range of 35% to 40%. Wall motion was normal; there were no regional  wall motion abnormalities.  -------------------------------------------------------------------  Aortic valve: Trileaflet; moderately thickened, moderately  calcified leaflets. Mobility was not restricted. Doppler: There  was critical stenosis. There was mild regurgitation. VTI ratio  of LVOT to aortic valve: 0.11. Valve area (VTI): 0.37 cm^2. Indexed  valve area (VTI): 0.2 cm^2/m^2. Peak velocity ratio of LVOT to  aortic valve: 0.11. Valve area (Vmax): 0.38 cm^2. Indexed valve  area (Vmax): 0.21 cm^2/m^2. Mean velocity ratio of LVOT to aortic  valve: 0.11. Valve area (Vmean): 0.36 cm^2. Indexed valve area  (Vmean): 0.2 cm^2/m^2. Mean gradient (S): 70 mm Hg. Peak  gradient (S): 107 mm Hg.  -------------------------------------------------------------------    Aorta: Aortic root: The aortic root was normal in size.  -------------------------------------------------------------------  Mitral valve: Structurally normal valve. Mobility was not  restricted. Doppler: Transvalvular velocity was within the normal  range. There was no evidence for stenosis. There was mild to  moderate regurgitation. Valve area by pressure half-time: 4.23  cm^2. Indexed valve area by pressure half-time: 2.28 cm^2/m^2.  Indexed valve area by continuity equation (using LVOT flow): 1  cm^2/m^2. Mean gradient (D): 2  mm Hg. Peak gradient (D): 5 mm  Hg.  -------------------------------------------------------------------  Left atrium: The atrium was moderately to severely dilated.  -------------------------------------------------------------------  Right ventricle: The cavity size was normal. Wall thickness was  normal. Pacer wire or catheter noted in right ventricle. Systolic  function was normal.  -------------------------------------------------------------------  Pulmonic valve: Structurally normal valve. Cusp separation was  normal. Doppler: Transvalvular velocity was within the normal  range. There was no evidence for stenosis. There was no  regurgitation.  -------------------------------------------------------------------  Tricuspid valve: Structurally normal valve. Doppler:  Transvalvular velocity was within the normal range. There was no  regurgitation.  -------------------------------------------------------------------  Pulmonary artery: The main pulmonary artery was normal-sized.  Systolic pressure was within the normal range.  -------------------------------------------------------------------  Right atrium: The atrium was mildly dilated. Pacer wire or  catheter noted in right atrium.  -------------------------------------------------------------------  Pericardium: There was no pericardial effusion.   -------------------------------------------------------------------  Systemic veins:  Inferior vena cava: The vessel was normal in size.  -------------------------------------------------------------------  Measurements  Left ventricle Value Reference  LV ID, ED, PLAX chordal 52 mm 43 - 52  LV ID, ES, PLAX chordal (H) 43 mm 23 - 38  LV fx shortening, PLAX chordal (L) 17 % >=29  LV PW thickness, ED 13 mm ----------  IVS/LV PW ratio, ED 1 <=1.3  Stroke volume, 2D 48 ml ----------  Stroke volume/bsa, 2D 26 ml/m^2 ----------  LV ejection fraction, 1-p A4C 41 % ----------  LV end-diastolic volume, 2-p 992 ml ----------  LV end-systolic volume, 2-p 67 ml ----------  LV ejection fraction, 2-p 45 % ----------  Stroke volume, 2-p 55 ml ----------  LV end-diastolic volume/bsa, 2-p 66 ml/m^2 ----------  LV end-systolic volume/bsa, 2-p 36 ml/m^2 ----------  Stroke volume/bsa, 2-p 29.9 ml/m^2 ----------  LV e&', lateral 6.31 cm/s ----------  LV E/e&', lateral 18.38 ----------  LV e&', medial 4.99 cm/s ----------  LV E/e&', medial 23.25 ----------  LV e&', average 5.65 cm/s ----------  LV E/e&', average 20.53 ----------  Ventricular septum Value Reference  IVS thickness, ED 13 mm ----------  LVOT Value Reference  LVOT ID, S 21 mm ----------  LVOT area 3.46 cm^2 ----------  LVOT peak velocity, S 57.4 cm/s ----------  LVOT mean velocity, S 40.8 cm/s ----------  LVOT VTI, S 13.9 cm ----------  LVOT peak gradient, S 1 mm Hg ----------  Aortic valve Value Reference  Aortic valve peak velocity, S 517 cm/s ----------  Aortic valve mean velocity, S 388 cm/s ----------  Aortic valve VTI, S 131 cm ----------  Aortic mean gradient, S 70 mm Hg ----------  Aortic peak gradient, S 107 mm Hg ----------  VTI ratio, LVOT/AV 0.11 ----------  Aortic valve area, VTI 0.37 cm^2 ----------  Aortic valve area/bsa, VTI 0.2 cm^2/m^2 ----------  Velocity ratio, peak, LVOT/AV 0.11 ----------  Aortic valve  area, peak velocity 0.38 cm^2 ----------  Aortic valve area/bsa, peak 0.21 cm^2/m^2 ----------  velocity  Velocity ratio, mean, LVOT/AV 0.11 ----------  Aortic valve area, mean velocity 0.36 cm^2 ----------  Aortic valve area/bsa, mean 0.2 cm^2/m^2 ----------  velocity  Aortic regurg pressure half-time 415 ms ----------  Aorta Value Reference  Aortic root ID, ED 37 mm ----------  Ascending aorta ID, A-P, S 32 mm ----------  Left atrium Value Reference  LA ID, A-P, ES 50 mm ----------  LA ID/bsa, A-P (H) 2.7 cm/m^2 <=2.2  LA volume, S 123 ml ----------  LA volume/bsa, S 66.4 ml/m^2 ----------  LA volume, ES, 1-p A4C 88.4 ml ----------  LA  volume/bsa, ES, 1-p A4C 47.7 ml/m^2 ----------  LA volume, ES, 1-p A2C 176 ml ----------  LA volume/bsa, ES, 1-p A2C 95 ml/m^2 ----------  Mitral valve Value Reference  Mitral E-wave peak velocity 116 cm/s ----------  Mitral mean velocity, D 57.5 cm/s ----------  Mitral deceleration time 176 ms 150 - 230  Mitral pressure half-time 52 ms ----------  Mitral mean gradient, D 2 mm Hg ----------  Mitral peak gradient, D 5 mm Hg ----------  Mitral valve area, PHT, DP 4.23 cm^2 ----------  Mitral valve area/bsa, PHT, DP 2.28 cm^2/m^2 ----------  Mitral valve area/bsa, LVOT 1 cm^2/m^2 ----------  continuity  Mitral annulus VTI, D 26 cm ----------  Tricuspid valve Value Reference  Tricuspid regurg peak velocity 293 cm/s ----------  Tricuspid peak RV-RA gradient 34 mm Hg ----------  Right atrium Value Reference  RA ID, S-I, ES, A4C (H) 63.1 mm 34 - 49  RA area, ES, A4C (H) 22.9 cm^2 8.3 - 19.5  RA volume, ES, A/L 74 ml ----------  RA volume/bsa, ES, A/L 39.9 ml/m^2 ----------  Right ventricle Value Reference  TAPSE 15.5 mm ----------  RV s&', lateral, S 7.8 cm/s ----------  Legend:  (L) and (H) mark values outside specified reference range.  -------------------------------------------------------------------  Prepared and Electronically  Authenticated by  Jenne Campus, MD  2019-08-19T16:31:04   RIGHT/LEFT HEART CATH AND CORONARY ANGIOGRAPHY  Conclusion  Prox LAD lesion is 20% stenosed.  There is severe aortic valve stenosis.  Hemodynamic findings consistent with moderate pulmonary hypertension. 1. Mild non-obstructive CAD  2. Severe aortic stenosis (peak to peak gradient 62 mmHg, mean gradient 49.9 mmHg, AVA 0.72 cm2). The valve was crossed with an AL-2 catheter.  Recommendations: Will continue workup for TAVR. He will need to use Lasix.   Indications  Severe aortic stenosis [I35.0 (ICD-10-CM)]  Procedural Details/Technique  Technical Details Indication: 84 yo male with severe aortic stenosis, recent dizziness and dyspnea.   Procedure: The risks, benefits, complications, treatment options, and expected outcomes were discussed with the patient. The patient and/or family concurred with the proposed plan, giving informed consent. The patient was brought to the cath lab after IV hydration was given. The patient was sedated with Versed and Fentanyl. The right groin was prepped and draped. I placed a 7 French sheath in the right femoral vein under u/s guidance. Images saved and stored in the patients chart. Right heart catheterization performed with a balloon tipped catheter. The right wrist was prepped and draped in a sterile fashion. 1% lidocaine was used for local anesthesia. Using the modified Seldinger access technique, a 5 French sheath was placed in the right radial artery. 3 mg Verapamil was given through the sheath. 3500 units IV heparin was given. Standard diagnostic catheters were used to perform selective coronary angiography. I engaged the left main with an AL-1 catheter. I crossed the aortic valve with an AL-1 catheter and a straight wire. No LV gram performed. The sheath was removed from the right radial artery and a Terumo hemostasis band was applied at the arteriotomy site on the right wrist.     Estimated blood  loss <50 mL.  During this procedure the patient was administered the following to achieve and maintain moderate conscious sedation: Versed 1 mg, Fentanyl 25 mcg, while the patient's heart rate, blood pressure, and oxygen saturation were continuously monitored. The period of conscious sedation was 27 minutes, of which I was present face-to-face 100% of this time.  Complications  Complications documented before study signed (03/06/2018 12:44  PM EDT)   RIGHT/LEFT HEART CATH AND CORONARY ANGIOGRAPHY   None Documented by Burnell Blanks, MD 03/06/2018 12:30 PM EDT  Time Range: Intraprocedure    Coronary Findings  Diagnostic  Dominance: Right  Left Anterior Descending  Vessel is large.  Prox LAD lesion 20% stenosed  Prox LAD lesion is 20% stenosed.  Left Circumflex  First Obtuse Marginal Branch  Vessel is angiographically normal.  Right Coronary Artery  Vessel is large. Vessel is angiographically normal.  Intervention  No interventions have been documented.  Right Heart  Right Heart Pressures Hemodynamic findings consistent with moderate pulmonary hypertension. Elevated LV EDP consistent with volume overload.  Left Heart  Aortic Valve There is severe aortic valve stenosis. The aortic valve is calcified.  Coronary Diagrams  Diagnostic Diagram     Implants     No implant documentation for this case.  MERGE Images  Link to Procedure Log   Show images for CARDIAC CATHETERIZATION Procedure Log  Hemo Data   Most Recent Value  Fick Cardiac Output 3.35 L/min  Fick Cardiac Output Index 1.8 (L/min)/BSA  Aortic Mean Gradient 49.9 mmHg  Aortic Peak Gradient 62 mmHg  Aortic Valve Area 0.72  Aortic Value Area Index 0.39 cm2/BSA  RA A Wave 16 mmHg  RA V Wave 17 mmHg  RA Mean 15 mmHg  RV Systolic Pressure 63 mmHg  RV Diastolic Pressure 8 mmHg  RV EDP 17 mmHg  PA Systolic Pressure 61 mmHg  PA Diastolic Pressure 29 mmHg  PA Mean 40 mmHg  PW A Wave 26 mmHg  PW V Wave 26 mmHg  PW  Mean 26 mmHg  AO Systolic Pressure 353 mmHg  AO Diastolic Pressure 65 mmHg  AO Mean 91 mmHg  LV Systolic Pressure 299 mmHg  LV Diastolic Pressure 16 mmHg  LV EDP 22 mmHg  AOp Systolic Pressure 242 mmHg  AOp Diastolic Pressure 64 mmHg  AOp Mean Pressure 93 mmHg  LVp Systolic Pressure 683 mmHg  LVp Diastolic Pressure 15 mmHg  LVp EDP Pressure 25 mmHg  QP/QS 1  TPVR Index 22.25 HRUI  TSVR Index 50.61 HRUI  PVR SVR Ratio 0.18  TPVR/TSVR Ratio 0.44  Cardiac TAVR CT  TECHNIQUE:  The patient was scanned on a [employer]. A 120 kV  retrospective scan was triggered in the descending thoracic aorta at  111 HU's. Gantry rotation speed was 250 msecs and collimation was .6  mm. No beta blockade or nitro were given. The 3D data set was  reconstructed in 5% intervals of the R-R cycle. Systolic and  diastolic phases were analyzed on a dedicated work station using  MPR, MIP and VRT modes. The patient received 80 cc of contrast.  FINDINGS:  Aortic Valve: Trileaflet aortic valve with severely thickened and  calcified leaflets and severely restricted leaflet opening. There  are no calcifications extending into the LVOT.  Aorta: Normal size with mild to moderate diffuse calcifications and  atherosclerotic plaque. No dissection.  Sinotubular Junction: 28 x 26 mm  Ascending Thoracic Aorta: 30 x 28 mm  Aortic Arch: 24 x 24 mm  Descending Thoracic Aorta: 24 x 23 mm  Sinus of Valsalva Measurements:  Non-coronary: 35 mm  Right -coronary: 33 mm  Left -coronary: 36 mm  Coronary Artery Height above Annulus:  Left Main: 16 mm  Right Coronary: 22 mm  Virtual Basal Annulus Measurements:  Maximum/Minimum Diameter: 32.0 x 25.5 mm  Mean Diameter: 28.3 mm  Perimeter: 90.3 mm  Area: 628 mm2  Optimum Fluoroscopic Angle for Delivery: LAO 5 CAU 8  IMPRESSION:  1. Trileaflet aortic valve with severely thickened and calcified  leaflets and severely restricted leaflet opening and no   calcifications extending into the LVOT. Annular measurements  suitable for delivery of a 29 mm Edwards-SAPIEN 3 valve.  2. Sufficient coronary to annulus distance.  3. Optimum Fluoroscopic Angle for Delivery: LAO 5 CAU 8  4. The left atrial appendage is very large with a filling defect at  the very superior portion. This doesn't completely clear on the  delayed imaging but streaks of contrast suggest that this is most  probably artifact (not a thrombus).  Ena Dawley  Electronically Signed  By: Ena Dawley  On: 03/24/2018 21:18  CT ANGIOGRAPHY CHEST, ABDOMEN AND PELVIS  TECHNIQUE:  Multidetector CT imaging through the chest, abdomen and pelvis was  performed using the standard protocol during bolus administration of  intravenous contrast. Multiplanar reconstructed images and MIPs were  obtained and reviewed to evaluate the vascular anatomy.  CONTRAST: 159mL ISOVUE-370 IOPAMIDOL (ISOVUE-370) INJECTION 76%  COMPARISON: Chest CT 06/23/2008. CT the abdomen and pelvis  10/01/2009.  FINDINGS:  CTA CHEST FINDINGS  Cardiovascular: Heart size is enlarged with left atrial dilatation.  Filling defect in the tip of the left atrial appendage, concerning  for potential thrombus. There is no significant pericardial fluid,  thickening or pericardial calcification. There is aortic  atherosclerosis, as well as atherosclerosis of the great vessels of  the mediastinum and the coronary arteries, including calcified  atherosclerotic plaque in the left main and left anterior descending  coronary arteries. Left-sided biventricular pacemaker device in  place with lead tips terminating in the right atrium, right  ventricle and overlying the lateral wall the left ventricle via the  coronary sinus and coronary veins.  Mediastinum/Lymph Nodes: No pathologically enlarged mediastinal or  hilar lymph nodes. Esophagus is unremarkable in appearance. No  axillary lymphadenopathy.  Lungs/Pleura: Trace  bilateral pleural effusions (right greater than  left). Mild diffuse ground-glass attenuation and interlobular septal  thickening, suggesting a background of mild interstitial pulmonary  edema. No acute consolidative airspace disease. No suspicious  appearing pulmonary nodules or masses are noted.  Musculoskeletal/Soft Tissues: Old healed fracture of right clavicle  with posttraumatic deformity. There are no aggressive appearing  lytic or blastic lesions noted in the visualized portions of the  skeleton.  CTA ABDOMEN AND PELVIS FINDINGS  Hepatobiliary: Liver has a shrunken appearance and nodular contour,  suggesting underlying cirrhosis. No definite cystic or solid hepatic  lesions. No intra or extrahepatic biliary ductal dilatation.  Gallbladder is normal in appearance.  Pancreas: Previously noted low-attenuation lesions in the head of  the pancreas and tail of the pancreas appear slightly larger than  the prior examination. The small lesion in the head of the pancreas  measures 1.5 x 1.2 cm (axial image 120 of series 6). The larger  lesion in the tail of the pancreas measures 5.6 x 5.2 x 5.6 cm  (axial image 107 of series 6 and coronal image 74 of series 8). No  pancreatic ductal dilatation. No peripancreatic inflammatory  changes.  Spleen: Unremarkable.  Adrenals/Urinary Tract: Multiple low-attenuation lesions in both  kidneys, largest of which is in the interpolar region of the right  kidney measuring 3.6 cm in diameter. No suspicious renal lesions.  Bilateral adrenal glands are normal in appearance. No  hydroureteronephrosis. Urinary bladder is normal in appearance.  Stomach/Bowel: Normal appearance of the stomach. No pathologic  dilatation of small bowel  or colon. The appendix is not confidently  identified and may be surgically absent. Regardless, there are no  inflammatory changes noted adjacent to the cecum to suggest the  presence of an acute appendicitis at this time.   Vascular/Lymphatic: Aortic atherosclerosis, with vascular findings  and measurements pertinent to potential TAVR procedure, as detailed  below. No aneurysm or dissection noted in the abdominal or pelvic  vasculature. No lymphadenopathy noted in the abdomen or pelvis.  Reproductive: Prostate gland and seminal vesicles are unremarkable  in appearance.  Other: No significant volume of ascites. No pneumoperitoneum.  Musculoskeletal: There are no aggressive appearing lytic or blastic  lesions noted in the visualized portions of the skeleton.  VASCULAR MEASUREMENTS PERTINENT TO TAVR:  AORTA:  Minimal Aortic Diameter - 15 x 9 mm  Severity of Aortic Calcification-moderate  RIGHT PELVIS:  Right Common Iliac Artery -  Minimal Diameter-8.6 x 8.7 mm  Tortuosity-severe  Calcification-mild  Right External Iliac Artery -  Minimal Diameter-8.0 x 7.4 mm  Tortuosity-moderate  Calcification-none  Right Common Femoral Artery -  Minimal Diameter-7.2 x 7.5 mm  Tortuosity-mild  Calcification-mild  LEFT PELVIS:  Left Common Iliac Artery -  Minimal Diameter-8.2 x 6.9 mm  Tortuosity-severe  Calcification-mild  Left External Iliac Artery -  Minimal Diameter-7.9 x 7.5 mm  Tortuosity-moderate  Calcification-none  Left Common Femoral Artery -  Minimal Diameter-7.9 x 6.9 mm  Tortuosity-mild  Calcification-mild  Review of the MIP images confirms the above findings.  IMPRESSION:  1. Vascular findings and measurements pertinent to potential TAVR  procedure, as detailed above.  2. Thickening calcification of the aortic valve, compatible with the  reported clinical history of severe aortic stenosis.  3. Filling defect in the left atrial appendage, concerning for  potential left atrial appendage thrombus. While this could simply  reflect "pseudo thrombus" related to incomplete mixing of contrast  material, attention to this region at time of future transesophageal  echocardiography is recommended, as  this places the patient at risk  for potential systemic embolization.  4. Cardiomegaly with evidence of mild interstitial pulmonary edema  and trace bilateral pleural effusions; imaging findings suggestive  of underlying congestive heart failure.  5. Shrunken appearance and nodular contour of the liver suggesting  early changes of cirrhosis. No suspicious hepatic lesions are noted  at this time.  6. Additional incidental findings, as above.  Electronically Signed  By: Vinnie Langton M.D.  On: 03/22/2018 09:50  STS Risk Calculator  Procedure: Isolated AVR   Risk of Mortality: 5.095%  Renal Failure: 6.229%  Permanent Stroke: 2.216%  Prolonged Ventilation: 16.733%  DSW Infection: 0.076%  Reoperation: 5.559%  Morbidity or Mortality:26.856%  Short Length of Stay: 16.161%  Long Length of Stay: 14.321%    Laboratory Data:  Chemistry Recent Labs  Lab 04/05/18 1042  NA 141  K 4.3  CL 109  CO2 24  GLUCOSE 106*  BUN 30*  CREATININE 1.47*  CALCIUM 9.0  GFRNONAA 40*  GFRAA 47*  ANIONGAP 8    Recent Labs  Lab 04/05/18 1042  PROT 7.0  ALBUMIN 3.7  AST 27  ALT <5  ALKPHOS 49  BILITOT 1.4*   Hematology Recent Labs  Lab 04/05/18 1042  WBC 8.2  RBC 4.72  HGB 14.0  HCT 45.4  MCV 96.2  MCH 29.7  MCHC 30.8  RDW 14.8  PLT 127*   Cardiac EnzymesNo results for input(s): TROPONINI in the last 168 hours. No results for input(s): TROPIPOC in the last 168 hours.  BNP Recent Labs  Lab 04/05/18 1043  BNP 1,303.3*    DDimer No results for input(s): DDIMER in the last 168 hours.  Radiology/Studies:  No results found.  Assessment and Plan:   Severe aortic stenosis:  He has severe, stage D aortic valve stenosis. I have personally reviewed the echo images. The aortic valve is thickened and calcified with limited leaflet mobility. I think he would benefit from AVR. Given advanced age, he is not a good candidate for conventional AVR by surgical approach. I think he is a  good candidate for TAVR.   I have reviewed the natural history of aortic stenosis with the patient and their family members  who are present today. We have discussed the limitations of medical therapy and the poor prognosis associated with symptomatic aortic stenosis. We have reviewed potential treatment options, including palliative medical therapy, conventional surgical aortic valve replacement, and transcatheter aortic valve replacement. We discussed treatment options in the context of the patient's specific comorbid medical conditions.    We will proceed with TAVR today.    Severity of Illness: The appropriate patient status for this patient is INPATIENT. Inpatient status is judged to be reasonable and necessary in order to provide the required intensity of service to ensure the patient's safety. The patient's presenting symptoms, physical exam findings, and initial radiographic and laboratory data in the context of their chronic comorbidities is felt to place them at high risk for further clinical deterioration. Furthermore, it is not anticipated that the patient will be medically stable for discharge from the hospital within 2 midnights of admission. The following factors support the patient status of inpatient.   " The patient's presenting symptoms include dyspnea, dizzienss. " The worrisome physical exam findings include systolic murmur. " The initial radiographic and laboratory data are worrisome because of severe AS " The chronic co-morbidities include AF, NICM, severe AS   * I certify that at the point of admission it is my clinical judgment that the patient will require inpatient hospital care spanning beyond 2 midnights from the point of admission due to high intensity of service, high risk for further deterioration and high frequency of surveillance required.*    For questions or updates, please contact [place] Please consult [email or web address] for contact info under         Signed, Lauree Chandler, MD  04/09/2018 9:06 AM

## 2018-04-09 NOTE — CV Procedure (Signed)
[facility]  TAVR OPERATIVE NOTE   Date of Procedure:  04/09/2018  Preoperative Diagnosis: Severe Aortic Stenosis   Postoperative Diagnosis: Same   Procedure:    Transcatheter Aortic Valve Replacement - Transfemoral Approach  Edwards Sapien 3 THV (size 29 mm, model # [id number], serial # [id number])   Co-Surgeons:  Lauree Chandler, MD and Valentina Gu. Roxy Manns, MD   Anesthesiologist:  Hodierne  Echocardiographer:  Croitoru  Pre-operative Echo Findings:  Severe aortic stenosis  Normal left ventricular systolic function  Post-operative Echo Findings:  No paravalvular leak  Normal left ventricular systolic function  BRIEF CLINICAL NOTE AND INDICATIONS FOR SURGERY  [name] [name] is a 84 yo male with history of severe aortic stenosis, atrial fibrillation, non ischemic cardiomyopathy, chronic combined systolic and diastolic CHF, s/p PPM implantation with biV upgrade, DM, CAD here today for TAVR. His CAD dates back to 1995 when he was found to have atrial fibrillation. He has undergone AV node ablation. He has been followed for AS. He had worsened dyspnea and dizziness in the summer of 2019 and f/u echo August 2019 showed critical aortic stenosis with mean gradient 70 mmHg, LVEF=35-40%. He was found to have mild CAD by cardiac cath 03/06/18 with mean gradient by cath 49.9 mmHg.   During the course of the patient's preoperative work up they have been evaluated comprehensively by a multidisciplinary team of specialists coordinated through the [facility] in the [place] [facility].  They have been demonstrated to suffer from symptomatic severe aortic stenosis as noted above. The patient has been counseled extensively as to the relative risks and benefits of all options for the treatment of severe aortic stenosis including long term medical therapy, conventional surgery for aortic valve replacement, and transcatheter aortic valve  replacement.  The patient has been independently evaluated CT surgeon Dr Roxy Manns and they are felt to be at high risk for conventional surgical aortic valve replacement. Both surgeons indicated the patient would be a poor candidate for conventional surgery. Based upon review of all of the patient's preoperative diagnostic tests they are felt to be candidate for transcatheter aortic valve replacement using the transfemoral approach as an alternative to high risk conventional surgery.    Following the decision to proceed with transcatheter aortic valve replacement, a discussion has been held regarding what types of management strategies would be attempted intraoperatively in the event of life-threatening complications, including whether or not the patient would be considered a candidate for the use of cardiopulmonary bypass and/or conversion to open sternotomy for attempted surgical intervention.  The patient has been advised of a variety of complications that might develop peculiar to this approach including but not limited to risks of death, stroke, paravalvular leak, aortic dissection or other major vascular complications, aortic annulus rupture, device embolization, cardiac rupture or perforation, acute myocardial infarction, arrhythmia, heart block or bradycardia requiring permanent pacemaker placement, congestive heart failure, respiratory failure, renal failure, pneumonia, infection, other late complications related to structural valve deterioration or migration, or other complications that might ultimately cause a temporary or permanent loss of functional independence or other long term morbidity.  The patient provides full informed consent for the procedure as described and all questions were answered preoperatively.    DETAILS OF THE OPERATIVE PROCEDURE  PREPARATION:   The patient is brought to the operating room on the above mentioned date and central monitoring was established by the anesthesia team  including placement of a radial arterial line. The patient  is placed in the supine position on the operating table.  Intravenous antibiotics are administered. Conscious sedation is used.   Baseline transthoracic echocardiogram was performed. The patient's chest, abdomen, both groins, and both lower extremities are prepared and draped in a sterile manner. A time out procedure is performed.   PERIPHERAL ACCESS:   Using the modified Seldinger technique, femoral arterial and venous access were obtained with placement of 6 Fr sheaths on the left side.  A pigtail diagnostic catheter was passed through the femoral arterial sheath under fluoroscopic guidance into the aortic root.  A temporary transvenous pacemaker catheter was passed through the femoral venous sheath under fluoroscopic guidance into the right ventricle.  The pacemaker was tested to ensure stable lead placement and pacemaker capture. Aortic root angiography was performed in order to determine the optimal angiographic angle for valve deployment.  TRANSFEMORAL ACCESS:  A micropuncture kit was used to gain access to the right femoral artery using u/s guidance. Position confirmed with angiography. Pre-closure with double ProGlide closure devices. The patient was heparinized systemically and ACT verified > 250 seconds.    A 16 Fr transfemoral E-sheath was introduced into the right femoral artery after progressively dilating over an Amplatz superstiff wire. An AL-2catheter was used to direct a straight-tip exchange length wire across the native aortic valve into the left ventricle. This was exchanged out for a pigtail catheter and position was confirmed in the LV apex. Simultaneous LV and Ao pressures were recorded.  The pigtail catheter was then exchanged for an Amplatz Extra-stiff wire in the LV apex.   TRANSCATHETER HEART VALVE DEPLOYMENT:  An Edwards Sapien 3 THV (size 29 mm) was prepared and crimped per manufacturer's guidelines, and the proper  orientation of the valve is confirmed on the [employer] delivery system. The valve was advanced through the introducer sheath using normal technique until in an appropriate position in the abdominal aorta beyond the sheath tip. The balloon was then retracted and using the fine-tuning wheel was centered on the valve. The valve was then advanced across the aortic arch using appropriate flexion of the catheter. The valve was carefully positioned across the aortic valve annulus. The Commander catheter was retracted using normal technique. Once final position of the valve has been confirmed by angiographic assessment, the valve is deployed while temporarily holding ventilation and during rapid ventricular pacing to maintain systolic blood pressure < 50 mmHg and pulse pressure < 10 mmHg. The balloon inflation is held for >3 seconds after reaching full deployment volume. Once the balloon has fully deflated the balloon is retracted into the ascending aorta and valve function is assessed using TTE. There is felt to be no paravalvular leak and no central aortic insufficiency.  The patient's hemodynamic recovery following valve deployment is good.  The deployment balloon and guidewire are both removed. Echo demostrated acceptable post-procedural gradients, stable mitral valve function, and no AI.   PROCEDURE COMPLETION:  The sheath was then removed and closure devices were completed. Protamine was administered once femoral arterial repair was complete. The temporary pacemaker, pigtail catheters and femoral sheaths were removed with manual pressure used for hemostasis.   The patient tolerated the procedure well and is transported to the surgical intensive care in stable condition. There were no immediate intraoperative complications. All sponge instrument and needle counts are verified correct at completion of the operation.   No blood products were administered during the operation.  The patient received a  total of 24.2 mL of intravenous contrast during the  procedure.  Lauree Chandler MD 04/09/2018 11:59 AM

## 2018-04-09 NOTE — Progress Notes (Signed)
  Echocardiogram 2D Echocardiogram limited for TAVR has been performed.  Darlina Sicilian M 04/09/2018, 11:51 AM

## 2018-04-09 NOTE — Op Note (Signed)
[facility]   MULTIDISCIPLINARY HEART VALVE TEAM   TAVR OPERATIVE NOTE   Date of Procedure:  04/09/2018  Preoperative Diagnosis: Severe Aortic Stenosis   Postoperative Diagnosis: Same   Procedure:    Transcatheter Aortic Valve Replacement - Percutaneous Right Transfemoral Approach  Edwards Sapien 3 THV (size 29 mm, model # [id number], serial # [id number])   Co-Surgeons:  Valentina Gu. Roxy Manns, MD and Lauree Chandler, MD  Anesthesiologist:  Albertha Ghee, MD  Echocardiographer:  Sanda Klein, MD  Pre-operative Echo Findings:  Severe aortic stenosis  Moderate left ventricular systolic dysfunction  Post-operative Echo Findings:  No paravalvular leak  Unchanged left ventricular systolic function   BRIEF CLINICAL NOTE AND INDICATIONS FOR SURGERY  Patient is an 84 year old male with history of aortic stenosis, nonischemic cardiomyopathy, chronic combined systolic and diastolic congestive heart failure, permanent atrial fibrillation status post AV node ablation and permanent pacemaker implantation, biventricular pacemaker upgrade, and type 2 diabetes mellitus who has been referred for surgical consultation to discuss treatment options for management of severe symptomatic aortic stenosis.  Patient's cardiac history dates back to 1995 when he originally presented with atrial fibrillation.  He was followed for many years by Dr. Rollene Fare and more recently has been followed by Dr. Sallyanne Kuster.  He has been on long-term warfarin anticoagulation and underwent AV node ablation with permanent pacemaker placement.  He had a total of 3 pacemakers, most recently 2014 when his device was upgraded to biventricular pacer.  He was noted to have aortic stenosis that initially was mild to moderate but has gradually increase in severity.  He was seen in follow-up by Dr. Sallyanne Kuster in July and noted to complain of exertional shortness of breath and episodes of dizziness that occur with  exertion.  A follow-up echocardiogram was performed February 25, 2018 and revealed critical aortic stenosis.  Peak velocity across aortic valve was measured 5.2 m/s corresponding to mean transvalvular gradient estimated 70 mmHg.  Left ventricular ejection fraction was estimated 35 to 40%.  Catheterization was performed March 06, 2018 and confirmed the presence of severe aortic stenosis.  Mean transvalvular gradient was measured 49.9 mmHg at catheterization, corresponding to aortic valve area 0.72 cm.  There was mild nonobstructive coronary artery disease and moderate pulmonary hypertension.  CT angiography was performed and the patient referred for surgical consultation.  During the course of the patient's preoperative work up they have been evaluated comprehensively by a multidisciplinary team of specialists coordinated through the [facility] in the [place] [facility].  They have been demonstrated to suffer from symptomatic severe aortic stenosis as noted above. The patient has been counseled extensively as to the relative risks and benefits of all options for the treatment of severe aortic stenosis including long term medical therapy, conventional surgery for aortic valve replacement, and transcatheter aortic valve replacement.  All questions have been answered, and the patient provides full informed consent for the operation as described.   DETAILS OF THE OPERATIVE PROCEDURE  PREPARATION:    The patient is brought to the operating room on the above mentioned date and central monitoring was established by the anesthesia team including placement of a central venous line and radial arterial line. The patient is placed in the supine position on the operating table.  Intravenous antibiotics are administered. The patient is monitored closely throughout the procedure under conscious sedation.  Baseline transthoracic echocardiogram was performed. The patient's  chest, abdomen, both groins, and both lower extremities are prepared  and draped in a sterile manner. A time out procedure is performed.   PERIPHERAL ACCESS:    Using the modified Seldinger technique, femoral arterial and venous access was obtained with placement of 6 Fr sheaths on the left side.  A pigtail diagnostic catheter was passed through the left arterial sheath under fluoroscopic guidance into the aortic root.  A temporary transvenous pacemaker catheter was passed through the left femoral venous sheath under fluoroscopic guidance into the right ventricle.  The pacemaker was tested to ensure stable lead placement and pacemaker capture. Aortic root angiography was performed in order to determine the optimal angiographic angle for valve deployment.   TRANSFEMORAL ACCESS:   Percutaneous transfemoral access and sheath placement was performed using ultrasound guidance.  The right common femoral artery was cannulated using a micropuncture needle and appropriate location was verified using hand injection angiogram.  A pair of Abbott Perclose percutaneous closure devices were placed and a 6 French sheath replaced into the femoral artery.  The patient was heparinized systemically and ACT verified > 250 seconds.    A 16 Fr transfemoral E-sheath was introduced into the right common femoral artery after progressively dilating over an Amplatz superstiff wire. An AL-2 catheter was used to direct a straight-tip exchange length wire across the native aortic valve into the left ventricle. This was exchanged out for a pigtail catheter and position was confirmed in the LV apex. Simultaneous LV and Ao pressures were recorded.  The pigtail catheter was exchanged for an Amplatz Extra-stiff wire in the LV apex.  Echocardiography was utilized to confirm appropriate wire position and no sign of entanglement in the mitral subvalvular apparatus.   TRANSCATHETER HEART VALVE DEPLOYMENT:   An Edwards Sapien 3  transcatheter heart valve (size 29 mm, model #[id number], serial #[id number]) was prepared and crimped per manufacturer's guidelines, and the proper orientation of the valve is confirmed on the [employer] delivery system. The valve was advanced through the introducer sheath using normal technique until in an appropriate position in the abdominal aorta beyond the sheath tip. The balloon was then retracted and using the fine-tuning wheel was centered on the valve. The valve was then advanced across the aortic arch using appropriate flexion of the catheter. The valve was carefully positioned across the aortic valve annulus. The Commander catheter was retracted using normal technique. Once final position of the valve has been confirmed by angiographic assessment, the valve is deployed while temporarily holding ventilation and during rapid ventricular pacing to maintain systolic blood pressure < 50 mmHg and pulse pressure < 10 mmHg. The balloon inflation is held for >3 seconds after reaching full deployment volume. Once the balloon has fully deflated the balloon is retracted into the ascending aorta and valve function is assessed using echocardiography. There is felt to be no paravalvular leak and no central aortic insufficiency.  The patient's hemodynamic recovery following valve deployment is good.  The deployment balloon and guidewire are both removed.    PROCEDURE COMPLETION:   The sheath was removed and femoral artery closure performed.  Protamine was administered once femoral arterial repair was complete. The temporary pacemaker, pigtail catheters and femoral sheaths were removed with manual pressure used for hemostasis.   The patient tolerated the procedure well and is transported to the surgical intensive care in stable condition. There were no immediate intraoperative complications. All sponge instrument and needle counts are verified correct at completion of the operation.   No blood products were  administered during the operation.  The patient  received a total of 24.2 mL of intravenous contrast during the procedure.   Rexene Alberts, MD 04/09/2018 11:54 AM

## 2018-04-09 NOTE — Progress Notes (Signed)
Site area: rt radial arterial line Site Prior to Removal:  Level 0 Pressure Applied For: 10 minutes Manual:   yes Patient Status During Pull:  stable Post Pull Site:  Level 0 Post Pull Instructions Given:  yes Post Pull Pulses Present: rt radial pulse palpable Dressing Applied:  Gauze and tegaderm Bedrest begins @  Comments:

## 2018-04-09 NOTE — Progress Notes (Addendum)
  [place] VALVE TEAM  Patient doing well s/p TAVR. He is hemodynamically stable. Groin sites stable. ECG with paced rhythm Arterial line removed and transferred to 4E. Early ambulation and hopeful discharge over the next 24-48 hours. Interested in learning more about envisage trial.   Angelena Form PA-C  [facility]  Pager [phone]

## 2018-04-09 NOTE — Progress Notes (Signed)
Site area: left groin fa and fv sheaths pulled and pressure held by [employer] Prior to Removal:  Level 0 Pressure Applied For: 20 minutes Manual:   yes Patient Status During Pull:  stable Post Pull Site:  Level  0 Post Pull Instructions Given:  yes Post Pull Pulses Present: left dp palpable Dressing Applied:  Gauze and tegaderm Bedrest begins @ 1230 Comments:

## 2018-04-09 NOTE — Plan of Care (Signed)
Patient described purpose of TAVR procedure. Patient explained steps in recovery process and explained purpose of antibiotics for prevention of infection.

## 2018-04-09 NOTE — Transfer of Care (Signed)
Immediate Anesthesia Transfer of Care Note  Patient: [name] [name]  Procedure(s) Performed: TRANSCATHETER AORTIC VALVE REPLACEMENT, TRANSFEMORAL. 20m Edwards Sapien 3 THV. (Right Groin) INTRAOPERATIVE TRANSTHORACIC ECHOCARDIOGRAM (N/A Chest)  Patient Location: PACU and Cath Lab  Anesthesia Type:MAC  Level of Consciousness: awake, oriented and drowsy  Airway & Oxygen Therapy: Patient Spontanous Breathing and Patient connected to nasal cannula oxygen  Post-op Assessment: Report given to RN and Post -op Vital signs reviewed and stable  Post vital signs: Reviewed and stable  Last Vitals:  Vitals Value Taken Time  BP 107/57 04/09/2018 11:55 AM  Temp    Pulse 70 04/09/2018 11:59 AM  Resp 14 04/09/2018 11:59 AM  SpO2 95 % 04/09/2018 11:59 AM  Vitals shown include unvalidated device data.  Last Pain:  Vitals:   04/09/18 0818  PainSc: 0-No pain      Patients Stated Pain Goal: 3 [id number]  Complications: No apparent anesthesia complications

## 2018-04-09 NOTE — Anesthesia Procedure Notes (Signed)
Procedure Name: MAC Date/Time: 04/09/2018 10:17 AM Performed by: Inda Coke, CRNA Pre-anesthesia Checklist: Patient identified, Emergency Drugs available, Suction available, Timeout performed and Patient being monitored Patient Re-evaluated:Patient Re-evaluated prior to induction Oxygen Delivery Method: Simple face mask Induction Type: IV induction Dental Injury: Teeth and Oropharynx as per pre-operative assessment

## 2018-04-09 NOTE — Progress Notes (Signed)
Pt received from cath lab. VSS. Pt A/Ox4. Bilateral groin level 0. Telemetry applied. CHG complete. Pt and family oriented to room and unit. Will continue to monitor.  Clyde Canterbury, RN

## 2018-04-09 NOTE — Anesthesia Procedure Notes (Signed)
Central Venous Catheter Insertion Performed by: Albertha Ghee, MD, anesthesiologist Start/End10/07/2017 9:35 AM, 04/09/2018 9:48 AM Patient location: Pre-op. Preanesthetic checklist: patient identified, IV checked, site marked, risks and benefits discussed, surgical consent, monitors and equipment checked, pre-op evaluation, timeout performed and anesthesia consent Position: Trendelenburg Lidocaine 1% used for infiltration and patient sedated Hand hygiene performed , maximum sterile barriers used  and Seldinger technique used Catheter size: 7 Fr Central line was placed.Double lumen Procedure performed using ultrasound guided technique. Ultrasound Notes:anatomy identified, needle tip was noted to be adjacent to the nerve/plexus identified, no ultrasound evidence of intravascular and/or intraneural injection and image(s) printed for medical record Attempts: 1 Following insertion, line sutured, dressing applied and Biopatch. Post procedure assessment: blood return through all ports, free fluid flow and no air  Patient tolerated the procedure well with no immediate complications.

## 2018-04-09 NOTE — Progress Notes (Signed)
Pt ambulated in hallway 429ft and returned to chair. Tolerated well. Will continue to monitor.  Clyde Canterbury, RN

## 2018-04-09 NOTE — Interval H&P Note (Signed)
History and Physical Interval Note:  04/09/2018 9:24 AM  [name] [name]  has presented today for surgery, with the diagnosis of Severe Aortic Stenosis  The various methods of treatment have been discussed with the patient and family. After consideration of risks, benefits and other options for treatment, the patient has consented to  Procedure(s): TRANSCATHETER AORTIC VALVE REPLACEMENT, TRANSFEMORAL (N/A) TRANSESOPHAGEAL ECHOCARDIOGRAM (TEE) (N/A) as a surgical intervention .  The patient's history has been reviewed, patient examined, no change in status, stable for surgery.  I have reviewed the patient's chart and labs.  Questions were answered to the patient's satisfaction.     Lauree Chandler

## 2018-04-09 NOTE — Progress Notes (Signed)
Patient interviewed in preop area. Patient able to confirm name, DOB, procedure, allergies, metal in body (pacemaker), npo status and no pain.    Leatha Gilding, RN

## 2018-04-09 NOTE — Anesthesia Preprocedure Evaluation (Signed)
Anesthesia Evaluation  Patient identified by MRN, date of birth, ID band Patient awake    Reviewed: Allergy & Precautions, H&P , NPO status , Patient's Chart, lab work & pertinent test results  Airway Mallampati: II   Neck ROM: full    Dental   Pulmonary former smoker,    breath sounds clear to auscultation       Cardiovascular hypertension, +CHF  + dysrhythmias Atrial Fibrillation + pacemaker + Valvular Problems/Murmurs AS  Rhythm:regular Rate:Normal  EF 35%. Severe AS   Neuro/Psych    GI/Hepatic   Endo/Other  diabetes, Type 2  Renal/GU Renal InsufficiencyRenal disease     Musculoskeletal   Abdominal   Peds  Hematology   Anesthesia Other Findings   Reproductive/Obstetrics                             Anesthesia Physical Anesthesia Plan  ASA: III  Anesthesia Plan: MAC   Post-op Pain Management:    Induction: Intravenous  PONV Risk Score and Plan: 1 and Ondansetron, Dexamethasone and Treatment may vary due to age or medical condition  Airway Management Planned: Simple Face Mask  Additional Equipment:   Intra-op Plan:   Post-operative Plan:   Informed Consent: I have reviewed the patients History and Physical, chart, labs and discussed the procedure including the risks, benefits and alternatives for the proposed anesthesia with the patient or authorized representative who has indicated his/her understanding and acceptance.     Plan Discussed with: CRNA, Anesthesiologist and Surgeon  Anesthesia Plan Comments:         Anesthesia Quick Evaluation

## 2018-04-10 ENCOUNTER — Inpatient Hospital Stay (HOSPITAL_COMMUNITY): Payer: Medicare Other

## 2018-04-10 ENCOUNTER — Encounter (HOSPITAL_COMMUNITY): Payer: Self-pay | Admitting: Cardiovascular Disease

## 2018-04-10 DIAGNOSIS — I5042 Chronic combined systolic (congestive) and diastolic (congestive) heart failure: Secondary | ICD-10-CM

## 2018-04-10 LAB — CBC
HEMATOCRIT: 35.8 % — AB (ref 39.0–52.0)
Hemoglobin: 11.2 g/dL — ABNORMAL LOW (ref 13.0–17.0)
MCH: 30 pg (ref 26.0–34.0)
MCHC: 31.3 g/dL (ref 30.0–36.0)
MCV: 96 fL (ref 78.0–100.0)
Platelets: 104 10*3/uL — ABNORMAL LOW (ref 150–400)
RBC: 3.73 MIL/uL — ABNORMAL LOW (ref 4.22–5.81)
RDW: 14.6 % (ref 11.5–15.5)
WBC: 9.6 10*3/uL (ref 4.0–10.5)

## 2018-04-10 LAB — BASIC METABOLIC PANEL
ANION GAP: 3 — AB (ref 5–15)
BUN: 24 mg/dL — AB (ref 8–23)
CALCIUM: 7.8 mg/dL — AB (ref 8.9–10.3)
CO2: 24 mmol/L (ref 22–32)
Chloride: 110 mmol/L (ref 98–111)
Creatinine, Ser: 1.62 mg/dL — ABNORMAL HIGH (ref 0.61–1.24)
GFR calc Af Amer: 42 mL/min — ABNORMAL LOW (ref >60)
GFR calc non Af Amer: 36 mL/min — ABNORMAL LOW (ref >60)
GLUCOSE: 86 mg/dL (ref 70–99)
Potassium: 4.2 mmol/L (ref 3.5–5.1)
Sodium: 137 mmol/L (ref 135–145)

## 2018-04-10 LAB — GLUCOSE, CAPILLARY
GLUCOSE-CAPILLARY: 104 mg/dL — AB (ref 70–99)
Glucose-Capillary: 95 mg/dL (ref 70–99)

## 2018-04-10 LAB — ECHOCARDIOGRAM COMPLETE
Height: 69 in
WEIGHTICAEL: 2486.4 [oz_av]

## 2018-04-10 LAB — MAGNESIUM: Magnesium: 2.1 mg/dL (ref 1.7–2.4)

## 2018-04-10 MED ORDER — FUROSEMIDE 40 MG PO TABS
40.0000 mg | ORAL_TABLET | Freq: Every day | ORAL | Status: DC
  Administered 2018-04-10: 40 mg via ORAL
  Filled 2018-04-10: qty 1

## 2018-04-10 MED ORDER — METOPROLOL SUCCINATE ER 25 MG PO TB24
25.0000 mg | ORAL_TABLET | Freq: Every day | ORAL | Status: DC
  Administered 2018-04-10: 25 mg via ORAL
  Filled 2018-04-10: qty 1

## 2018-04-10 MED ORDER — CLOPIDOGREL BISULFATE 75 MG PO TABS: 75.0000 mg | ORAL_TABLET | Freq: Every day | ORAL | 1 refills | Status: DC

## 2018-04-10 NOTE — Anesthesia Postprocedure Evaluation (Signed)
Anesthesia Post Note  Patient: JEVIN CAMINO  Procedure(s) Performed: TRANSCATHETER AORTIC VALVE REPLACEMENT, TRANSFEMORAL. 45m Edwards Sapien 3 THV. (Right Groin) INTRAOPERATIVE TRANSTHORACIC ECHOCARDIOGRAM (N/A Chest)     Patient location during evaluation: ICU Anesthesia Type: MAC Level of consciousness: awake and alert Pain management: pain level controlled Vital Signs Assessment: post-procedure vital signs reviewed and stable Respiratory status: spontaneous breathing, nonlabored ventilation, respiratory function stable and patient connected to nasal cannula oxygen Cardiovascular status: stable and blood pressure returned to baseline Postop Assessment: no apparent nausea or vomiting Anesthetic complications: no    Last Vitals:  Vitals:   04/10/18 0458 04/10/18 0750  BP: 118/63 107/65  Pulse: 74 81  Resp: (!) 24 19  Temp: 37.2 C 37.1 C  SpO2: 94% 95%    Last Pain:  Vitals:   04/10/18 0750  TempSrc: Oral  PainSc: 0-No pain                 Madilyn Cephas S

## 2018-04-10 NOTE — Progress Notes (Signed)
Pt given AVS handout; verbalized understanding. VSS at discharge. Pt planning to go home with daughter -in- law.

## 2018-04-10 NOTE — Progress Notes (Addendum)
[facility] VALVE TEAM  Patient Name: LODEN LAURENT Date of Encounter: 04/10/2018  Primary Cardiologist: Dr. Sallyanne Kuster / Dr. Angelena Form & Dr. Roxy Manns (TAVR)  Hospital Problem List     Principal Problem:   S/P TAVR (transcatheter aortic valve replacement) Active Problems:   Permanent atrial fibrillation- AVN RFA 07/05/13   Acute on chronic combined systolic and diastolic CHF (congestive heart failure) ([place])   Biventricular cardiac pacemaker upgrade 07/05/13 ([employer])   Critical aortic valve stenosis   Current use of long term anticoagulation   Diabetes mellitus without complication ([place])   Cardiomyopathy ([place])   Parkinson's disease ([place])     Subjective   Feeling good. No complaints. Hasn't been up to walk yet   Inpatient Medications    Scheduled Meds: . aspirin  81 mg Oral Daily  . carbidopa-levodopa  1 tablet Oral TID  . clopidogrel  75 mg Oral Q breakfast  . insulin aspart  0-24 Units Subcutaneous TID AC & HS  . sodium chloride flush  3 mL Intravenous Q12H   Continuous Infusions: . sodium chloride    . cefUROXime (ZINACEF)  IV Stopped (04/09/18 2231)  . nitroGLYCERIN     PRN Meds: sodium chloride, acetaminophen **OR** acetaminophen, diphenhydrAMINE, metoprolol tartrate, morphine injection, ondansetron (ZOFRAN) IV, oxyCODONE, sodium chloride flush, traMADol   Vital Signs    Vitals:   04/09/18 1951 04/09/18 2349 04/10/18 0458 04/10/18 0750  BP: 125/72 119/67 118/63 107/65  Pulse: 74 76 74 81  Resp:  20 (!) 24 19  Temp: 97.7 F (36.5 C) 99 F (37.2 C) 98.9 F (37.2 C) 98.8 F (37.1 C)  TempSrc: Oral Oral Oral Oral  SpO2: 100% 99% 94% 95%  Weight:   70.5 kg   Height:        Intake/Output Summary (Last 24 hours) at 04/10/2018 0928 Last data filed at 04/10/2018 0515 Gross per 24 hour  Intake 2454.05 ml  Output 25 ml  Net 2429.05 ml   Filed Weights   04/09/18 0818 04/10/18 0458  Weight: 69.6 kg 70.5 kg    Physical  Exam   GEN: Well nourished, well developed, in no acute distress.  HEENT: Grossly normal.  Neck: Supple, no JVD, carotid bruits, or masses. Cardiac: RRR, no murmurs, rubs, or gallops. No clubbing, cyanosis, edema.  Radials/DP/PT 2+ and equal bilaterally.  Respiratory:  Respirations regular and unlabored, clear to auscultation bilaterally. GI: Soft, nontender, nondistended, BS + x 4. MS: no deformity or atrophy. Skin: warm and dry, no rash. Right groin with small, hard hematoma. No pain.   Neuro:  Strength and sensation are intact. Psych: AAOx3.  Normal affect.  Labs    CBC Recent Labs    04/09/18 1200 04/10/18 0459  WBC  --  9.6  HGB 10.9* 11.2*  HCT 32.0* 35.8*  MCV  --  96.0  PLT  --  PENDING   Basic Metabolic Panel Recent Labs    04/09/18 1200 04/10/18 0459  NA 143 137  K 3.9 4.2  CL 105 110  CO2  --  24  GLUCOSE 103* 86  BUN 25* 24*  CREATININE 1.40* 1.62*  CALCIUM  --  7.8*  MG  --  2.1   Liver Function Tests No results for input(s): AST, ALT, ALKPHOS, BILITOT, PROT, ALBUMIN in the last 72 hours. No results for input(s): LIPASE, AMYLASE in the last 72 hours. Cardiac Enzymes No results for input(s): CKTOTAL, CKMB, CKMBINDEX, TROPONINI in the last 72  hours. BNP Invalid input(s): POCBNP D-Dimer No results for input(s): DDIMER in the last 72 hours. Hemoglobin A1C No results for input(s): HGBA1C in the last 72 hours. Fasting Lipid Panel No results for input(s): CHOL, HDL, LDLCALC, TRIG, CHOLHDL, LDLDIRECT in the last 72 hours. Thyroid Function Tests No results for input(s): TSH, T4TOTAL, T3FREE, THYROIDAB in the last 72 hours.  Invalid input(s): FREET3  Telemetry    paced - Personally Reviewed  ECG    paced - Personally Reviewed  Radiology    Dg Chest Port 1 View  Result Date: 04/09/2018 CLINICAL DATA:  Status post trans catheter aortic valve replacement. EXAM: PORTABLE CHEST 1 VIEW COMPARISON:  Chest x-ray of April 05, 2018 FINDINGS: A  prosthetic aortic valve cage is now present. The heart is normal in size. The pulmonary vascularity is not engorged. The ICD is in stable position. A right internal jugular venous catheter is present with the tip projecting over the proximal SVC. There is calcification in the wall of the aortic arch. The bony thorax exhibits no acute abnormality. IMPRESSION: No postprocedure complication following transcatheter aortic valve replacement. Thoracic aortic atherosclerosis. Electronically Signed   By: David  Martinique M.D.   On: 04/09/2018 13:49    Cardiac Studies   TAVR OPERATIVE NOTE   Date of Procedure:                04/09/2018  Preoperative Diagnosis:      Severe Aortic Stenosis   Procedure:        Transcatheter Aortic Valve Replacement - Percutaneous Right Transfemoral Approach             Edwards Sapien 3 THV (size 29 mm, model # 9600TFX, serial # [id number])              Co-Surgeons:                        Valentina Gu. Roxy Manns, MD and Lauree Chandler, MD  Pre-operative Echo Findings: ? Severe aortic stenosis ? Moderate left ventricular systolic dysfunction  Post-operative Echo Findings: ? No paravalvular leak ? Unchanged left ventricular systolic function  ______________  Echo 04/10/18: pending  Patient Profile     NEIKO TRIVEDI is a 84 y.o. male with a history of HTN, SSS s/p BiV PPM, NICM, HTN, CKD, chronic combined S/D CHF, afib on coumadin, DMT2, legal blindness and severe AS who presented to [facility] on 04/09/18 for planned TAVR.    Assessment & Plan    Severe AS: s/p successful TAVR with a 29 mm Edwards Sapien 3 THV via the TF approach on 04/09/18. Post operative echo pending. Groin sites are stable; he has a small, hard hematoma on right groin. ECG with paced rhythm. Continue ASA 81mg  daily. He will resume home coumadin tonight.   Acute on chronic combined S/D CHF: pre admission labs showed elevated BNP ~ 1000. This has been treated with TAVR. I have resumed home lasix  today.   HTN: BP well controlled   S/p PPM: followed by Dr. Sallyanne Kuster   Chronic Atrial fibrillation: resume home Toprol XL. He will resume home coumadin tonight. He takes 5mg  daily except 2.5mg  on Wednesday. He will make an appointment with Dr. Shary Decamp in [place] early next week for an INR check .   DMT2: treated with SSI here  CKD stage III: baseline appears to be ~1.3-1.6. Creat 1.62. Will hold home Lisinopril 5mg  as BP is soft and creat mildly elevated. Will resume at office  follow up  Signed, Angelena Form, PA-C  04/10/2018, 9:28 AM  Pager [phone]  I have seen and examined the patient and agree with the assessment and plan as outlined.  Rexene Alberts, MD 04/10/2018 1:31 PM

## 2018-04-10 NOTE — Discharge Instructions (Signed)

## 2018-04-10 NOTE — Progress Notes (Signed)
2D Echocardiogram has been performed.  [name] [name] 04/10/2018, 10:52 AM

## 2018-04-10 NOTE — Discharge Summary (Addendum)
[name] [name] Discharge Summary    Patient ID: [name] [name] MRN: [id number]; DOB: [birth year]  Admit date: 04/09/2018 Discharge date: 04/10/2018  Primary Care Provider: Nicoletta Dress, MD  Primary Cardiologist: Dr. Sallyanne [name] / Dr. Angelena [name] & Dr. Roxy [name] (TAVR)   Discharge Diagnoses    Principal Problem:   S/P TAVR (transcatheter aortic valve replacement) Active Problems:   Permanent atrial fibrillation- AVN RFA 07/05/13   Acute on chronic combined systolic and diastolic CHF (congestive heart failure) ([place])   Biventricular cardiac pacemaker upgrade 07/05/13 [facility])   Critical aortic valve stenosis   Current use of long term anticoagulation   Diabetes mellitus without complication ([place])   Cardiomyopathy ([place])   Parkinson's disease ([place])   Allergies Allergies  Allergen Reactions  . Amiodarone Other (See Comments)    Amiodarone induced hepatitis    Diagnostic Studies/Procedures   TAVR OPERATIVE NOTE   Date of Procedure:04/09/2018  Preoperative Diagnosis:Severe Aortic Stenosis   Procedure:   Transcatheter Aortic Valve Replacement - PercutaneousRightTransfemoral Approach Edwards Sapien 3 THV (size 20mm, model # 9600TFX, serial # [id number])  Co-Surgeons:[name] H. [name] Manns, MD and [name] Chandler, MD  Pre-operative Echo Findings: ? Severe aortic stenosis ? Moderateleft ventricular systolic dysfunction  Post-operative Echo Findings: ? Noparavalvular leak ? Unchangedleft ventricular systolic function  ______________  Echo 04/10/18: pending    History of Present Illness     [name] [name] is a 84 y.o. male with a history of HTN, SSS s/p BiV PPM, NICM, HTN, CKD, chronic combined S/D CHF, afib on coumadin, DMT2, legal blindness and severe AS who presented to [facility] on 04/09/18 for planned TAVR.   Patient's cardiac history dates back to  1995 when he originally presented with atrial fibrillation.  He was followed for many years by Dr. Rollene Fare and more recently has been followed by Dr. Sallyanne [name].  He has been on long-term warfarin anticoagulation and underwent AV node ablation with permanent pacemaker placement.  He had a total of 3 pacemakers, most recently 2014 when his device was upgraded to biventricular pacer.  He was noted to have aortic stenosis that initially was mild to moderate but has gradually increase in severity.  He was seen in follow-up by Dr. Sallyanne [name] in July and noted to complain of exertional shortness of breath and episodes of dizziness that occur with exertion.  A follow-up echocardiogram was performed February 25, 2018 and revealed critical aortic stenosis.  Peak velocity across aortic valve was measured 5.2 m/s corresponding to mean transvalvular gradient estimated 70 mmHg.  Left ventricular ejection fraction was estimated 35 to 40%.  Catheterization was performed March 06, 2018 and confirmed the presence of severe aortic stenosis.  Mean transvalvular gradient was measured 49.9 mmHg at catheterization, corresponding to aortic valve area 0.72 cm.  There was mild nonobstructive coronary artery disease and moderate pulmonary hypertension.    He was evaluated by the multidisciplinary valve [name] and to be a suitable candidate for TAVR, which was set up for 04/09/2018.  Hospital Course     Consultants: none  Severe AS:s/p successful TAVR with a 29 mm Edwards Sapien 3 THV via the TF approach on 04/09/18. Post operative echo completed but formal read pending at the time of discharge. Groin sites are stable; he has a small, hard hematoma on right groin. ECG with paced rhythm. Continue ASA 81mg  daily. He will resume home coumadin tonight.  Plan for discharge home today with close follow-up in the office.  Acute  on chronic combined S/D CHF: pre admission labs showed elevated BNP ~ 1000. This has been treated with TAVR. I have  resumed home lasix today.   HTN: BP well controlled. I have resumed home Toprol XL.  I will continue to hold home lisinopril.  S/p PPM: followed by Dr. Sallyanne [name]   Chronic atrial fibrillation s/p AV node ablation: he will resume home coumadin tonight. He takes 5mg  daily except 2.5mg  on Wednesday. He will make an appointment with [name] [name] in [place] early next week for an INR check .   DMT2: treated with SSI here, resume home regimen at discharge.  CKD stage III: baseline appears to be ~1.3-1.6. Creat 1.62. Will hold home Lisinopril 5mg  as BP is soft and creat mildly elevated. Will resume at office follow up.  BMET at follow-up _____________  Discharge Vitals Blood pressure 125/61, pulse 78, temperature 98.8 F (37.1 C), temperature source Oral, resp. rate (!) 25, height 5\' 9"  (1.753 m), weight 70.5 kg, SpO2 95 %.  Filed Weights   04/09/18 0818 04/10/18 0458  Weight: 69.6 kg 70.5 kg    Labs & Radiologic Studies    CBC Recent Labs    04/09/18 1200 04/10/18 0459  WBC  --  9.6  HGB 10.9* 11.2*  HCT 32.0* 35.8*  MCV  --  96.0  PLT  --  102*   Basic Metabolic Panel Recent Labs    04/09/18 1200 04/10/18 0459  NA 143 137  K 3.9 4.2  CL 105 110  CO2  --  24  GLUCOSE 103* 86  BUN 25* 24*  CREATININE 1.40* 1.62*  CALCIUM  --  7.8*  MG  --  2.1   Liver Function Tests No results for input(s): AST, ALT, ALKPHOS, BILITOT, PROT, ALBUMIN in the last 72 hours. No results for input(s): LIPASE, AMYLASE in the last 72 hours. Cardiac Enzymes No results for input(s): CKTOTAL, CKMB, CKMBINDEX, TROPONINI in the last 72 hours. BNP Invalid input(s): POCBNP D-Dimer No results for input(s): DDIMER in the last 72 hours. Hemoglobin A1C No results for input(s): HGBA1C in the last 72 hours. Fasting Lipid Panel No results for input(s): CHOL, HDL, LDLCALC, TRIG, CHOLHDL, LDLDIRECT in the last 72 hours. Thyroid Function Tests No results for input(s): TSH, T4TOTAL, T3FREE,  THYROIDAB in the last 72 hours.  Invalid input(s): FREET3 _____________  Dg Chest 2 View  Result Date: 04/05/2018 CLINICAL DATA:  84 year old male undergoing preoperative evaluation prior to TAVR on 04/09/2018 EXAM: CHEST - 2 VIEW COMPARISON:  Prior chest x-ray 02/26/2018 FINDINGS: Left subclavian approach biventricular cardiac rhythm maintenance device. Leads project over the right atrium, right ventricle and overlying the left ventricle. The cardiac and mediastinal contours remain unchanged with borderline cardiomegaly. Atherosclerotic calcifications again noted throughout the transverse aorta. Prominent nipple shadows noted incidentally bilaterally. No evidence of pulmonary edema, pleural effusion, pneumothorax or focal airspace consolidation. Stable hyperinflation and chronic bronchitic changes. No acute osseous abnormality. IMPRESSION: Stable chest x-ray without evidence of acute cardiopulmonary process. Electronically Signed   By: Jacqulynn Cadet M.D.   On: 04/05/2018 15:58   Ct Coronary Morph W/cta Cor W/score W/ca W/cm &/or Wo/cm  Addendum Date: 03/24/2018   ADDENDUM REPORT: 03/24/2018 21:18 CLINICAL DATA:  84 year old male with non-ischemic cardiomyopathy, critical aortic stenosis being evaluated for a TAVR procedure vs AVR. EXAM: Cardiac TAVR CT TECHNIQUE: The patient was scanned on a [employer]. A 120 kV retrospective scan was triggered in the descending thoracic aorta at 111 HU's. Gantry rotation speed  was 250 msecs and collimation was .6 mm. No beta blockade or nitro were given. The 3D data set was reconstructed in 5% intervals of the R-R cycle. Systolic and diastolic phases were analyzed on a dedicated work station using MPR, MIP and VRT modes. The patient received 80 cc of contrast. FINDINGS: Aortic Valve: Trileaflet aortic valve with severely thickened and calcified leaflets and severely restricted leaflet opening. There are no calcifications extending into the LVOT. Aorta:  Normal size with mild to moderate diffuse calcifications and atherosclerotic plaque. No dissection. Sinotubular Junction: 28 x 26 mm Ascending Thoracic Aorta: 30 x 28 mm Aortic Arch: 24 x 24 mm Descending Thoracic Aorta: 24 x 23 mm Sinus of Valsalva Measurements: Non-coronary: 35 mm Right -coronary: 33 mm Left -coronary: 36 mm Coronary Artery Height above Annulus: Left Main: 16 mm Right Coronary: 22 mm Virtual Basal Annulus Measurements: Maximum/Minimum Diameter: 32.0 x 25.5 mm Mean Diameter: 28.3 mm Perimeter: 90.3 mm Area: 628 mm2 Optimum Fluoroscopic Angle for Delivery: LAO 5 CAU 8 IMPRESSION: 1. Trileaflet aortic valve with severely thickened and calcified leaflets and severely restricted leaflet opening and no calcifications extending into the LVOT. Annular measurements suitable for delivery of a 29 mm Edwards-SAPIEN 3 valve. 2. Sufficient coronary to annulus distance. 3. Optimum Fluoroscopic Angle for Delivery:  LAO 5 CAU 8 4. The left atrial appendage is very large with a filling defect at the very superior portion. This doesn't completely clear on the delayed imaging but streaks of contrast suggest that this is most probably artifact (not a thrombus). Ena Dawley Electronically Signed   By: Ena Dawley   On: 03/24/2018 21:18   Result Date: 03/24/2018 EXAM: OVER-READ INTERPRETATION  CT CHEST The following report is an over-read performed by radiologist Dr. Vinnie Langton of [facility], [place] on 03/22/2018. This over-read does not include interpretation of cardiac or coronary anatomy or pathology. The coronary calcium score/coronary CTA interpretation by the cardiologist is attached. COMPARISON:  Chest CT 06/23/2008. FINDINGS: Extracardiac findings will be described separately under dictation for contemporaneously obtained CTA chest, abdomen and pelvis. IMPRESSION: Please see separate dictation for contemporaneously obtained CTA chest, abdomen and pelvis dated 03/21/2018 for full description  of relevant extracardiac findings. Electronically Signed: By: Vinnie Langton M.D. On: 03/22/2018 08:12   Dg Chest Port 1 View  Result Date: 04/09/2018 CLINICAL DATA:  Status post trans catheter aortic valve replacement. EXAM: PORTABLE CHEST 1 VIEW COMPARISON:  Chest x-ray of April 05, 2018 FINDINGS: A prosthetic aortic valve cage is now present. The heart is normal in size. The pulmonary vascularity is not engorged. The ICD is in stable position. A right internal jugular venous catheter is present with the tip projecting over the proximal SVC. There is calcification in the wall of the aortic arch. The bony thorax exhibits no acute abnormality. IMPRESSION: No postprocedure complication following transcatheter aortic valve replacement. Thoracic aortic atherosclerosis. Electronically Signed   By: David  Martinique M.D.   On: 04/09/2018 13:49   Ct Angio Chest Aorta W &/or Wo Contrast  Result Date: 03/22/2018 CLINICAL DATA:  84 year old male with history of severe aortic stenosis. Preprocedural study prior to potential transcatheter aortic valve replacement (TAVR). EXAM: CT ANGIOGRAPHY CHEST, ABDOMEN AND PELVIS TECHNIQUE: Multidetector CT imaging through the chest, abdomen and pelvis was performed using the standard protocol during bolus administration of intravenous contrast. Multiplanar reconstructed images and MIPs were obtained and reviewed to evaluate the vascular anatomy. CONTRAST:  126mL ISOVUE-370 IOPAMIDOL (ISOVUE-370) INJECTION 76% COMPARISON:  Chest CT 06/23/2008.  CT the abdomen and pelvis 10/01/2009. FINDINGS: CTA CHEST FINDINGS Cardiovascular: Heart size is enlarged with left atrial dilatation. Filling defect in the tip of the left atrial appendage, concerning for potential thrombus. There is no significant pericardial fluid, thickening or pericardial calcification. There is aortic atherosclerosis, as well as atherosclerosis of the great vessels of the mediastinum and the coronary arteries,  including calcified atherosclerotic plaque in the left main and left anterior descending coronary arteries. Left-sided biventricular pacemaker device in place with lead tips terminating in the right atrium, right ventricle and overlying the lateral wall the left ventricle via the coronary sinus and coronary veins. Mediastinum/Lymph Nodes: No pathologically enlarged mediastinal or hilar lymph nodes. Esophagus is unremarkable in appearance. No axillary lymphadenopathy. Lungs/Pleura: Trace bilateral pleural effusions (right greater than left). Mild diffuse ground-glass attenuation and interlobular septal thickening, suggesting a background of mild interstitial pulmonary edema. No acute consolidative airspace disease. No suspicious appearing pulmonary nodules or masses are noted. Musculoskeletal/Soft Tissues: Old healed fracture of right clavicle with posttraumatic deformity. There are no aggressive appearing lytic or blastic lesions noted in the visualized portions of the skeleton. CTA ABDOMEN AND PELVIS FINDINGS Hepatobiliary: Liver has a shrunken appearance and nodular contour, suggesting underlying cirrhosis. No definite cystic or solid hepatic lesions. No intra or extrahepatic biliary ductal dilatation. Gallbladder is normal in appearance. Pancreas: Previously noted low-attenuation lesions in the head of the pancreas and tail of the pancreas appear slightly larger than the prior examination. The small lesion in the head of the pancreas measures 1.5 x 1.2 cm (axial image 120 of series 6). The larger lesion in the tail of the pancreas measures 5.6 x 5.2 x 5.6 cm (axial image 107 of series 6 and coronal image 74 of series 8). No pancreatic ductal dilatation. No peripancreatic inflammatory changes. Spleen: Unremarkable. Adrenals/Urinary Tract: Multiple low-attenuation lesions in both kidneys, largest of which is in the interpolar region of the right kidney measuring 3.6 cm in diameter. No suspicious renal lesions.  Bilateral adrenal glands are normal in appearance. No hydroureteronephrosis. Urinary bladder is normal in appearance. Stomach/Bowel: Normal appearance of the stomach. No pathologic dilatation of small bowel or colon. The appendix is not confidently identified and may be surgically absent. Regardless, there are no inflammatory changes noted adjacent to the cecum to suggest the presence of an acute appendicitis at this time. Vascular/Lymphatic: Aortic atherosclerosis, with vascular findings and measurements pertinent to potential TAVR procedure, as detailed below. No aneurysm or dissection noted in the abdominal or pelvic vasculature. No lymphadenopathy noted in the abdomen or pelvis. Reproductive: Prostate gland and seminal vesicles are unremarkable in appearance. Other: No significant volume of ascites.  No pneumoperitoneum. Musculoskeletal: There are no aggressive appearing lytic or blastic lesions noted in the visualized portions of the skeleton. VASCULAR MEASUREMENTS PERTINENT TO TAVR: AORTA: Minimal Aortic Diameter - 15 x 9 mm Severity of Aortic Calcification-moderate RIGHT PELVIS: Right Common Iliac Artery - Minimal Diameter-8.6 x 8.7 mm Tortuosity-severe Calcification-mild Right External Iliac Artery - Minimal Diameter-8.0 x 7.4 mm Tortuosity-moderate Calcification-none Right Common Femoral Artery - Minimal Diameter-7.2 x 7.5 mm Tortuosity-mild Calcification-mild LEFT PELVIS: Left Common Iliac Artery - Minimal Diameter-8.2 x 6.9 mm Tortuosity-severe Calcification-mild Left External Iliac Artery - Minimal Diameter-7.9 x 7.5 mm Tortuosity-moderate Calcification-none Left Common Femoral Artery - Minimal Diameter-7.9 x 6.9 mm Tortuosity-mild Calcification-mild Review of the MIP images confirms the above findings. IMPRESSION: 1. Vascular findings and measurements pertinent to potential TAVR procedure, as detailed above. 2. Thickening calcification of the aortic valve, compatible  with the reported clinical history  of severe aortic stenosis. 3. Filling defect in the left atrial appendage, concerning for potential left atrial appendage thrombus. While this could simply reflect "pseudo thrombus" related to incomplete mixing of contrast material, attention to this region at time of future transesophageal echocardiography is recommended, as this places the patient at risk for potential systemic embolization. 4. Cardiomegaly with evidence of mild interstitial pulmonary edema and trace bilateral pleural effusions; imaging findings suggestive of underlying congestive heart failure. 5. Shrunken appearance and nodular contour of the liver suggesting early changes of cirrhosis. No suspicious hepatic lesions are noted at this time. 6. Additional incidental findings, as above. Electronically Signed   By: Vinnie Langton M.D.   On: 03/22/2018 09:50   Ct Angio Abdomen Pelvis  W &/or Wo Contrast  Result Date: 03/22/2018 CLINICAL DATA:  84 year old male with history of severe aortic stenosis. Preprocedural study prior to potential transcatheter aortic valve replacement (TAVR). EXAM: CT ANGIOGRAPHY CHEST, ABDOMEN AND PELVIS TECHNIQUE: Multidetector CT imaging through the chest, abdomen and pelvis was performed using the standard protocol during bolus administration of intravenous contrast. Multiplanar reconstructed images and MIPs were obtained and reviewed to evaluate the vascular anatomy. CONTRAST:  148mL ISOVUE-370 IOPAMIDOL (ISOVUE-370) INJECTION 76% COMPARISON:  Chest CT 06/23/2008. CT the abdomen and pelvis 10/01/2009. FINDINGS: CTA CHEST FINDINGS Cardiovascular: Heart size is enlarged with left atrial dilatation. Filling defect in the tip of the left atrial appendage, concerning for potential thrombus. There is no significant pericardial fluid, thickening or pericardial calcification. There is aortic atherosclerosis, as well as atherosclerosis of the great vessels of the mediastinum and the coronary arteries, including calcified  atherosclerotic plaque in the left main and left anterior descending coronary arteries. Left-sided biventricular pacemaker device in place with lead tips terminating in the right atrium, right ventricle and overlying the lateral wall the left ventricle via the coronary sinus and coronary veins. Mediastinum/Lymph Nodes: No pathologically enlarged mediastinal or hilar lymph nodes. Esophagus is unremarkable in appearance. No axillary lymphadenopathy. Lungs/Pleura: Trace bilateral pleural effusions (right greater than left). Mild diffuse ground-glass attenuation and interlobular septal thickening, suggesting a background of mild interstitial pulmonary edema. No acute consolidative airspace disease. No suspicious appearing pulmonary nodules or masses are noted. Musculoskeletal/Soft Tissues: Old healed fracture of right clavicle with posttraumatic deformity. There are no aggressive appearing lytic or blastic lesions noted in the visualized portions of the skeleton. CTA ABDOMEN AND PELVIS FINDINGS Hepatobiliary: Liver has a shrunken appearance and nodular contour, suggesting underlying cirrhosis. No definite cystic or solid hepatic lesions. No intra or extrahepatic biliary ductal dilatation. Gallbladder is normal in appearance. Pancreas: Previously noted low-attenuation lesions in the head of the pancreas and tail of the pancreas appear slightly larger than the prior examination. The small lesion in the head of the pancreas measures 1.5 x 1.2 cm (axial image 120 of series 6). The larger lesion in the tail of the pancreas measures 5.6 x 5.2 x 5.6 cm (axial image 107 of series 6 and coronal image 74 of series 8). No pancreatic ductal dilatation. No peripancreatic inflammatory changes. Spleen: Unremarkable. Adrenals/Urinary Tract: Multiple low-attenuation lesions in both kidneys, largest of which is in the interpolar region of the right kidney measuring 3.6 cm in diameter. No suspicious renal lesions. Bilateral adrenal glands  are normal in appearance. No hydroureteronephrosis. Urinary bladder is normal in appearance. Stomach/Bowel: Normal appearance of the stomach. No pathologic dilatation of small bowel or colon. The appendix is not confidently identified and may be surgically absent.  Regardless, there are no inflammatory changes noted adjacent to the cecum to suggest the presence of an acute appendicitis at this time. Vascular/Lymphatic: Aortic atherosclerosis, with vascular findings and measurements pertinent to potential TAVR procedure, as detailed below. No aneurysm or dissection noted in the abdominal or pelvic vasculature. No lymphadenopathy noted in the abdomen or pelvis. Reproductive: Prostate gland and seminal vesicles are unremarkable in appearance. Other: No significant volume of ascites.  No pneumoperitoneum. Musculoskeletal: There are no aggressive appearing lytic or blastic lesions noted in the visualized portions of the skeleton. VASCULAR MEASUREMENTS PERTINENT TO TAVR: AORTA: Minimal Aortic Diameter - 15 x 9 mm Severity of Aortic Calcification-moderate RIGHT PELVIS: Right Common Iliac Artery - Minimal Diameter-8.6 x 8.7 mm Tortuosity-severe Calcification-mild Right External Iliac Artery - Minimal Diameter-8.0 x 7.4 mm Tortuosity-moderate Calcification-none Right Common Femoral Artery - Minimal Diameter-7.2 x 7.5 mm Tortuosity-mild Calcification-mild LEFT PELVIS: Left Common Iliac Artery - Minimal Diameter-8.2 x 6.9 mm Tortuosity-severe Calcification-mild Left External Iliac Artery - Minimal Diameter-7.9 x 7.5 mm Tortuosity-moderate Calcification-none Left Common Femoral Artery - Minimal Diameter-7.9 x 6.9 mm Tortuosity-mild Calcification-mild Review of the MIP images confirms the above findings. IMPRESSION: 1. Vascular findings and measurements pertinent to potential TAVR procedure, as detailed above. 2. Thickening calcification of the aortic valve, compatible with the reported clinical history of severe aortic stenosis.  3. Filling defect in the left atrial appendage, concerning for potential left atrial appendage thrombus. While this could simply reflect "pseudo thrombus" related to incomplete mixing of contrast material, attention to this region at time of future transesophageal echocardiography is recommended, as this places the patient at risk for potential systemic embolization. 4. Cardiomegaly with evidence of mild interstitial pulmonary edema and trace bilateral pleural effusions; imaging findings suggestive of underlying congestive heart failure. 5. Shrunken appearance and nodular contour of the liver suggesting early changes of cirrhosis. No suspicious hepatic lesions are noted at this time. 6. Additional incidental findings, as above. Electronically Signed   By: Vinnie Langton M.D.   On: 03/22/2018 09:50   Disposition   Pt is being discharged home today in good condition.  Follow-up Plans & Appointments    Follow-up Information    Eileen Stanford, PA-C. Go on 04/22/2018.   Specialties:  Cardiology, Radiology Why:  @ 3pm, please arrive at least 10 minutes  Contact information: [address] [place] [id number] [phone]        [name] Dress, MD Follow up.   Specialty:  Internal Medicine Why:  Please make an appointment with Dr. Delena Bali early next week for an INR check. Contact information: [place] [address] [phone]            Discharge Medications   Allergies as of 04/10/2018      Reactions   Amiodarone Other (See Comments)   Amiodarone induced hepatitis      Medication List    STOP taking these medications   lisinopril 5 MG tablet Commonly known as:  PRINIVIL,ZESTRIL     TAKE these medications   acetaminophen 325 MG tablet Commonly known as:  TYLENOL Take 1-2 tablets (325-650 mg total) by mouth every 4 (four) hours as needed for mild pain.   aspirin EC 81 MG tablet Take 1 tablet (81 mg total) by  mouth daily.   carbidopa-levodopa 25-100 MG tablet Commonly known as:  SINEMET IR Take 1 tablet by mouth 3 (three) times daily.   diphenhydrAMINE 25 mg capsule Commonly known as:  BENADRYL Take 25 mg by mouth at bedtime as needed for allergies or sleep.   Fish Oil 1200 MG Caps Take 1,200 mg by mouth 2 (two) times daily.   furosemide 40 MG tablet Commonly known as:  LASIX Take 40 mg by mouth daily.   linagliptin 5 MG Tabs tablet Commonly known as:  TRADJENTA Take 5 mg by mouth daily.   metoprolol succinate 25 MG 24 hr tablet Commonly known as:  TOPROL-XL Take 25 mg by mouth daily. Take with or immediately following a meal.   PRESERVISION AREDS 2 Caps Take 1 capsule by mouth 2 (two) times daily.   vitamin E 400 UNIT capsule Take 400 Units by mouth daily.   warfarin 5 MG tablet Commonly known as:  COUMADIN Take as directed. If you are unsure how to take this medication, talk to your nurse or doctor. Original instructions:  Take 5 mg by mouth See admin instructions. Take 5 mg by mouth every evening except Wednesday take 2.5 mg by mouth in the evening            Outstanding Labs/Studies   BMET, INR  Duration of Discharge Encounter   Greater than 30 minutes including physician time.  Signed, [name] Form, PA-C 04/10/2018, 1:17 PM

## 2018-04-10 NOTE — Progress Notes (Signed)
CARDIAC REHAB PHASE I   PRE:  Rate/Rhythm: paced 99  BP:  Supine:   Sitting:   Standing: 129/86   SaO2: 93%RA  MODE:  Ambulation: 470 ft   POST:  Rate/Rhythm: 108 paced  BP:  Supine:   Sitting: 128/71  Standing:    SaO2: 96%RA [id number] Pt walked 470 ft independently with fairly steady gait. Pt stated he is normally a little wobbly or side steps. Encouraged him to be careful especially with turns in hallway. His son is to be with him at discharge. Gave low sodium diet and discussed briefly. Gave modified ex ed . Pt declined referral to CRP 2 as he will walk for ex.    [name] Good, RN BSN  04/10/2018 9:32 AM

## 2018-04-10 NOTE — Progress Notes (Signed)
CL removed per order. Pressure held; transparent dressing applied. Will continue to monitor.  Jantzen Pilger,RN ,BSN  

## 2018-04-11 ENCOUNTER — Telehealth: Payer: Self-pay

## 2018-04-11 NOTE — Telephone Encounter (Signed)
Patient contacted regarding discharge from [facility] on 04/10/2018.  Patient understands to follow up with provider Nell Range PA-C on 04/22/2018 at 3:00 PM at [facility] office. Patient understands discharge instructions? yes Patient understands medications and regiment? yes Patient understands to bring all medications to this visit? yes  The pt is doing well since discharge from the hospital. He has been outside this morning and taken a walk.  The pt did remove Lisinopril from his home medications. At this time the pt has not arranged a follow-up INR with Dr Delena Bali office.  He plans to call them today.  The pt will contact the office with any additional questions or concerns.

## 2018-04-12 ENCOUNTER — Encounter: Payer: Self-pay | Admitting: Thoracic Surgery (Cardiothoracic Vascular Surgery)

## 2018-04-15 MED FILL — Magnesium Sulfate Inj 50%: INTRAMUSCULAR | Qty: 10 | Status: AC

## 2018-04-15 MED FILL — Potassium Chloride Inj 2 mEq/ML: INTRAVENOUS | Qty: 40 | Status: AC

## 2018-04-15 MED FILL — Phenylephrine HCl IV Soln 10 MG/ML: INTRAVENOUS | Qty: 2 | Status: AC

## 2018-04-15 MED FILL — Heparin Sodium (Porcine) Inj 1000 Unit/ML: INTRAMUSCULAR | Qty: 30 | Status: AC

## 2018-04-15 MED FILL — Sodium Chloride IV Soln 0.9%: INTRAVENOUS | Qty: 250 | Status: AC

## 2018-04-19 ENCOUNTER — Telehealth: Payer: Self-pay

## 2018-04-19 ENCOUNTER — Encounter: Payer: Self-pay | Admitting: Physician Assistant

## 2018-04-19 ENCOUNTER — Telehealth: Payer: Self-pay | Admitting: Cardiovascular Disease

## 2018-04-19 NOTE — Telephone Encounter (Signed)
I spoke with Caryl Pina and gave her information from Dr. Angelena Form

## 2018-04-19 NOTE — Telephone Encounter (Signed)
[name] [name] with Dr. Carolanne Grumbling office contacted [facility] requesting INR goal and coumadin dosage after patient was discharged from the hospital.  I advised her to contact patient's Cardiologist whom preformed and discharged the patient, Dr. Camillia Herter office for those results.  She acknowledged receipt.

## 2018-04-19 NOTE — Telephone Encounter (Signed)
Left message to call back  

## 2018-04-19 NOTE — Telephone Encounter (Signed)
New message   Per [name] [name] need to verify INR goal and the dosage that he had when he  was released from aortic valve replacement  surgery on 04/09/2018. Please call to discuss.

## 2018-04-19 NOTE — Telephone Encounter (Signed)
INR goal is the same as before at 2-3. No change in this plan due to TAVR. He should be on his same dosing regimen as before his TAVR.   Lauree Chandler

## 2018-04-22 ENCOUNTER — Encounter: Payer: Self-pay | Admitting: Physician Assistant

## 2018-04-22 ENCOUNTER — Ambulatory Visit: Payer: Medicare Other | Admitting: Physician Assistant

## 2018-04-22 VITALS — BP 140/78 | HR 70 | Ht 69.0 in | Wt 153.4 lb

## 2018-04-22 DIAGNOSIS — N183 Chronic kidney disease, stage 3 unspecified: Secondary | ICD-10-CM

## 2018-04-22 DIAGNOSIS — I5042 Chronic combined systolic (congestive) and diastolic (congestive) heart failure: Secondary | ICD-10-CM

## 2018-04-22 DIAGNOSIS — Z952 Presence of prosthetic heart valve: Secondary | ICD-10-CM | POA: Diagnosis not present

## 2018-04-22 DIAGNOSIS — I4821 Permanent atrial fibrillation: Secondary | ICD-10-CM | POA: Diagnosis not present

## 2018-04-22 DIAGNOSIS — I10 Essential (primary) hypertension: Secondary | ICD-10-CM

## 2018-04-22 DIAGNOSIS — K869 Disease of pancreas, unspecified: Secondary | ICD-10-CM

## 2018-04-22 NOTE — Patient Instructions (Signed)
Medication Instructions:  Your physician discussed the importance of taking an antibiotic prior to any dental  procedures to prevent damage to the heart valves from infection. You were given a prescription for an antibiotic based on current SBE prophylaxis guidelines.   Labwork: Your physician recommends that you have lab work today: BMET   Testing/Procedures: -None  Follow-Up: Your physician recommends that you keep your scheduled  follow-up appointment on Thursday, November 14 @ 12:30 pm.     Any Other Special Instructions Will Be Listed Below (If Applicable).     If you need a refill on your cardiac medications before your next appointment, please call your pharmacy.

## 2018-04-22 NOTE — Progress Notes (Signed)
Thanks MCr 

## 2018-04-22 NOTE — Progress Notes (Addendum)
[facility] [place]                                       Cardiology Office Note    Date:  04/22/2018   ID:  [name] [name], DOB Apr 08, 1927, MRN [id number]  PCP:  Nicoletta Dress, MD  Cardiologist:  Dr. Sallyanne Kuster / Dr. Angelena Form & Dr. Roxy Manns (TAVR)  CC: TOC s/p TAVR  History of Present Illness:  [name] [name] is a 84 y.o. male with a history of HTN, SSS s/p BiV PPM, NICM, HTN, CKD, chronic combined S/D CHF, afib on coumadin, DMT2, legal blindness and severe AS s/p TAVR who presents to clinic for follow up.   Patient's cardiac history dates back to 1995 when he originally presented with atrial fibrillation. He was followed for many years by Dr. Rollene Fare and more recently has been followed by Dr. Sallyanne Kuster.He has been on long-term warfarin anticoagulation and underwent AV node ablation with permanent pacemaker placement.He had a total of 3 pacemakers, most recently 2014 when his device was upgraded to biventricular pacer. He was noted to have aortic stenosis that initially was mild to moderate but has gradually increase in severity. He was seen in follow-up by Dr. Tylene Fantasia July and noted to complain of exertional shortness of breath and episodes of dizziness that occur with exertion. A follow-up echocardiogram was performed February 25, 2018 and revealed critical aortic stenosis. Peak velocity across aortic valve was measured 5.2 m/s corresponding to mean transvalvular gradient estimated 70 mmHg. Left ventricular ejection fraction was estimated 35 to 40%. Catheterization was performed March 06, 2018 and confirmed the presence of severe aortic stenosis. Mean transvalvular gradient was measured 49.9 mmHg at catheterization, corresponding to aortic valve area 0.72 cm. There was mild nonobstructive coronary artery disease and moderate pulmonary hypertension.   He underwent successful TAVR with a31mm Edwards Sapien 3 THV via the TF approach  on 04/09/18. Post op echo showed EF 35-40%, normally functioning TAVR valve; mean gradient 9 mm Hg and no PVL. He was discharged on ASA 81mg  daily and resumed on home coumadin.  Today he presents to clinic for follow up. No CP or SOB. No LE edema, orthopnea or PND. No dizziness or syncope. No blood in stool or urine. No palpitations. He can tell a big difference in his breathing since TAVR and has more energy and no more dizzy spells.     Past Medical History:  Diagnosis Date  . Atrial fibrillation (HCC)    echo- EF 35-40%; LA severely dilated; RA mod dilated, RV systolic pressure 06YIRS; LV systolic fcn mod reduced  . Cancer ([place])    skin cancer  . CHF (congestive heart failure) ([place])   . Diabetes mellitus without complication ([place])   . Hypertension   . Presence of permanent cardiac pacemaker   . 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 Severe aortic stenosis   . SSS (sick sinus syndrome) ([place])    pacemaker placed in 1993  . Tremors of nervous system     Past Surgical History:  Procedure Laterality Date  . AV NODE ABLATION Bilateral 07/04/2013   Procedure: AV NODE ABLATION;  Surgeon: Evans Lance, MD;  Location: [facility] CATH LAB;  Service: Cardiovascular;  Laterality: Bilateral;  . BI-VENTRICULAR  PACEMAKER INSERTION Bilateral 07/04/2013   Procedure: BI-VENTRICULAR PACEMAKER INSERTION (CRT-P);  Surgeon: Evans Lance, MD;  Location: [facility] CATH LAB;  Service: Cardiovascular;  Laterality: Bilateral;  . BIV PACEMAKER GENERATOR CHANGE OUT  11/2004  . COLONOSCOPY    . HERNIA REPAIR     left groin  . INSERT / REPLACE / REMOVE PACEMAKER    . INTRAOPERATIVE TRANSTHORACIC ECHOCARDIOGRAM N/A 04/09/2018   Procedure: INTRAOPERATIVE TRANSTHORACIC ECHOCARDIOGRAM;  Surgeon: Burnell Blanks, MD;  Location: [facility];  Service: Open Heart Surgery;  Laterality: N/A;  . LEFT  HEART CATHETERIZATION WITH CORONARY ANGIOGRAM N/A 07/07/2013   Procedure: LEFT HEART CATHETERIZATION WITH CORONARY ANGIOGRAM;  Surgeon: Pixie Casino, MD;  Location: [facility] CATH LAB;  Service: Cardiovascular;  Laterality: N/A;  . PACEMAKER INSERTION  1993  . RIGHT/LEFT HEART CATH AND CORONARY ANGIOGRAPHY N/A 03/06/2018   Procedure: RIGHT/LEFT HEART CATH AND CORONARY ANGIOGRAPHY;  Surgeon: Burnell Blanks, MD;  Location: [place] CV LAB;  Service: Cardiovascular;  Laterality: N/A;  . TONSILLECTOMY     adenoidectomy  . TRANSCATHETER AORTIC VALVE REPLACEMENT, TRANSFEMORAL  04/09/2018  . TRANSCATHETER AORTIC VALVE REPLACEMENT, TRANSFEMORAL Right 04/09/2018   Procedure: TRANSCATHETER AORTIC VALVE REPLACEMENT, TRANSFEMORAL. 72mm Edwards Sapien 3 THV.;  Surgeon: Burnell Blanks, MD;  Location: [facility];  Service: Open Heart Surgery;  Laterality: Right;    Current Medications: Outpatient Medications Prior to Visit  Medication Sig Dispense Refill  . acetaminophen (TYLENOL) 325 MG tablet Take 1-2 tablets (325-650 mg total) by mouth every 4 (four) hours as needed for mild pain.    Marland Kitchen aspirin EC 81 MG tablet Take 1 tablet (81 mg total) by mouth daily. 90 tablet 3  . carbidopa-levodopa (SINEMET IR) 25-100 MG per tablet Take 1 tablet by mouth 3 (three) times daily.    . diphenhydrAMINE (BENADRYL) 25 mg capsule Take 25 mg by mouth at bedtime as needed for allergies or sleep.    . furosemide (LASIX) 40 MG tablet Take 40 mg by mouth daily.    Marland Kitchen linagliptin (TRADJENTA) 5 MG TABS tablet Take 5 mg by mouth daily.    . metoprolol succinate (TOPROL-XL) 25 MG 24 hr tablet Take 25 mg by mouth daily. Take with or immediately following a meal.     . Multiple Vitamins-Minerals (PRESERVISION AREDS 2) CAPS Take 1 capsule by mouth 2 (two) times daily.     . Omega-3 Fatty Acids (FISH OIL) 1200 MG CAPS Take 1,200 mg by mouth 2 (two) times daily.    . vitamin E 400 UNIT capsule Take 400 Units by mouth daily.      Marland Kitchen warfarin (COUMADIN) 5 MG tablet Take 5 mg by mouth See admin instructions. Take 5 mg by mouth every evening except Wednesday take 2.5 mg by mouth in the evening     No facility-administered medications prior to visit.      Allergies:   Amiodarone   Social History   Socioeconomic History  . Marital status: Widowed    Spouse name: Not on file  . Number of children: 2  . Years of education: 70  . Highest education level: Not on file  Occupational History  . Occupation: retired  Scientific laboratory technician  . Financial resource strain: Not on file  . Food insecurity:    Worry: Not on file    Inability: Not on file  . Transportation needs:    Medical: Not on file    Non-medical: Not on file  Tobacco Use  . Smoking status:  Former Smoker    Last attempt to quit: 07/09/1969    Years since quitting: 48.8  . Smokeless tobacco: Never Used  Substance and Sexual Activity  . Alcohol use: No  . Drug use: No  . Sexual activity: Not on file  Lifestyle  . Physical activity:    Days per week: Not on file    Minutes per session: Not on file  . Stress: Not on file  Relationships  . Social connections:    Talks on phone: Not on file    Gets together: Not on file    Attends religious service: Not on file    Active member of club or organization: Not on file    Attends meetings of clubs or organizations: Not on file    Relationship status: Not on file  Other Topics Concern  . Not on file  Social History Narrative   Widower.  Retired Hydrographic surveyor.     Family History:  The patient's family history includes Asthma in his father; Coronary artery disease in his mother; Stroke in his mother.     ROS:   Please see the history of present illness.    ROS All other systems reviewed and are negative.   PHYSICAL EXAM:   VS:  BP 140/78   Pulse 70   Ht 5\' 9"  (1.753 m)   Wt 153 lb 6.4 oz (69.6 kg)   SpO2 93%   BMI 22.65 kg/m    GEN: Well nourished, well developed, in no acute distress HEENT:  normal Neck: no JVD or masses Cardiac: RRR; no murmurs, rubs, or gallops,no edema  Respiratory:  clear to auscultation bilaterally, normal work of breathing GI: soft, nontender, nondistended, + BS MS: no deformity or atrophy Skin: warm and dry, no rash. Groin site stable Neuro:  Alert and Oriented x 3, Strength and sensation are intact Psych: euthymic mood, full affect   Wt Readings from Last 3 Encounters:  04/22/18 153 lb 6.4 oz (69.6 kg)  04/10/18 155 lb 6.4 oz (70.5 kg)  04/05/18 153 lb 6.4 oz (69.6 kg)      Studies/Labs Reviewed:   EKG:  EKG is ordered today.  The ekg ordered today demonstrates V paced.   Recent Labs: 04/05/2018: ALT <5; B Natriuretic Peptide 1,303.3 04/10/2018: BUN 24; Creatinine, Ser 1.62; Hemoglobin 11.2; Magnesium 2.1; Platelets 104; Potassium 4.2; Sodium 137   Lipid Panel No results found for: CHOL, TRIG, HDL, CHOLHDL, VLDL, LDLCALC, LDLDIRECT  Additional studies/ records that were reviewed today include:  TAVR OPERATIVE NOTE   Date of Procedure:04/09/2018  Preoperative Diagnosis:Severe Aortic Stenosis   Procedure:   Transcatheter Aortic Valve Replacement - PercutaneousRightTransfemoral Approach Edwards Sapien 3 THV (size 36mm, model # 9600TFX, serial # [id number])  Co-Surgeons:Clarence H. Roxy Manns, MD and Lauree Chandler, MD  Pre-operative Echo Findings: ? Severe aortic stenosis ? Moderateleft ventricular systolic dysfunction  Post-operative Echo Findings: ? Noparavalvular leak ? Unchangedleft ventricular systolic function  ______________  Echo 04/10/18: Study Conclusions - Left ventricle: The cavity size was normal. Wall thickness was   increased in a pattern of moderate LVH. Basal to mid   inferolateral akinesis. Basal anterolateral akinesis.   Indeterminant diastolic function. Systolic function was   moderately reduced. The estimated ejection fraction  was in the   range of 35% to 40%. - Aortic valve: Bioprosthetic aortic valve s/p TAVR. No significant   bioprosthetic valve stenosis. There was no regurgitation. Mean   gradient (S): 9 mm Hg. Valve area (VTI): 1.48 cm^2. Valve  area   (Vmax): 1.75 cm^2. - Mitral valve: Mildly calcified annulus. There was trivial   regurgitation. - Left atrium: The atrium was moderately dilated. - Right ventricle: The cavity size was normal. Pacer wire or   catheter noted in right ventricle. Systolic function was mildly   reduced. - Right atrium: The atrium was mildly dilated. - Tricuspid valve: Peak RV-RA gradient (S): 35 mm Hg. - Pulmonary arteries: PA peak pressure: 38 mm Hg (S). - Inferior vena cava: The vessel was normal in size. The   respirophasic diameter changes were in the normal range (>= 50%),   consistent with normal central venous pressure. Impressions: - Normal LV size with moderate LV hypertrophy. EF 35-40% with basal   to mid inferolateral and basal anterolateral akinesis. Normal RV   size with mildly decreased systolic function. Bioprosthetic   aortic valve s/p TAVR, no significant bioprosthetic valve   stenosis and no regurgitation noted.   ASSESSMENT & PLAN:   Severe AS s/p TAVR:doing excellent. He feels so much better since having TAVR. Groin site is healing well. Continue on Aspirin and coumadin. SBE prophylaxis discussed; he has full dentures and does not go to the dentist. I will see him back 1 month for follow up and echo.   Chronic combined S/D CHF: appear euvolemic. Continue lasix 40 mg daily.  HTN: BP is borderline high. I will resume Lisinopril 5 mg daily if creat stable today.  S/p PPM: followed by Dr. Sallyanne Kuster   Chronic atrial fibrillation s/p AV node ablation: continue coumadin. Followed by PCP  CKD stage III: creat 1.62 at discharge. BMET today  Pancreatic lesions: noted on pre TAVR CT. I have reached out to Dr. Weber Cooks (reading radiologist to see if this  requires any follow up). Reviewed with Dr. Weber Cooks. These are considered "stable" and "low-risk" from an imaging perspective in a patient this age, and require no future imaging follow-up.  Medication Adjustments/Labs and Tests Ordered: Current medicines are reviewed at length with the patient today.  Concerns regarding medicines are outlined above.  Medication changes, Labs and Tests ordered today are listed in the Patient Instructions below. Patient Instructions  Medication Instructions:  Your physician discussed the importance of taking an antibiotic prior to any dental  procedures to prevent damage to the heart valves from infection. You were given a prescription for an antibiotic based on current SBE prophylaxis guidelines.   Labwork: Your physician recommends that you have lab work today: BMET   Testing/Procedures: -None  Follow-Up: Your physician recommends that you keep your scheduled  follow-up appointment on Thursday, November 14 @ 12:30 pm.     Any Other Special Instructions Will Be Listed Below (If Applicable).     If you need a refill on your cardiac medications before your next appointment, please call your pharmacy.      Signed, Angelena Form, PA-C  04/22/2018 3:22 PM    [facility] [address], [place], [place]  [address] Phone: [phone]; Fax: [phone]

## 2018-04-23 ENCOUNTER — Other Ambulatory Visit: Payer: Self-pay | Admitting: Physician Assistant

## 2018-04-23 LAB — BASIC METABOLIC PANEL
BUN / CREAT RATIO: 19 (ref 10–24)
BUN: 29 mg/dL — ABNORMAL HIGH (ref 8–27)
CHLORIDE: 102 mmol/L (ref 96–106)
CO2: 27 mmol/L (ref 20–29)
Calcium: 9 mg/dL (ref 8.6–10.2)
Creatinine, Ser: 1.51 mg/dL — ABNORMAL HIGH (ref 0.76–1.27)
GFR calc Af Amer: 47 mL/min/{1.73_m2} — ABNORMAL LOW (ref >59)
GFR calc non Af Amer: 40 mL/min/{1.73_m2} — ABNORMAL LOW (ref >59)
Glucose: 108 mg/dL — ABNORMAL HIGH (ref 65–99)
POTASSIUM: 4.5 mmol/L (ref 3.5–5.2)
SODIUM: 146 mmol/L — AB (ref 134–144)

## 2018-04-23 MED ORDER — LISINOPRIL 5 MG PO TABS: 5.0000 mg | ORAL_TABLET | Freq: Every day | ORAL | 11 refills | Status: DC

## 2018-05-21 NOTE — Progress Notes (Signed)
[facility]                                       Cardiology Office Note    Date:  05/23/2018   ID:  [name] [name], DOB March 13, 1927, MRN [id number]  PCP:  Nicoletta Dress, MD  Cardiologist: Dr. Agustin Cree ( wants to follow in [place] ) /  Dr. Sallyanne Kuster (EP)  / Dr. Angelena Form & Dr. Roxy Manns (TAVR)  CC: 1 month s/p TAVR  History of Present Illness:  [name] [name] is a 84 y.o. male with a history of HTN, SSS s/p BiV PPM, NICM, HTN, CKD, chronic combined S/D CHF, afib on coumadin, DMT2, legal blindness and severe AS s/p TAVR (04/09/18) who presents to clinic for follow up.   Patient's cardiac history dates back to 1995 when he originally presented with atrial fibrillation. He was followed for many years by Dr. Rollene Fare and more recently has been followed by Dr. Sallyanne Kuster.He has been on long-term warfarin anticoagulation and underwent AV node ablation with permanent pacemaker placement.He had a total of 3 pacemakers, most recently 2014 when his device was upgraded to biventricular pacer. He was noted to have aortic stenosis that initially was mild to moderate but has gradually increase in severity. He was seen in follow-up by Dr. Tylene Fantasia July and noted to complain of exertional shortness of breath and episodes of dizziness that occur with exertion. A follow-up echocardiogram was performed February 25, 2018 and revealed critical aortic stenosis. Peak velocity across aortic valve was measured 5.2 m/s corresponding to mean transvalvular gradient estimated 70 mmHg. Left ventricular ejection fraction was estimated 35 to 40%. Catheterization was performed March 06, 2018 and confirmed the presence of severe aortic stenosis. Mean transvalvular gradient was measured 49.9 mmHg at catheterization, corresponding to aortic valve area 0.72 cm. There was mild nonobstructive coronary artery disease and moderate pulmonary hypertension.   He underwent  successful TAVR with a44mm Edwards Sapien 3 THV via the TF approach on 04/09/18. Post op echo showed EF 35-40%, normally functioning TAVR valve; mean gradient 9 mm Hg and no PVL. He was discharged on ASA 81mg  daily and resumed on home coumadin.  Today he presents to clinic for follow up. No CP or SOB. No LE edema, orthopnea or PND. No dizziness or syncope. No blood in stool or urine. No palpitations. He has noticed improvement in his stamina since his surgery and can do more things than he used to. He also hasn't had anymore episodes of being "dizzyheaded" like he had prior to surgery.    Past Medical History:  Diagnosis Date  . Atrial fibrillation (HCC)    echo- EF 35-40%; LA severely dilated; RA mod dilated, RV systolic pressure [id number]; LV systolic fcn mod reduced  . Cancer ([place])    skin cancer  . CHF (congestive heart failure) ([place])   . Diabetes mellitus without complication ([place])   . Hypertension   . Presence of permanent cardiac pacemaker   . 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 Severe aortic stenosis   . SSS (sick sinus syndrome) ([place])    pacemaker placed in 1993  . Tremors of nervous system     Past Surgical History:  Procedure Laterality Date  . AV NODE ABLATION Bilateral  07/04/2013   Procedure: AV NODE ABLATION;  Surgeon: Evans Lance, MD;  Location: [facility] CATH LAB;  Service: Cardiovascular;  Laterality: Bilateral;  . BI-VENTRICULAR PACEMAKER INSERTION Bilateral 07/04/2013   Procedure: BI-VENTRICULAR PACEMAKER INSERTION (CRT-P);  Surgeon: Evans Lance, MD;  Location: [facility] CATH LAB;  Service: Cardiovascular;  Laterality: Bilateral;  . BIV PACEMAKER GENERATOR CHANGE OUT  11/2004  . COLONOSCOPY    . HERNIA REPAIR     left groin  . INSERT / REPLACE / REMOVE PACEMAKER    . INTRAOPERATIVE TRANSTHORACIC ECHOCARDIOGRAM N/A 04/09/2018   Procedure:  INTRAOPERATIVE TRANSTHORACIC ECHOCARDIOGRAM;  Surgeon: Burnell Blanks, MD;  Location: [place];  Service: Open Heart Surgery;  Laterality: N/A;  . LEFT HEART CATHETERIZATION WITH CORONARY ANGIOGRAM N/A 07/07/2013   Procedure: LEFT HEART CATHETERIZATION WITH CORONARY ANGIOGRAM;  Surgeon: Pixie Casino, MD;  Location: [facility] CATH LAB;  Service: Cardiovascular;  Laterality: N/A;  . PACEMAKER INSERTION  1993  . RIGHT/LEFT HEART CATH AND CORONARY ANGIOGRAPHY N/A 03/06/2018   Procedure: RIGHT/LEFT HEART CATH AND CORONARY ANGIOGRAPHY;  Surgeon: Burnell Blanks, MD;  Location: [place] CV LAB;  Service: Cardiovascular;  Laterality: N/A;  . TONSILLECTOMY     adenoidectomy  . TRANSCATHETER AORTIC VALVE REPLACEMENT, TRANSFEMORAL  04/09/2018  . TRANSCATHETER AORTIC VALVE REPLACEMENT, TRANSFEMORAL Right 04/09/2018   Procedure: TRANSCATHETER AORTIC VALVE REPLACEMENT, TRANSFEMORAL. 5mm Edwards Sapien 3 THV.;  Surgeon: Burnell Blanks, MD;  Location: [place];  Service: Open Heart Surgery;  Laterality: Right;    Current Medications: Outpatient Medications Prior to Visit  Medication Sig Dispense Refill  . acetaminophen (TYLENOL) 325 MG tablet Take 1-2 tablets (325-650 mg total) by mouth every 4 (four) hours as needed for mild pain.    Marland Kitchen aspirin EC 81 MG tablet Take 1 tablet (81 mg total) by mouth daily. 90 tablet 3  . carbidopa-levodopa (SINEMET IR) 25-100 MG per tablet Take 1 tablet by mouth 3 (three) times daily.    . diphenhydrAMINE (BENADRYL) 25 mg capsule Take 25 mg by mouth at bedtime as needed for allergies or sleep.    . furosemide (LASIX) 40 MG tablet Take 40 mg by mouth daily.    Marland Kitchen linagliptin (TRADJENTA) 5 MG TABS tablet Take 5 mg by mouth daily.    Marland Kitchen lisinopril (PRINIVIL,ZESTRIL) 5 MG tablet Take 2.5 mg by mouth daily.    . metoprolol succinate (TOPROL-XL) 25 MG 24 hr tablet Take 25 mg by mouth daily. Take with or immediately following a meal.     . Multiple Vitamins-Minerals  (PRESERVISION AREDS 2) CAPS Take 1 capsule by mouth 2 (two) times daily.     . Omega-3 Fatty Acids (FISH OIL) 1200 MG CAPS Take 1,200 mg by mouth 2 (two) times daily.    . vitamin E 400 UNIT capsule Take 400 Units by mouth daily.    Marland Kitchen warfarin (COUMADIN) 5 MG tablet Take 5 mg by mouth See admin instructions. Take 5 mg by mouth every evening except Wednesday take 2.5 mg by mouth in the evening    . lisinopril (PRINIVIL,ZESTRIL) 5 MG tablet Take 1 tablet (5 mg total) by mouth daily. (Patient not taking: Reported on 05/23/2018) 30 tablet 11   No facility-administered medications prior to visit.      Allergies:   Amiodarone   Social History   Socioeconomic History  . Marital status: Widowed    Spouse name: Not on file  . Number of children: 2  . Years of education: 48  . Highest  education level: Not on file  Occupational History  . Occupation: retired  Scientific laboratory technician  . Financial resource strain: Not on file  . Food insecurity:    Worry: Not on file    Inability: Not on file  . Transportation needs:    Medical: Not on file    Non-medical: Not on file  Tobacco Use  . Smoking status: Former Smoker    Last attempt to quit: 07/09/1969    Years since quitting: 48.9  . Smokeless tobacco: Never Used  Substance and Sexual Activity  . Alcohol use: No  . Drug use: No  . Sexual activity: Not on file  Lifestyle  . Physical activity:    Days per week: Not on file    Minutes per session: Not on file  . Stress: Not on file  Relationships  . Social connections:    Talks on phone: Not on file    Gets together: Not on file    Attends religious service: Not on file    Active member of club or organization: Not on file    Attends meetings of clubs or organizations: Not on file    Relationship status: Not on file  Other Topics Concern  . Not on file  Social History Narrative   Widower.  Retired Hydrographic surveyor.     Family History:  The patient's family history includes Asthma in his  father; Coronary artery disease in his mother; Stroke in his mother.     ROS:   Please see the history of present illness.    ROS All other systems reviewed and are negative.   PHYSICAL EXAM:   VS:  BP 98/76   Pulse 69   Ht 5\' 9"  (1.753 m)   Wt 154 lb (69.9 kg)   SpO2 98%   BMI 22.74 kg/m    GEN: Well nourished, well developed, in no acute distress HEENT: normal Neck: no JVD or masses Cardiac: RRR; no murmurs, rubs, or gallops,no edema  Respiratory:  clear to auscultation bilaterally, normal work of breathing GI: soft, nontender, nondistended, + BS MS: no deformity or atrophy Skin: warm and dry, no rash Neuro:  Alert and Oriented x 3, Strength and sensation are intact Psych: euthymic mood, full affect   Wt Readings from Last 3 Encounters:  05/23/18 154 lb (69.9 kg)  04/22/18 153 lb 6.4 oz (69.6 kg)  04/10/18 155 lb 6.4 oz (70.5 kg)      Studies/Labs Reviewed:   EKG:  EKG is NOT ordered today.    Recent Labs: 04/05/2018: ALT <5; B Natriuretic Peptide 1,303.3 04/10/2018: Hemoglobin 11.2; Magnesium 2.1; Platelets 104 04/22/2018: BUN 29; Creatinine, Ser 1.51; Potassium 4.5; Sodium 146   Lipid Panel No results found for: CHOL, TRIG, HDL, CHOLHDL, VLDL, LDLCALC, LDLDIRECT  Additional studies/ records that were reviewed today include:  TAVR OPERATIVE NOTE   Date of Procedure:04/09/2018  Preoperative Diagnosis:Severe Aortic Stenosis   Procedure:   Transcatheter Aortic Valve Replacement - PercutaneousRightTransfemoral Approach Edwards Sapien 3 THV (size 71mm, model # 9600TFX, serial # [id number])  Co-Surgeons:Clarence H. Roxy Manns, MD and Lauree Chandler, MD  Pre-operative Echo Findings: ? Severe aortic stenosis ? Moderateleft ventricular systolic dysfunction  Post-operative Echo Findings: ? Noparavalvular leak ? Unchangedleft ventricular systolic function  ______________  Echo  04/10/18: Study Conclusions - Left ventricle: The cavity size was normal. Wall thickness was   increased in a pattern of moderate LVH. Basal to mid   inferolateral akinesis. Basal anterolateral akinesis.   Indeterminant diastolic function.  Systolic function was   moderately reduced. The estimated ejection fraction was in the   range of 35% to 40%. - Aortic valve: Bioprosthetic aortic valve s/p TAVR. No significant   bioprosthetic valve stenosis. There was no regurgitation. Mean   gradient (S): 9 mm Hg. Valve area (VTI): 1.48 cm^2. Valve area   (Vmax): 1.75 cm^2. - Mitral valve: Mildly calcified annulus. There was trivial   regurgitation. - Left atrium: The atrium was moderately dilated. - Right ventricle: The cavity size was normal. Pacer wire or   catheter noted in right ventricle. Systolic function was mildly   reduced. - Right atrium: The atrium was mildly dilated. - Tricuspid valve: Peak RV-RA gradient (S): 35 mm Hg. - Pulmonary arteries: PA peak pressure: 38 mm Hg (S). - Inferior vena cava: The vessel was normal in size. The   respirophasic diameter changes were in the normal range (>= 50%),   consistent with normal central venous pressure. Impressions: - Normal LV size with moderate LV hypertrophy. EF 35-40% with basal   to mid inferolateral and basal anterolateral akinesis. Normal RV   size with mildly decreased systolic function. Bioprosthetic   aortic valve s/p TAVR, no significant bioprosthetic valve   stenosis and no regurgitation noted.  _____________  Echo 05/23/18 Study Conclusions - Left ventricle: The cavity size was normal. Wall thickness was   normal. Systolic function was moderately reduced. The estimated   ejection fraction was in the range of 35% to 40%. Diffuse   hypokinesis. Doppler parameters are consistent with pseudonormal   left ventricular relaxation (grade 2 diastolic dysfunction). The   E/e&' ratio is >15, suggesting elevated LV filling  pressure. - Aortic valve: s/p 29 mm Edwards Sapien 3 THV. No obstruction or   paravalvular leak. Mean gradient (S): 5 mm Hg. Peak gradient (S):   9 mm Hg. - Mitral valve: Mildly thickened leaflets . There was mild to   moderate regurgitation. - Left atrium: Severely dilated. - Right ventricle: The cavity size was mildly dilated. Pacer wire   or catheter noted in right ventricle. Systolic function was   normal. - Right atrium: Moderately dilated. Pacer wire or catheter noted in   right atrium. - Tricuspid valve: There was moderate regurgitation. - Pulmonary arteries: PA peak pressure: 38 mm Hg (S). - Inferior vena cava: The vessel was normal in size. The   respirophasic diameter changes were in the normal range (>= 50%),   consistent with normal central venous pressure. Impressions:- Compared to a prior study in 04/2018, there have been no   significant changes.   ASSESSMENT & PLAN:   Severe AS s/p TAVR: echo today shows EF 35-40% (stable from previous), normally functioning TAVR valve with mean gradient of 5 mm Hg and no PVL. He has NYHA class I symptoms. SBE prophylaxis discussed; he doesn't go to the dentist; he has full dentures. ASA can be discontinued after 6 months of therapy (10/2018).  Chronic combined S/D CHF: EF remains ~35-40%. He appears euvolemic. Continue Lisinopril and Toprol XL. Continue lasix 40mg  daily.   HTN: BP well controlled today. On low side but asymptomatic.   S/p PPM: followed by Dr. Sallyanne Kuster  Chronic atrial fibrillation s/p AV node ablation: continue coumadin  CKD stage III: creat stable ~1.4 -1.6  Pancreatic lesions: noted on pre TAVR CT. Reviewed with Dr. Weber Cooks (reading radiologist). These are considered "stable" and "low-risk" from an imaging perspective in a patient this age, and require no future imaging follow-up.  Dispo: I usually  have patients see their primary cardiologist within 3-4 months of this visit but the patient is adamant that  he doesn't want to be seen again so soon. He has a recall with Dr. Sallyanne Kuster to check his device in July 2020. He will be due for his one year appointment with me in 04/2019 but would rather be seen in [place], which is closer to his home. He has seen Dr. Agustin Cree before and would like to follow with him. We will arrange for his one year echo and follow up to be with Dr. Agustin Cree in 04/2019.   Medication Adjustments/Labs and Tests Ordered: Current medicines are reviewed at length with the patient today.  Concerns regarding medicines are outlined above.  Medication changes, Labs and Tests ordered today are listed in the Patient Instructions below. Patient Instructions  Medication Instructions:  No changes--stop aspirin on 10/09/2018.  If you need a refill on your cardiac medications before your next appointment, please call your pharmacy.   Lab work: none If you have labs (blood work) drawn today and your tests are completely normal, you will receive your results only by: Marland Kitchen MyChart Message (if you have MyChart) OR . A paper copy in the mail If you have any lab test that is abnormal or we need to change your treatment, we will call you to review the results.  Testing/Procedures: none  Follow-Up: We will arrange 1 year follow up your primary cardiologist in [place]. You will get an echo at that time as well. Dr. Lurline Del office will plan to see you again in July.  Any Other Special Instructions Will Be Listed Below (If Applicable).       Signed, Angelena Form, PA-C  05/23/2018 2:33 PM    [facility] [address], [place], [place]  [address] Phone: [phone]; Fax: [phone]

## 2018-05-23 ENCOUNTER — Ambulatory Visit: Payer: Medicare Other | Admitting: Physician Assistant

## 2018-05-23 ENCOUNTER — Ambulatory Visit (HOSPITAL_COMMUNITY): Payer: Medicare Other | Attending: Internal Medicine

## 2018-05-23 ENCOUNTER — Encounter: Payer: Self-pay | Admitting: Physician Assistant

## 2018-05-23 ENCOUNTER — Other Ambulatory Visit: Payer: Self-pay

## 2018-05-23 VITALS — BP 98/76 | HR 69 | Ht 69.0 in | Wt 154.0 lb

## 2018-05-23 DIAGNOSIS — Z952 Presence of prosthetic heart valve: Secondary | ICD-10-CM | POA: Diagnosis not present

## 2018-05-23 DIAGNOSIS — I4821 Permanent atrial fibrillation: Secondary | ICD-10-CM

## 2018-05-23 DIAGNOSIS — I10 Essential (primary) hypertension: Secondary | ICD-10-CM | POA: Diagnosis not present

## 2018-05-23 DIAGNOSIS — K869 Disease of pancreas, unspecified: Secondary | ICD-10-CM

## 2018-05-23 DIAGNOSIS — I5042 Chronic combined systolic (congestive) and diastolic (congestive) heart failure: Secondary | ICD-10-CM | POA: Diagnosis not present

## 2018-05-23 DIAGNOSIS — N183 Chronic kidney disease, stage 3 unspecified: Secondary | ICD-10-CM

## 2018-05-23 NOTE — Patient Instructions (Addendum)
Medication Instructions:  No changes--stop aspirin on 10/09/2018.  If you need a refill on your cardiac medications before your next appointment, please call your pharmacy.   Lab work: none If you have labs (blood work) drawn today and your tests are completely normal, you will receive your results only by: Marland Kitchen MyChart Message (if you have MyChart) OR . A paper copy in the mail If you have any lab test that is abnormal or we need to change your treatment, we will call you to review the results.  Testing/Procedures: none  Follow-Up: We will arrange 1 year follow up your primary cardiologist in [place]. You will get an echo at that time as well. Dr. Lurline Del office will plan to see you again in July.  Any Other Special Instructions Will Be Listed Below (If Applicable).

## 2018-05-24 NOTE — Progress Notes (Signed)
Understood. Would also offer the option to see Dr. Curt Bears in [place] starting July 2020 and establish PM follow up with him, but I can check his remotes until then. MCr

## 2018-06-12 ENCOUNTER — Telehealth: Payer: Self-pay

## 2018-06-12 ENCOUNTER — Encounter: Payer: Self-pay | Admitting: Thoracic Surgery (Cardiothoracic Vascular Surgery)

## 2018-06-12 ENCOUNTER — Ambulatory Visit (INDEPENDENT_AMBULATORY_CARE_PROVIDER_SITE_OTHER): Payer: Medicare Other

## 2018-06-12 DIAGNOSIS — I4821 Permanent atrial fibrillation: Secondary | ICD-10-CM

## 2018-06-12 DIAGNOSIS — I420 Dilated cardiomyopathy: Secondary | ICD-10-CM | POA: Diagnosis not present

## 2018-06-12 NOTE — Progress Notes (Signed)
Remote pacemaker transmission.   

## 2018-06-12 NOTE — Telephone Encounter (Signed)
Spoke with pt and reminded pt of remote transmission that is due today. Pt verbalized understanding.   

## 2018-06-18 ENCOUNTER — Encounter: Payer: Self-pay | Admitting: Cardiology

## 2018-08-02 LAB — CUP PACEART REMOTE DEVICE CHECK
Battery Remaining Percentage: 80 %
Battery Voltage: 2.93 V
Date Time Interrogation Session: 20191204152558
Implantable Lead Implant Date: 20141226
Implantable Lead Location: 753860
Implantable Pulse Generator Implant Date: 20141226
Lead Channel Impedance Value: 430 Ohm
Lead Channel Impedance Value: 610 Ohm
Lead Channel Pacing Threshold Amplitude: 1 V
Lead Channel Pacing Threshold Pulse Width: 0.5 ms
Lead Channel Setting Pacing Pulse Width: 0.4 ms
Lead Channel Setting Sensing Sensitivity: 4 mV
MDC IDC LEAD IMPLANT DT: 20141226
MDC IDC LEAD LOCATION: 753858
MDC IDC MSMT BATTERY REMAINING LONGEVITY: 71 mo
MDC IDC MSMT LEADCHNL RV PACING THRESHOLD AMPLITUDE: 0.75 V
MDC IDC MSMT LEADCHNL RV PACING THRESHOLD PULSEWIDTH: 0.4 ms
MDC IDC MSMT LEADCHNL RV SENSING INTR AMPL: 12 mV
MDC IDC SET LEADCHNL LV PACING AMPLITUDE: 2.5 V
MDC IDC SET LEADCHNL LV PACING PULSEWIDTH: 0.5 ms
MDC IDC SET LEADCHNL RV PACING AMPLITUDE: 2.5 V
Pulse Gen Model: 3242
Pulse Gen Serial Number: 7547478

## 2018-09-11 ENCOUNTER — Ambulatory Visit (INDEPENDENT_AMBULATORY_CARE_PROVIDER_SITE_OTHER): Payer: Medicare Other | Admitting: *Deleted

## 2018-09-11 DIAGNOSIS — I4821 Permanent atrial fibrillation: Secondary | ICD-10-CM | POA: Diagnosis not present

## 2018-09-11 DIAGNOSIS — I428 Other cardiomyopathies: Secondary | ICD-10-CM

## 2018-09-12 ENCOUNTER — Telehealth: Payer: Self-pay

## 2018-09-12 NOTE — Telephone Encounter (Signed)
Spoke with patient to remind of missed remote transmission 

## 2018-09-14 LAB — CUP PACEART REMOTE DEVICE CHECK
Battery Remaining Longevity: 73 mo
Battery Remaining Percentage: 80 %
Battery Voltage: 2.93 V
Date Time Interrogation Session: 20200305153558
Implantable Lead Implant Date: 20141226
Implantable Lead Implant Date: 20141226
Implantable Lead Location: 753858
Implantable Lead Location: 753860
Implantable Pulse Generator Implant Date: 20141226
Lead Channel Impedance Value: 440 Ohm
Lead Channel Impedance Value: 700 Ohm
Lead Channel Pacing Threshold Amplitude: 0.75 V
Lead Channel Pacing Threshold Amplitude: 1 V
Lead Channel Pacing Threshold Pulse Width: 0.4 ms
Lead Channel Pacing Threshold Pulse Width: 0.5 ms
Lead Channel Sensing Intrinsic Amplitude: 12 mV
Lead Channel Setting Pacing Amplitude: 2.5 V
Lead Channel Setting Pacing Amplitude: 2.5 V
Lead Channel Setting Pacing Pulse Width: 0.5 ms
Lead Channel Setting Sensing Sensitivity: 4 mV
MDC IDC SET LEADCHNL RV PACING PULSEWIDTH: 0.4 ms
Pulse Gen Serial Number: 7547478

## 2018-09-18 NOTE — Progress Notes (Signed)
Remote pacemaker transmission.   

## 2018-10-23 ENCOUNTER — Other Ambulatory Visit: Payer: Self-pay | Admitting: Physician Assistant

## 2018-12-11 ENCOUNTER — Encounter: Payer: Medicare Other | Admitting: *Deleted

## 2018-12-12 ENCOUNTER — Telehealth: Payer: Self-pay

## 2018-12-12 NOTE — Telephone Encounter (Signed)
Left message for patient to remind of missed remote transmission.  

## 2018-12-19 ENCOUNTER — Encounter: Payer: Self-pay | Admitting: Cardiology

## 2018-12-26 ENCOUNTER — Ambulatory Visit (INDEPENDENT_AMBULATORY_CARE_PROVIDER_SITE_OTHER): Payer: Medicare Other | Admitting: *Deleted

## 2018-12-26 DIAGNOSIS — I4821 Permanent atrial fibrillation: Secondary | ICD-10-CM | POA: Diagnosis not present

## 2018-12-27 LAB — CUP PACEART REMOTE DEVICE CHECK
Battery Remaining Longevity: 67 mo
Battery Remaining Percentage: 71 %
Battery Voltage: 2.92 V
Date Time Interrogation Session: 20200618153209
Implantable Lead Implant Date: 20141226
Implantable Lead Implant Date: 20141226
Implantable Lead Location: 753858
Implantable Lead Location: 753860
Implantable Pulse Generator Implant Date: 20141226
Lead Channel Impedance Value: 430 Ohm
Lead Channel Impedance Value: 780 Ohm
Lead Channel Pacing Threshold Amplitude: 0.75 V
Lead Channel Pacing Threshold Amplitude: 1 V
Lead Channel Pacing Threshold Pulse Width: 0.4 ms
Lead Channel Pacing Threshold Pulse Width: 0.5 ms
Lead Channel Sensing Intrinsic Amplitude: 11.7 mV
Lead Channel Setting Pacing Amplitude: 2.5 V
Lead Channel Setting Pacing Amplitude: 2.5 V
Lead Channel Setting Pacing Pulse Width: 0.4 ms
Lead Channel Setting Pacing Pulse Width: 0.5 ms
Lead Channel Setting Sensing Sensitivity: 4 mV
Pulse Gen Model: 3242
Pulse Gen Serial Number: 7547478

## 2018-12-28 ENCOUNTER — Other Ambulatory Visit: Payer: Self-pay | Admitting: Cardiology

## 2018-12-30 NOTE — Progress Notes (Signed)
Remote pacemaker transmission.   

## 2018-12-30 NOTE — Telephone Encounter (Signed)
Lisinopril refill sent to [facility] on [address] Tia Alert

## 2019-03-03 ENCOUNTER — Other Ambulatory Visit: Payer: Self-pay | Admitting: Physician Assistant

## 2019-03-03 DIAGNOSIS — Z952 Presence of prosthetic heart valve: Secondary | ICD-10-CM

## 2019-03-05 ENCOUNTER — Other Ambulatory Visit (HOSPITAL_COMMUNITY): Payer: Medicare Other

## 2019-03-06 ENCOUNTER — Telehealth: Payer: Medicare Other | Admitting: Physician Assistant

## 2019-03-26 ENCOUNTER — Other Ambulatory Visit: Payer: Self-pay

## 2019-03-26 ENCOUNTER — Ambulatory Visit (INDEPENDENT_AMBULATORY_CARE_PROVIDER_SITE_OTHER): Payer: Medicare Other

## 2019-03-26 DIAGNOSIS — Z952 Presence of prosthetic heart valve: Secondary | ICD-10-CM

## 2019-03-26 NOTE — Progress Notes (Signed)
Complete echocardiogram has been performed.  Jimmy Urho Rio RDCS, RVT 

## 2019-03-27 ENCOUNTER — Telehealth: Payer: Self-pay | Admitting: *Deleted

## 2019-03-27 ENCOUNTER — Telehealth (INDEPENDENT_AMBULATORY_CARE_PROVIDER_SITE_OTHER): Payer: Medicare Other | Admitting: Physician Assistant

## 2019-03-27 VITALS — BP 157/82 | Ht 69.0 in | Wt 155.0 lb

## 2019-03-27 DIAGNOSIS — Z952 Presence of prosthetic heart valve: Secondary | ICD-10-CM

## 2019-03-27 DIAGNOSIS — I5042 Chronic combined systolic (congestive) and diastolic (congestive) heart failure: Secondary | ICD-10-CM

## 2019-03-27 DIAGNOSIS — I10 Essential (primary) hypertension: Secondary | ICD-10-CM

## 2019-03-27 DIAGNOSIS — N183 Chronic kidney disease, stage 3 unspecified: Secondary | ICD-10-CM

## 2019-03-27 MED ORDER — LISINOPRIL 5 MG PO TABS: 5.0000 mg | ORAL_TABLET | Freq: Every day | ORAL | 3 refills | Status: DC

## 2019-03-27 NOTE — Progress Notes (Signed)
HEART AND VASCULAR CENTER   MULTIDISCIPLINARY HEART VALVE TEAM   Virtual Visit via Telephone Note   This visit type was conducted due to national recommendations for restrictions regarding the COVID-19 Pandemic (e.g. social distancing) in an effort to limit this patient's exposure and mitigate transmission in our community.  Due to his co-morbid illnesses, this patient is at least at moderate risk for complications without adequate follow up.  This format is felt to be most appropriate for this patient at this time.  The patient did not have access to video technology/had technical difficulties with video requiring transitioning to audio format only (telephone).  All issues noted in this document were discussed and addressed.  No physical exam could be performed with this format.  Please refer to the patient's chart for his  consent to telehealth for [facility].   Evaluation Performed:  Follow-up visit  Date:  03/27/2019   ID:  [name] [name], DOB [birth year], MRN [id number]  Patient Location: Home Provider Location: Office  PCP:  Nicoletta Dress, MD  Cardiologist: Dr. Agustin Cree ( wants to follow in [place] ) / Dr. Angelena Form & Dr. Roxy Manns (TAVR) Electrophysiologist: Dr. Sallyanne Kuster --> Dr. Curt Bears in [place]  Chief Complaint:  1 year s/p TAVR  History of Present Illness:    [name] [name] is a 85 y.o. male with a history of HTN, SSS s/p BiV PPM, NICM, HTN, CKD stage III, chronic combined S/D CHF, afib on coumadin, DMT2, legal blindness and severe AS s/p TAVR (04/09/18) who presents via telemedicine for follow up.  The patient does not have symptoms concerning for COVID-19 infection (fever, chills, cough, or new shortness of breath).   Patient's cardiac history dates back to 1995 when he originally presented with atrial fibrillation. He was followed for many years by Dr. Rollene Fare and more recently has been followed by Dr. Sallyanne Kuster.He has been on long-term warfarin anticoagulation and  underwent AV node ablation with permanent pacemaker placement.He had a total of 3 pacemakers, most recently 2014 when his device was upgraded to biventricular pacer. He was noted to have aortic stenosis that initially was mild to moderate but has gradually increase in severity. He was seen in follow-up by Dr. Tylene Fantasia July and noted to complain of exertional shortness of breath and episodes of dizziness that occur with exertion. A follow-up echocardiogram was performed February 25, 2018 and revealed critical aortic stenosis. Peak velocity across aortic valve was measured 5.2 m/s corresponding to mean transvalvular gradient estimated 70 mmHg. Left ventricular ejection fraction was estimated 35 to 40%.Catheterization was performed March 06, 2018 and confirmed the presence of severe aortic stenosis. Mean transvalvular gradient was measured 49.9 mmHg at catheterization, corresponding to aortic valve area 0.72 cm. There was mild nonobstructive coronary artery disease and moderate pulmonary hypertension.  He was evaluated by the multidisciplinary valve team and underwent successful TAVR with a78mm Edwards Sapien 3 THV via the TF approach on 04/09/18. Post op echo showed EF 35-40%, normally functioning TAVR valve; mean gradient 9 mm Hg and no PVL. He was discharged on ASA 81mg  daily and resumed on home coumadin.  He has done quite well in follow up with an improvement in stamina and dizziness. Today he presents via telemedicine for follow up.    Past Medical History:  Diagnosis Date  . Atrial fibrillation (HCC)    echo- EF 35-40%; LA severely dilated; RA mod dilated, RV systolic pressure 60FUXN; LV systolic fcn mod reduced  . Cancer ([place])    skin cancer  .  CHF (congestive heart failure) ([place])   . Diabetes mellitus without complication ([place])   . Hypertension   . Presence of permanent cardiac pacemaker   . 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   29 mm Edwards Sapien 3  transcatheter heart valve placed via percutaneous right transfemoral approach   . Severe aortic stenosis   . SSS (sick sinus syndrome) ([place])    pacemaker placed in 1993  . Tremors of nervous system    Past Surgical History:  Procedure Laterality Date  . AV NODE ABLATION Bilateral 07/04/2013   Procedure: AV NODE ABLATION;  Surgeon: Evans Lance, MD;  Location: [facility] CATH LAB;  Service: Cardiovascular;  Laterality: Bilateral;  . BI-VENTRICULAR PACEMAKER INSERTION Bilateral 07/04/2013   Procedure: BI-VENTRICULAR PACEMAKER INSERTION (CRT-P);  Surgeon: Evans Lance, MD;  Location: [facility] CATH LAB;  Service: Cardiovascular;  Laterality: Bilateral;  . BIV PACEMAKER GENERATOR CHANGE OUT  11/2004  . COLONOSCOPY    . HERNIA REPAIR     left groin  . INSERT / REPLACE / REMOVE PACEMAKER    . INTRAOPERATIVE TRANSTHORACIC ECHOCARDIOGRAM N/A 04/09/2018   Procedure: INTRAOPERATIVE TRANSTHORACIC ECHOCARDIOGRAM;  Surgeon: Burnell Blanks, MD;  Location: [place];  Service: Open Heart Surgery;  Laterality: N/A;  . LEFT HEART CATHETERIZATION WITH CORONARY ANGIOGRAM N/A 07/07/2013   Procedure: LEFT HEART CATHETERIZATION WITH CORONARY ANGIOGRAM;  Surgeon: Pixie Casino, MD;  Location: [facility] CATH LAB;  Service: Cardiovascular;  Laterality: N/A;  . PACEMAKER INSERTION  1993  . RIGHT/LEFT HEART CATH AND CORONARY ANGIOGRAPHY N/A 03/06/2018   Procedure: RIGHT/LEFT HEART CATH AND CORONARY ANGIOGRAPHY;  Surgeon: Burnell Blanks, MD;  Location: [place] CV LAB;  Service: Cardiovascular;  Laterality: N/A;  . TONSILLECTOMY     adenoidectomy  . TRANSCATHETER AORTIC VALVE REPLACEMENT, TRANSFEMORAL  04/09/2018  . TRANSCATHETER AORTIC VALVE REPLACEMENT, TRANSFEMORAL Right 04/09/2018   Procedure: TRANSCATHETER AORTIC VALVE REPLACEMENT, TRANSFEMORAL. 62mm Edwards Sapien 3 THV.;  Surgeon: Burnell Blanks, MD;  Location: [place];  Service: Open Heart Surgery;  Laterality: Right;     Current Meds  Medication  Sig  . acetaminophen (TYLENOL) 325 MG tablet Take 1-2 tablets (325-650 mg total) by mouth every 4 (four) hours as needed for mild pain.  . carbidopa-levodopa (SINEMET IR) 25-100 MG per tablet Take 1 tablet by mouth 3 (three) times daily.  . diphenhydrAMINE (BENADRYL) 25 mg capsule Take 25 mg by mouth at bedtime as needed for allergies or sleep.  . furosemide (LASIX) 40 MG tablet Take 40 mg by mouth daily.  Marland Kitchen linagliptin (TRADJENTA) 5 MG TABS tablet Take 5 mg by mouth daily.  . metoprolol succinate (TOPROL-XL) 25 MG 24 hr tablet Take 25 mg by mouth daily. Take with or immediately following a meal.   . Multiple Vitamins-Minerals (PRESERVISION AREDS 2) CAPS Take 1 capsule by mouth 2 (two) times daily.   . Omega-3 Fatty Acids (FISH OIL) 1200 MG CAPS Take 1,200 mg by mouth 2 (two) times daily.  . vitamin E 400 UNIT capsule Take 400 Units by mouth daily.  Marland Kitchen warfarin (COUMADIN) 5 MG tablet Take 5 mg every day except take 2.5 mg on Monday and Friday  . [DISCONTINUED] lisinopril (ZESTRIL) 5 MG tablet TAKE 1/2 TABLET BY MOUTH ONCE DAILY     Allergies:   Amiodarone   Social History   Tobacco Use  . Smoking status: Former Smoker    Quit date: 07/09/1969    Years since quitting: 49.7  . Smokeless tobacco: Never Used  Substance Use Topics  . Alcohol use: No  . Drug use: No     Family Hx: The patient's family history includes Asthma in his father; Coronary artery disease in his mother; Stroke in his mother.  ROS:   Please see the history of present illness.    All other systems reviewed and are negative.   Prior CV studies:   The following studies were reviewed today:  TAVR OPERATIVE NOTE   Date of Procedure:04/09/2018  Preoperative Diagnosis:Severe Aortic Stenosis   Procedure:   Transcatheter Aortic Valve Replacement - PercutaneousRightTransfemoral Approach Edwards Sapien 3 THV (size 19mm, model # 9600TFX, serial #  [id number])  Co-Surgeons:Clarence H. Roxy Manns, MD and Lauree Chandler, MD  Pre-operative Echo Findings: ? Severe aortic stenosis ? Moderateleft ventricular systolic dysfunction  Post-operative Echo Findings: ? Noparavalvular leak ? Unchangedleft ventricular systolic function  ______________  Echo 05/23/18 Study Conclusions - Left ventricle: The cavity size was normal. Wall thickness was normal. Systolic function was moderately reduced. The estimated ejection fraction was in the range of 35% to 40%. Diffuse hypokinesis. Doppler parameters are consistent with pseudonormal left ventricular relaxation (grade 2 diastolic dysfunction). The E/e&' ratio is >15, suggesting elevated LV filling pressure. - Aortic valve: s/p 29 mm Edwards Sapien 3 THV. No obstruction or paravalvular leak. Mean gradient (S): 5 mm Hg. Peak gradient (S): 9 mm Hg. - Mitral valve: Mildly thickened leaflets . There was mild to moderate regurgitation. - Left atrium: Severely dilated. - Right ventricle: The cavity size was mildly dilated. Pacer wire or catheter noted in right ventricle. Systolic function was normal. - Right atrium: Moderately dilated. Pacer wire or catheter noted in right atrium. - Tricuspid valve: There was moderate regurgitation. - Pulmonary arteries: PA peak pressure: 38 mm Hg (S). - Inferior vena cava: The vessel was normal in size. The respirophasic diameter changes were in the normal range (>= 50%), consistent with normal central venous pressure. Impressions:- Compared to a prior study in 04/2018, there have been no significant changes.  _____________   Echo 03/26/19 IMPRESSIONS  1. The left ventricle has moderately reduced systolic function, with an ejection fraction of 35-40%. The cavity size was normal. There is mild concentric left ventricular hypertrophy. Left ventricular diastolic Doppler parameters are consistent with   pseudonormalization. Elevated mean left atrial pressure Left ventricular diffuse hypokinesis (Grade II Diastolic Dysfunction).  2. The is abnormal LV global longitudinal strain ( -8.1%).  3. The bioprosthetic aortic valve is well seated. Status post 29 mm Edward Sapien 3. There is no evidence or regurgitation or paravalvular leaks. There is no stenosis: velocity 1.6 m/s, Mean gradient 6 mmHG, Peak gradient 10 mmHG and DVI 0.52.  4. The MR jet is centrally-directed. No evidence of mitral valve stenosis.  5. The aorta is normal unless otherwise noted.  6. Tricuspid valve regurgitation is mild-moderate.  7. RVSP is mildy elevated.  8. Left atrial size was severely dilated.  Labs/Other Tests and Data Reviewed:    EKG:  No ECG reviewed.  Recent Labs: 04/05/2018: ALT <5; B Natriuretic Peptide 1,303.3 04/10/2018: Hemoglobin 11.2; Magnesium 2.1; Platelets 104 04/22/2018: BUN 29; Creatinine, Ser 1.51; Potassium 4.5; Sodium 146   Recent Lipid Panel No results found for: CHOL, TRIG, HDL, CHOLHDL, LDLCALC, LDLDIRECT  Wt Readings from Last 3 Encounters:  03/27/19 155 lb (70.3 kg)  05/23/18 154 lb (69.9 kg)  04/22/18 153 lb 6.4 oz (69.6 kg)     Objective:    Vital Signs:  BP (!) 157/82   Ht 5'  9" (1.753 m)   Wt 155 lb (70.3 kg)   BMI 22.89 kg/m     ASSESSMENT & PLAN:    Severe AS s/p TAVR: echo yesterday showed EF 35-40%, normally functioning TAVR with mean gradient of 6 mm Hg and no PVL. He has NYHA class I symptoms. SBE prophylaxis discussed; the patient is edentulous and does not go to the dentist. He is on Coumadin for chronic afib.  Chronic combined S/D CHF: EF remains 35-40%. Continue on Lisinopril, Torprol XL and lasix 40mg  daily. No s/s CHF.   HTN: BP elevated today. Plan to increase his Lisinopril from 2.5mg  to 5 mg daily. I asked him to get follow up lab work but he would like to wait to when he see his PCP in 1.5 months. I think that's okay. He will have this faxed to [place].    CKD stage III: creat baseline 1.4-1.6. will follow labs with increase in lisinopril.   S/p PPM: followed by Dr. Sallyanne Kuster but he would like transfer care to Dr. Curt Bears out in [place]. He is probably overdue for a pacer check up. I will send a message to his schedulers to get this arranged.   Chronicatrial fibrillations/p AV node ablation: continue coumadin, followed by his PCP  COVID-19 Education: The signs and symptoms of COVID-19 were discussed with the patient and how to seek care for testing (follow up with PCP or arrange E-visit).  The importance of social distancing was discussed today.  Time:   Today, I have spent 15 minutes with the patient with telehealth technology discussing the above problems.     Medication Adjustments/Labs and Tests Ordered: Current medicines are reviewed at length with the patient today.  Concerns regarding medicines are outlined above.   Tests Ordered: No orders of the defined types were placed in this encounter.   Medication Changes: Meds ordered this encounter  Medications  . lisinopril (ZESTRIL) 5 MG tablet    Sig: Take 1 tablet (5 mg total) by mouth daily.    Dispense:  90 tablet    Refill:  3    Dose increase    Disposition:  Follow up prn with structural heart. Please see Dr. Posey Rea in 1 year and Dr Curt Bears during his next availability in [place].  Signed, Angelena Form, PA-C  03/27/2019 3:22 PM    [facility]

## 2019-03-27 NOTE — Patient Instructions (Signed)
Medication Instructions:  Your physician has recommended you make the following change in your medication: Increase lisinopril to 5 mg by mouth daily.   If you need a refill on your cardiac medications before your next appointment, please call your pharmacy.   Lab work: Have lab work checked when you see your primary care doctor next month and have results faxed to our office--[phone]  Testing/Procedures: none  Follow-Up: At [facility], you and your health needs are our priority.  As part of our continuing mission to provide you with exceptional heart care, we have created designated Provider Care Teams.  These Care Teams include your primary Cardiologist (physician) and Advanced Practice Providers (APPs -  Physician Assistants and Nurse Practitioners) who all work together to provide you with the care you need, when you need it. You will need a follow up appointment in 12 months.  Please call our office 2 months in advance to schedule this appointment.  You may see Dr. Posey Rea or another member of our [place] Provider Team in [place]:   We will arrange follow up with Dr. Curt Bears in [place] .

## 2019-03-27 NOTE — Telephone Encounter (Signed)
Virtual Visit Pre-Appointment Phone Call  "(Name), I am calling you today to discuss your upcoming appointment. We are currently trying to limit exposure to the virus that causes COVID-19 by seeing patients at home rather than in the office."  1. "What is the BEST phone number to call the day of the visit?" - include this in appointment notes--[phone]  2. "Do you have or have access to (through a family member/friend) a smartphone with video capability that we can use for your visit?" no a. If yes - list this number in appt notes as "cell" (if different from BEST phone #) and list the appointment type as a VIDEO visit in appointment notes b. If no - list the appointment type as a PHONE visit in appointment notes  3. Confirm consent - "In the setting of the current Covid19 crisis, you are scheduled for a phone visit with your provider on 03/27/19 at 2;30.  Just as we do with many in-office visits, in order for you to participate in this visit, we must obtain consent.  If you'd like, I can send this to your mychart (if signed up) or email for you to review.  Otherwise, I can obtain your verbal consent now.  All virtual visits are billed to your insurance company just like a normal visit would be.  By agreeing to a virtual visit, we'd like you to understand that the technology does not allow for your provider to perform an examination, and thus may limit your provider's ability to fully assess your condition. If your provider identifies any concerns that need to be evaluated in person, we will make arrangements to do so.  Finally, though the technology is pretty good, we cannot assure that it will always work on either your or our end, and in the setting of a video visit, we may have to convert it to a phone-only visit.  In either situation, we cannot ensure that we have a secure connection.  Are you willing to proceed?" STAFF: Did the patient verbally acknowledge consent to telehealth visit?  Document YES/NO here: yes  4. Advise patient to be prepared - "Two hours prior to your appointment, go ahead and check your blood pressure, pulse, oxygen saturation, and your weight (if you have the equipment to check those) and write them all down. When your visit starts, your provider will ask you for this information. If you have an Apple Watch or Kardia device, please plan to have heart rate information ready on the day of your appointment. Please have a pen and paper handy nearby the day of the visit as well."  5. Give patient instructions for MyChart download to smartphone OR [email or web address] as below if video visit (depending on what platform provider is using)  6. Inform patient they will receive a phone call 15 minutes prior to their appointment time (may be from unknown caller ID) so they should be prepared to answer    TELEPHONE CALL NOTE  [name] [name] has been deemed a candidate for a follow-up tele-health visit to limit community exposure during the Covid-19 pandemic. I spoke with the patient via phone to ensure availability of phone/video source, confirm preferred email & phone number, and discuss instructions and expectations.  I reminded [name] [name] to be prepared with any vital sign and/or heart rhythm information that could potentially be obtained via home monitoring, at the time of his visit. I reminded [name] [name] to expect a phone call prior to his  visit.  Leodis Liverpool, RN 03/27/2019 2:10 PM   INSTRUCTIONS FOR DOWNLOADING THE MYCHART APP TO SMARTPHONE  - The patient must first make sure to have activated MyChart and know their login information - If Apple, go to [employer] and type in MyChart in the search bar and download the app. If Android, ask patient to go to [employer] and type in [place] in the search bar and download the app. The app is free but as with any other app downloads, their phone may require them to verify saved payment information or  Apple/Android password.  - The patient will need to then log into the app with their MyChart username and password, and select [facility] as their healthcare provider to link the account. When it is time for your visit, go to the MyChart app, find appointments, and click Begin Video Visit. Be sure to Select Allow for your device to access the Microphone and Camera for your visit. You will then be connected, and your provider will be with you shortly.  **If they have any issues connecting, or need assistance please contact MyChart service desk [phone])**  **If using a computer, in order to ensure the best quality for their visit they will need to use either of the following Internet Browsers: [employer], or Google Chrome**  IF USING DOXIMITY or DOXY.ME - The patient will receive a link just prior to their visit by text.     FULL LENGTH CONSENT FOR TELE-HEALTH VISIT   I hereby voluntarily request, consent and authorize [facility] and its employed or contracted physicians, physician assistants, nurse practitioners or other licensed health care professionals (the Practitioner), to provide me with telemedicine health care services (the "Services") as deemed necessary by the treating Practitioner. I acknowledge and consent to receive the Services by the Practitioner via telemedicine. I understand that the telemedicine visit will involve communicating with the Practitioner through live audiovisual communication technology and the disclosure of certain medical information by electronic transmission. I acknowledge that I have been given the opportunity to request an in-person assessment or other available alternative prior to the telemedicine visit and am voluntarily participating in the telemedicine visit.  I understand that I have the right to withhold or withdraw my consent to the use of telemedicine in the course of my care at any time, without affecting my right to future care  or treatment, and that the Practitioner or I may terminate the telemedicine visit at any time. I understand that I have the right to inspect all information obtained and/or recorded in the course of the telemedicine visit and may receive copies of available information for a reasonable fee.  I understand that some of the potential risks of receiving the Services via telemedicine include:  Marland Kitchen Delay or interruption in medical evaluation due to technological equipment failure or disruption; . Information transmitted may not be sufficient (e.g. poor resolution of images) to allow for appropriate medical decision making by the Practitioner; and/or  . In rare instances, security protocols could fail, causing a breach of personal health information.  Furthermore, I acknowledge that it is my responsibility to provide information about my medical history, conditions and care that is complete and accurate to the best of my ability. I acknowledge that Practitioner's advice, recommendations, and/or decision may be based on factors not within their control, such as incomplete or inaccurate data provided by me or distortions of diagnostic images or specimens that may result from electronic transmissions. I understand  that the practice of medicine is not an exact science and that Practitioner makes no warranties or guarantees regarding treatment outcomes. I acknowledge that I will receive a copy of this consent concurrently upon execution via email to the email address I last provided but may also request a printed copy by calling the office of [facility].    I understand that my insurance will be billed for this visit.   I have read or had this consent read to me. . I understand the contents of this consent, which adequately explains the benefits and risks of the Services being provided via telemedicine.  . I have been provided ample opportunity to ask questions regarding this consent and the Services and have had  my questions answered to my satisfaction. . I give my informed consent for the services to be provided through the use of telemedicine in my medical care  By participating in this telemedicine visit I agree to the above.

## 2019-03-28 ENCOUNTER — Ambulatory Visit (INDEPENDENT_AMBULATORY_CARE_PROVIDER_SITE_OTHER): Payer: Medicare Other | Admitting: *Deleted

## 2019-03-28 DIAGNOSIS — I4821 Permanent atrial fibrillation: Secondary | ICD-10-CM

## 2019-03-28 LAB — CUP PACEART REMOTE DEVICE CHECK
Battery Remaining Longevity: 58 mo
Battery Remaining Percentage: 63 %
Battery Voltage: 2.9 V
Date Time Interrogation Session: 20200918142843
Implantable Lead Implant Date: 20141226
Implantable Lead Implant Date: 20141226
Implantable Lead Location: 753858
Implantable Lead Location: 753860
Implantable Pulse Generator Implant Date: 20141226
Lead Channel Impedance Value: 400 Ohm
Lead Channel Impedance Value: 750 Ohm
Lead Channel Pacing Threshold Amplitude: 0.75 V
Lead Channel Pacing Threshold Amplitude: 1 V
Lead Channel Pacing Threshold Pulse Width: 0.4 ms
Lead Channel Pacing Threshold Pulse Width: 0.5 ms
Lead Channel Sensing Intrinsic Amplitude: 10.9 mV
Lead Channel Setting Pacing Amplitude: 2.5 V
Lead Channel Setting Pacing Amplitude: 2.5 V
Lead Channel Setting Pacing Pulse Width: 0.4 ms
Lead Channel Setting Pacing Pulse Width: 0.5 ms
Lead Channel Setting Sensing Sensitivity: 4 mV
Pulse Gen Model: 3242
Pulse Gen Serial Number: 7547478

## 2019-03-31 ENCOUNTER — Encounter: Payer: Self-pay | Admitting: Cardiology

## 2019-03-31 NOTE — Progress Notes (Signed)
Remote pacemaker transmission.   

## 2019-06-27 ENCOUNTER — Ambulatory Visit (INDEPENDENT_AMBULATORY_CARE_PROVIDER_SITE_OTHER): Payer: Medicare Other | Admitting: *Deleted

## 2019-06-27 DIAGNOSIS — Z950 Presence of cardiac pacemaker: Secondary | ICD-10-CM | POA: Diagnosis not present

## 2019-06-27 LAB — CUP PACEART REMOTE DEVICE CHECK
Battery Remaining Longevity: 59 mo
Battery Remaining Percentage: 63 %
Battery Voltage: 2.9 V
Date Time Interrogation Session: 20201218112437
Implantable Lead Implant Date: 20141226
Implantable Lead Implant Date: 20141226
Implantable Lead Location: 753858
Implantable Lead Location: 753860
Implantable Pulse Generator Implant Date: 20141226
Lead Channel Impedance Value: 430 Ohm
Lead Channel Impedance Value: 790 Ohm
Lead Channel Pacing Threshold Amplitude: 0.75 V
Lead Channel Pacing Threshold Amplitude: 1 V
Lead Channel Pacing Threshold Pulse Width: 0.4 ms
Lead Channel Pacing Threshold Pulse Width: 0.5 ms
Lead Channel Sensing Intrinsic Amplitude: 6.4 mV
Lead Channel Setting Pacing Amplitude: 2.5 V
Lead Channel Setting Pacing Amplitude: 2.5 V
Lead Channel Setting Pacing Pulse Width: 0.4 ms
Lead Channel Setting Pacing Pulse Width: 0.5 ms
Lead Channel Setting Sensing Sensitivity: 4 mV
Pulse Gen Model: 3242
Pulse Gen Serial Number: 7547478

## 2019-07-14 DIAGNOSIS — R221 Localized swelling, mass and lump, neck: Secondary | ICD-10-CM | POA: Diagnosis not present

## 2019-07-14 DIAGNOSIS — J342 Deviated nasal septum: Secondary | ICD-10-CM | POA: Diagnosis not present

## 2019-07-14 DIAGNOSIS — Z808 Family history of malignant neoplasm of other organs or systems: Secondary | ICD-10-CM | POA: Diagnosis not present

## 2019-07-16 NOTE — Progress Notes (Signed)
PPM remote 

## 2019-07-28 DIAGNOSIS — Z7901 Long term (current) use of anticoagulants: Secondary | ICD-10-CM | POA: Diagnosis not present

## 2019-08-27 DIAGNOSIS — Z7901 Long term (current) use of anticoagulants: Secondary | ICD-10-CM | POA: Diagnosis not present

## 2019-09-03 DIAGNOSIS — R791 Abnormal coagulation profile: Secondary | ICD-10-CM | POA: Diagnosis not present

## 2019-09-26 ENCOUNTER — Ambulatory Visit (INDEPENDENT_AMBULATORY_CARE_PROVIDER_SITE_OTHER): Payer: Medicare PPO | Admitting: *Deleted

## 2019-09-26 DIAGNOSIS — Z950 Presence of cardiac pacemaker: Secondary | ICD-10-CM | POA: Diagnosis not present

## 2019-09-26 LAB — CUP PACEART REMOTE DEVICE CHECK
Battery Remaining Longevity: 52 mo
Battery Remaining Percentage: 56 %
Battery Voltage: 2.89 V
Date Time Interrogation Session: 20210319105532
Implantable Lead Implant Date: 20141226
Implantable Lead Implant Date: 20141226
Implantable Lead Location: 753858
Implantable Lead Location: 753860
Implantable Pulse Generator Implant Date: 20141226
Lead Channel Impedance Value: 430 Ohm
Lead Channel Impedance Value: 750 Ohm
Lead Channel Pacing Threshold Amplitude: 0.75 V
Lead Channel Pacing Threshold Amplitude: 1 V
Lead Channel Pacing Threshold Pulse Width: 0.4 ms
Lead Channel Pacing Threshold Pulse Width: 0.5 ms
Lead Channel Sensing Intrinsic Amplitude: 9 mV
Lead Channel Setting Pacing Amplitude: 2.5 V
Lead Channel Setting Pacing Amplitude: 2.5 V
Lead Channel Setting Pacing Pulse Width: 0.4 ms
Lead Channel Setting Pacing Pulse Width: 0.5 ms
Lead Channel Setting Sensing Sensitivity: 4 mV
Pulse Gen Model: 3242
Pulse Gen Serial Number: 7547478

## 2019-09-26 NOTE — Progress Notes (Signed)
PPM Remote  

## 2019-10-01 DIAGNOSIS — R791 Abnormal coagulation profile: Secondary | ICD-10-CM | POA: Diagnosis not present

## 2019-10-31 DIAGNOSIS — Z7901 Long term (current) use of anticoagulants: Secondary | ICD-10-CM | POA: Diagnosis not present

## 2019-11-13 DIAGNOSIS — Z79899 Other long term (current) drug therapy: Secondary | ICD-10-CM | POA: Diagnosis not present

## 2019-11-13 DIAGNOSIS — E1129 Type 2 diabetes mellitus with other diabetic kidney complication: Secondary | ICD-10-CM | POA: Diagnosis not present

## 2019-11-13 DIAGNOSIS — E1165 Type 2 diabetes mellitus with hyperglycemia: Secondary | ICD-10-CM | POA: Diagnosis not present

## 2019-11-13 DIAGNOSIS — E785 Hyperlipidemia, unspecified: Secondary | ICD-10-CM | POA: Diagnosis not present

## 2019-11-13 DIAGNOSIS — I482 Chronic atrial fibrillation, unspecified: Secondary | ICD-10-CM | POA: Diagnosis not present

## 2019-11-13 DIAGNOSIS — I5022 Chronic systolic (congestive) heart failure: Secondary | ICD-10-CM | POA: Diagnosis not present

## 2019-11-13 DIAGNOSIS — R809 Proteinuria, unspecified: Secondary | ICD-10-CM | POA: Diagnosis not present

## 2019-12-01 DIAGNOSIS — Z7901 Long term (current) use of anticoagulants: Secondary | ICD-10-CM | POA: Diagnosis not present

## 2019-12-26 ENCOUNTER — Ambulatory Visit (INDEPENDENT_AMBULATORY_CARE_PROVIDER_SITE_OTHER): Payer: Medicare PPO | Admitting: *Deleted

## 2019-12-26 DIAGNOSIS — I4821 Permanent atrial fibrillation: Secondary | ICD-10-CM | POA: Diagnosis not present

## 2019-12-29 LAB — CUP PACEART REMOTE DEVICE CHECK
Battery Remaining Longevity: 42 mo
Battery Remaining Percentage: 44 %
Battery Voltage: 2.86 V
Date Time Interrogation Session: 20210618085447
Implantable Lead Implant Date: 20141226
Implantable Lead Implant Date: 20141226
Implantable Lead Location: 753858
Implantable Lead Location: 753860
Implantable Pulse Generator Implant Date: 20141226
Lead Channel Impedance Value: 450 Ohm
Lead Channel Impedance Value: 780 Ohm
Lead Channel Pacing Threshold Amplitude: 0.75 V
Lead Channel Pacing Threshold Amplitude: 1 V
Lead Channel Pacing Threshold Pulse Width: 0.4 ms
Lead Channel Pacing Threshold Pulse Width: 0.5 ms
Lead Channel Sensing Intrinsic Amplitude: 7.5 mV
Lead Channel Setting Pacing Amplitude: 2.5 V
Lead Channel Setting Pacing Amplitude: 2.5 V
Lead Channel Setting Pacing Pulse Width: 0.4 ms
Lead Channel Setting Pacing Pulse Width: 0.5 ms
Lead Channel Setting Sensing Sensitivity: 4 mV
Pulse Gen Model: 3242
Pulse Gen Serial Number: 7547478

## 2019-12-29 NOTE — Progress Notes (Signed)
Remote pacemaker transmission.   

## 2020-01-01 DIAGNOSIS — Z7901 Long term (current) use of anticoagulants: Secondary | ICD-10-CM | POA: Diagnosis not present

## 2020-01-15 DIAGNOSIS — R791 Abnormal coagulation profile: Secondary | ICD-10-CM | POA: Diagnosis not present

## 2020-01-19 DIAGNOSIS — R791 Abnormal coagulation profile: Secondary | ICD-10-CM | POA: Diagnosis not present

## 2020-02-09 DIAGNOSIS — R791 Abnormal coagulation profile: Secondary | ICD-10-CM | POA: Diagnosis not present

## 2020-02-17 IMAGING — CT CT HEART MORP W/ CTA COR W/ SCORE W/ CA W/CM &/OR W/O CM
2 of 6 series · 12 of 20 positions shown, 14 images · IV contrast (APPLIED)
Comparison: Chest CT 06/23/2008.

CLINICAL DATA: 89-year-old male with non-ischemic cardiomyopathy,
critical aortic stenosis being evaluated for a TAVR procedure vs
AVR.

EXAM:
Cardiac TAVR CT
TECHNIQUE: The patient was scanned on a Phillips Force scanner. A 120 kV
retrospective scan was triggered in the descending thoracic aorta at
111 HU's. Gantry rotation speed was 250 msecs and collimation was .6
mm. No beta blockade or nitro were given. The 3D data set was
reconstructed in 5% intervals of the R-R cycle. Systolic and
diastolic phases were analyzed on a dedicated work station using
MPR, MIP and VRT modes. The patient received 80 cc of contrast.

[Series 5: 0-90% · axial · 0.41mm/px · z∈[+1117,+1331]mm · 6 of 5000 slices shown, 8 images]
[im 715/5000  vessel]
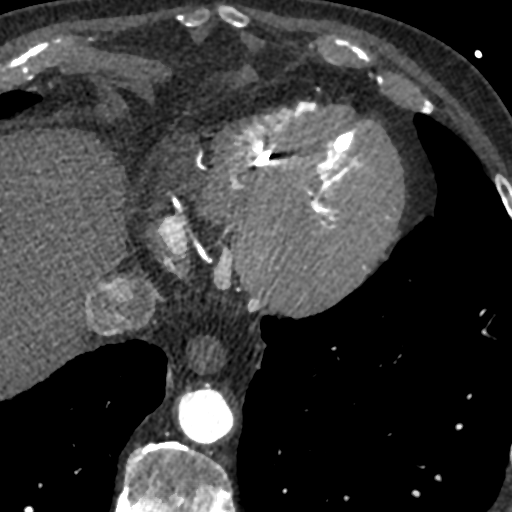
[im 715/5000  lung]
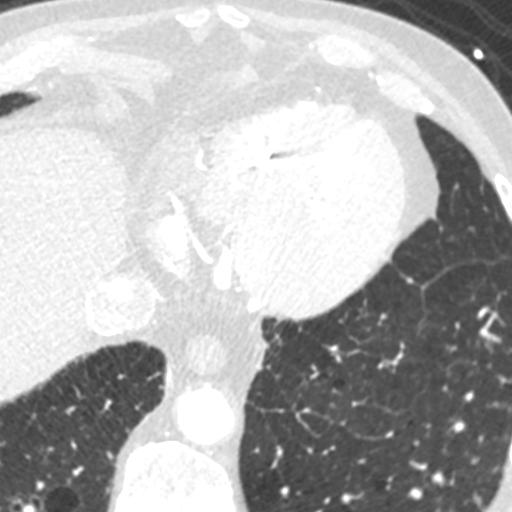
[im 1429/5000  vessel]
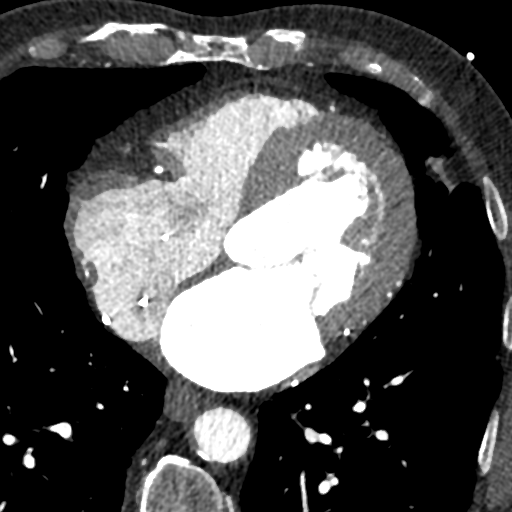
[im 2143/5000  vessel]
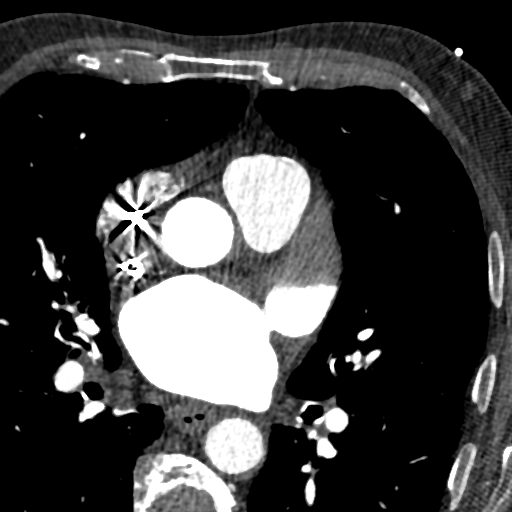
[im 2857/5000  vessel]
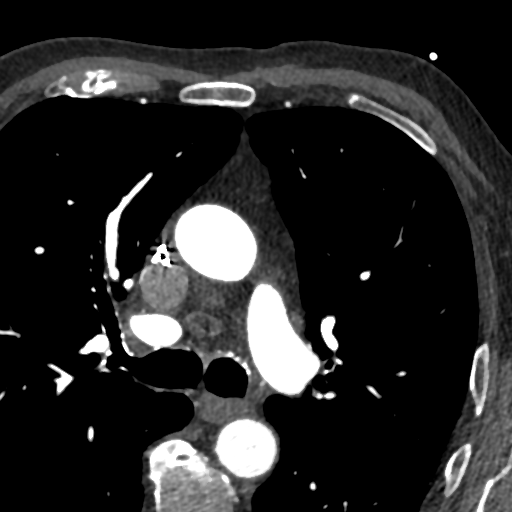
[im 3571/5000  vessel]
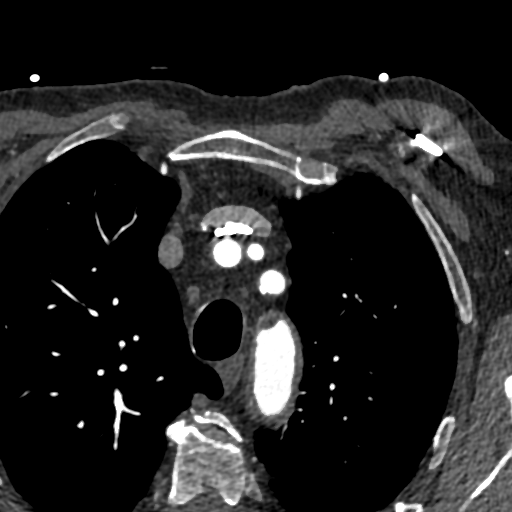
[im 3571/5000  lung]
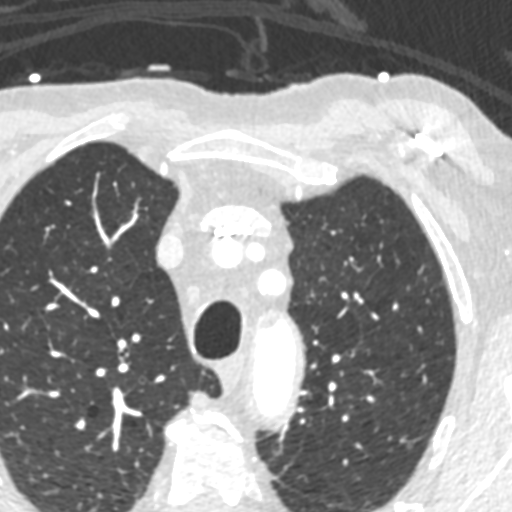
[im 4285/5000  vessel]
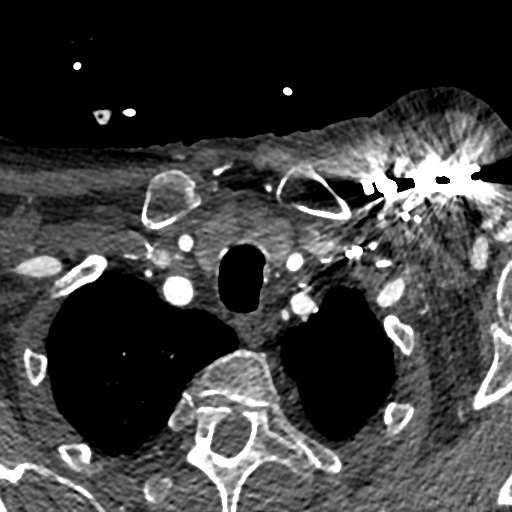

[Series 6: 5-95% · axial · 0.41mm/px · z∈[+1117,+1331]mm · 6 of 5000 slices shown]
[im 715/5000  vessel]
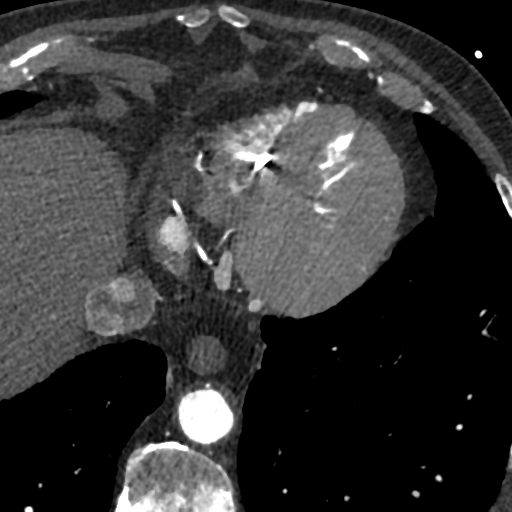
[im 1429/5000  vessel]
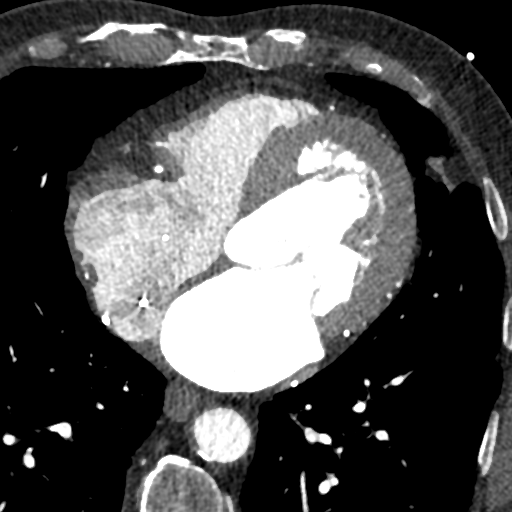
[im 2143/5000  vessel]
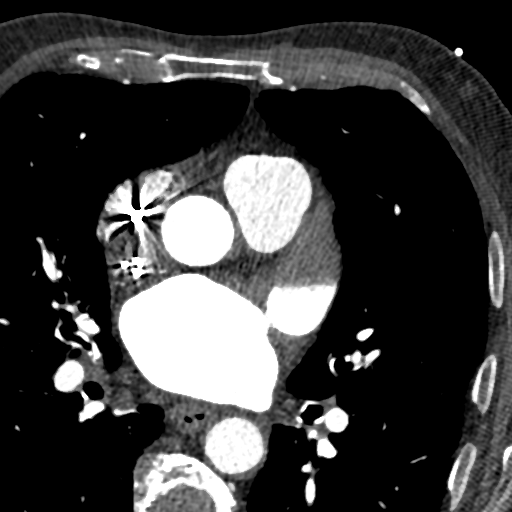
[im 2857/5000  vessel]
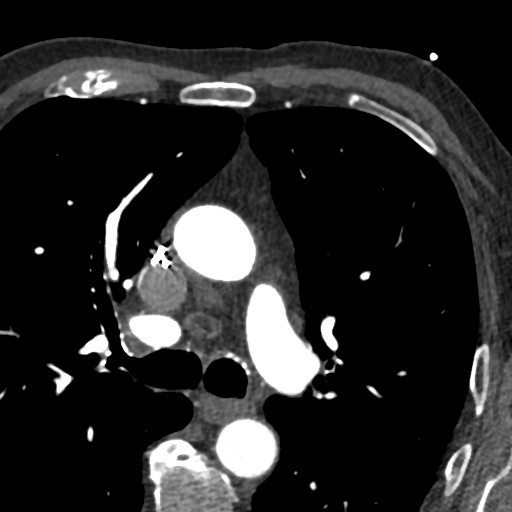
[im 3571/5000  vessel]
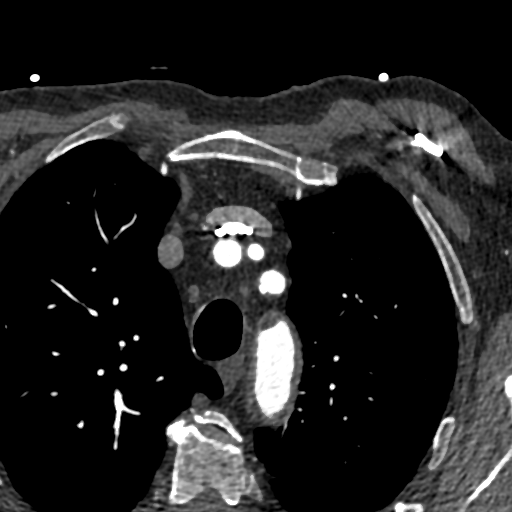
[im 4285/5000  vessel]
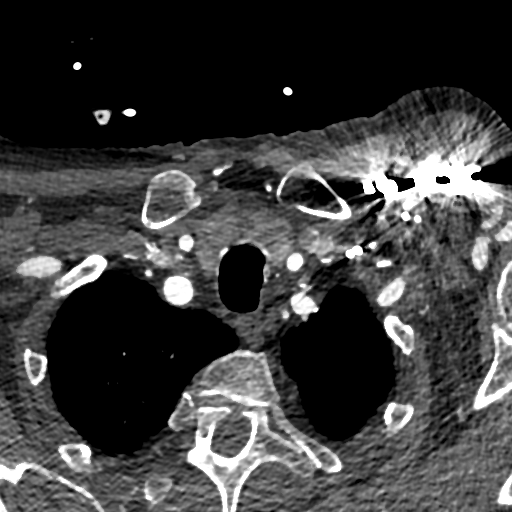

[12 of 20 positions shown; findings below may reference images not displayed]

FINDINGS: Aortic Valve: Trileaflet aortic valve with severely thickened and
calcified leaflets and severely restricted leaflet opening. There
are no calcifications extending into the LVOT.

Aorta: Normal size with mild to moderate diffuse calcifications and
atherosclerotic plaque. No dissection.

Sinotubular Junction: 28 x 26 mm

Ascending Thoracic Aorta: 30 x 28 mm

Aortic Arch: 24 x 24 mm

Descending Thoracic Aorta: 24 x 23 mm

Sinus of Valsalva Measurements:

Non-coronary: 35 mm

Right -coronary: 33 mm

Left -coronary: 36 mm

Coronary Artery Height above Annulus:

Left Main: 16 mm

Right Coronary: 22 mm

Virtual Basal Annulus Measurements:

Maximum/Minimum Diameter: 32.0 x 25.5 mm

Mean Diameter: 28.3 mm

Perimeter: 90.3 mm

Area: 628 mm2

Optimum Fluoroscopic Angle for Delivery: LAO 5 AODHA 8
IMPRESSION: 1. Trileaflet aortic valve with severely thickened and calcified
leaflets and severely restricted leaflet opening and no
calcifications extending into the LVOT. Annular measurements
suitable for delivery of a 29 mm Edwards-SAPIEN 3 valve.

2. Sufficient coronary to annulus distance.

3. Optimum Fluoroscopic Angle for Delivery:  LAO 5 AODHA 8

4. The left atrial appendage is very large with a filling defect at
the very superior portion. This doesn't completely clear on the
delayed imaging but streaks of contrast suggest that this is most
probably artifact (not a thrombus).

Zuhair Sudduth

EXAM:
OVER-READ INTERPRETATION  CT CHEST

The following report is an over-read performed by radiologist Dr.
Curtis Villagran [REDACTED] on 03/22/2018. This
over-read does not include interpretation of cardiac or coronary
anatomy or pathology. The coronary calcium score/coronary CTA
interpretation by the cardiologist is attached.
FINDINGS: Extracardiac findings will be described separately under dictation
for contemporaneously obtained CTA chest, abdomen and pelvis.
IMPRESSION: Please see separate dictation for contemporaneously obtained CTA
chest, abdomen and pelvis dated 03/21/2018 for full description of
relevant extracardiac findings.

## 2020-03-11 DIAGNOSIS — Z7901 Long term (current) use of anticoagulants: Secondary | ICD-10-CM | POA: Diagnosis not present

## 2020-03-24 ENCOUNTER — Other Ambulatory Visit: Payer: Self-pay | Admitting: Physician Assistant

## 2020-03-26 ENCOUNTER — Telehealth: Payer: Self-pay | Admitting: Cardiology

## 2020-03-26 ENCOUNTER — Ambulatory Visit (INDEPENDENT_AMBULATORY_CARE_PROVIDER_SITE_OTHER): Payer: Medicare PPO | Admitting: *Deleted

## 2020-03-26 DIAGNOSIS — I4821 Permanent atrial fibrillation: Secondary | ICD-10-CM

## 2020-03-26 NOTE — Telephone Encounter (Signed)
Spoke with pt to advise transmission was received.  Pt denies additional questions at this time.

## 2020-03-26 NOTE — Telephone Encounter (Signed)
New Message  Pt is returning a call to the [facility] call back number [phone]  Pt stated it went though 2 phases and the 3rd phase it started beeping.

## 2020-03-28 LAB — CUP PACEART REMOTE DEVICE CHECK
Battery Remaining Longevity: 36 mo
Battery Remaining Percentage: 38 %
Battery Voltage: 2.84 V
Date Time Interrogation Session: 20210917094325
Implantable Lead Implant Date: 20141226
Implantable Lead Implant Date: 20141226
Implantable Lead Location: 753858
Implantable Lead Location: 753860
Implantable Pulse Generator Implant Date: 20141226
Lead Channel Impedance Value: 430 Ohm
Lead Channel Impedance Value: 750 Ohm
Lead Channel Pacing Threshold Amplitude: 0.75 V
Lead Channel Pacing Threshold Amplitude: 1 V
Lead Channel Pacing Threshold Pulse Width: 0.4 ms
Lead Channel Pacing Threshold Pulse Width: 0.5 ms
Lead Channel Sensing Intrinsic Amplitude: 11.4 mV
Lead Channel Setting Pacing Amplitude: 2.5 V
Lead Channel Setting Pacing Amplitude: 2.5 V
Lead Channel Setting Pacing Pulse Width: 0.4 ms
Lead Channel Setting Pacing Pulse Width: 0.5 ms
Lead Channel Setting Sensing Sensitivity: 4 mV
Pulse Gen Model: 3242
Pulse Gen Serial Number: 7547478

## 2020-03-29 NOTE — Progress Notes (Signed)
Remote pacemaker transmission.   

## 2020-04-05 ENCOUNTER — Other Ambulatory Visit: Payer: Self-pay

## 2020-04-05 ENCOUNTER — Encounter: Payer: Self-pay | Admitting: Cardiology

## 2020-04-05 ENCOUNTER — Ambulatory Visit (INDEPENDENT_AMBULATORY_CARE_PROVIDER_SITE_OTHER): Payer: Medicare PPO | Admitting: Cardiology

## 2020-04-05 VITALS — BP 100/64 | HR 73 | Ht 69.0 in | Wt 158.6 lb

## 2020-04-05 DIAGNOSIS — Z952 Presence of prosthetic heart valve: Secondary | ICD-10-CM

## 2020-04-05 DIAGNOSIS — I4821 Permanent atrial fibrillation: Secondary | ICD-10-CM

## 2020-04-05 DIAGNOSIS — Z950 Presence of cardiac pacemaker: Secondary | ICD-10-CM | POA: Diagnosis not present

## 2020-04-05 DIAGNOSIS — I5042 Chronic combined systolic (congestive) and diastolic (congestive) heart failure: Secondary | ICD-10-CM | POA: Diagnosis not present

## 2020-04-05 DIAGNOSIS — I420 Dilated cardiomyopathy: Secondary | ICD-10-CM | POA: Diagnosis not present

## 2020-04-05 NOTE — Patient Instructions (Signed)
Medication Instructions:  Your physician recommends that you continue on your current medications as directed. Please refer to the Current Medication list given to you today.  *If you need a refill on your cardiac medications before your next appointment, please call your pharmacy*   Lab Work: None ordered   Testing/Procedures: None ordered   Follow-Up: At [facility], you and your health needs are our priority.  As part of our continuing mission to provide you with exceptional heart care, we have created designated Provider Care Teams.  These Care Teams include your primary Cardiologist (physician) and Advanced Practice Providers (APPs -  Physician Assistants and Nurse Practitioners) who all work together to provide you with the care you need, when you need it.  We recommend signing up for the patient portal called "MyChart".  Sign up information is provided on this After Visit Summary.  MyChart is used to connect with patients for Virtual Visits (Telemedicine).  Patients are able to view lab/test results, encounter notes, upcoming appointments, etc.  Non-urgent messages can be sent to your provider as well.   To learn more about what you can do with MyChart, go to [email or web address].    Remote monitoring is used to monitor your Pacemaker or ICD from home. This monitoring reduces the number of office visits required to check your device to one time per year. It allows [place] to keep an eye on the functioning of your device to ensure it is working properly. You are scheduled for a device check from home on 06/25/20. You may send your transmission at any time that day. If you have a wireless device, the transmission will be sent automatically. After your physician reviews your transmission, you will receive a postcard with your next transmission date.  Your next appointment:   1 year(s)  The format for your next appointment:   In Person  Provider:   Allegra Lai, MD   You have been  referred to establish with Dr. Agustin Cree.   Thank you for choosing [facility]!!   Trinidad Curet, RN [phone]

## 2020-04-05 NOTE — Progress Notes (Signed)
Electrophysiology Office Note   Date:  04/05/2020   ID:  [name] [name], DOB [birth year], MRN [id number]  PCP:  Nicoletta Dress, MD  Cardiologist:  Agustin Cree Primary Electrophysiologist:  Jaleal Schliep Meredith Leeds, MD    Chief Complaint: pacemaker   History of Present Illness: [name] [name] is a 86 y.o. male who is being seen today for the evaluation of pacemaker at the request of Nicoletta Dress, MD. Presenting today for electrophysiology evaluation.  He has a history of hypertension, sick sinus syndrome status post [facility] Jude CRT-P, nonischemic cardiomyopathy, hypertension, CKD stage III, chronic combined systolic and diastolic heart failure, atrial fibrillation, type 2 diabetes, legal blindness, and severe AS status post TAVR 04/09/2018.  He was diagnosed with atrial fibrillation in 1995.  He has been on warfarin and has undergone AV node ablation.  He had his most recent generator change in 2014.  He has had 3 pacemakers total.  Echo August 2019 showed severe aortic stenosis and he is now status post TAVR.  Today, he denies symptoms of palpitations, chest pain, shortness of breath, orthopnea, PND, lower extremity edema, claudication, dizziness, presyncope, syncope, bleeding, or neurologic sequela. The patient is tolerating medications without difficulties.    Past Medical History:  Diagnosis Date  . Atrial fibrillation (HCC)    echo- EF 35-40%; LA severely dilated; RA mod dilated, RV systolic pressure 22GURK; LV systolic fcn mod reduced  . Cancer ([place])    skin cancer  . CHF (congestive heart failure) ([place])   . Diabetes mellitus without complication ([place])   . Hypertension   . Presence of permanent cardiac pacemaker   . 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 Severe aortic stenosis   . SSS (sick sinus syndrome) ([place])    pacemaker placed in 1993  .  Tremors of nervous system    Past Surgical History:  Procedure Laterality Date  . AV NODE ABLATION Bilateral 07/04/2013   Procedure: AV NODE ABLATION;  Surgeon: Evans Lance, MD;  Location: [facility] CATH LAB;  Service: Cardiovascular;  Laterality: Bilateral;  . BI-VENTRICULAR PACEMAKER INSERTION Bilateral 07/04/2013   Procedure: BI-VENTRICULAR PACEMAKER INSERTION (CRT-P);  Surgeon: Evans Lance, MD;  Location: [facility] CATH LAB;  Service: Cardiovascular;  Laterality: Bilateral;  . BIV PACEMAKER GENERATOR CHANGE OUT  11/2004  . COLONOSCOPY    . HERNIA REPAIR     left groin  . INSERT / REPLACE / REMOVE PACEMAKER    . INTRAOPERATIVE TRANSTHORACIC ECHOCARDIOGRAM N/A 04/09/2018   Procedure: INTRAOPERATIVE TRANSTHORACIC ECHOCARDIOGRAM;  Surgeon: Burnell Blanks, MD;  Location: [place];  Service: Open Heart Surgery;  Laterality: N/A;  . LEFT HEART CATHETERIZATION WITH CORONARY ANGIOGRAM N/A 07/07/2013   Procedure: LEFT HEART CATHETERIZATION WITH CORONARY ANGIOGRAM;  Surgeon: Pixie Casino, MD;  Location: [facility] CATH LAB;  Service: Cardiovascular;  Laterality: N/A;  . PACEMAKER INSERTION  1993  . RIGHT/LEFT HEART CATH AND CORONARY ANGIOGRAPHY N/A 03/06/2018   Procedure: RIGHT/LEFT HEART CATH AND CORONARY ANGIOGRAPHY;  Surgeon: Burnell Blanks, MD;  Location: [place] CV LAB;  Service: Cardiovascular;  Laterality: N/A;  . TONSILLECTOMY     adenoidectomy  . TRANSCATHETER AORTIC VALVE REPLACEMENT, TRANSFEMORAL  04/09/2018  . TRANSCATHETER AORTIC VALVE REPLACEMENT, TRANSFEMORAL Right 04/09/2018   Procedure: TRANSCATHETER AORTIC VALVE REPLACEMENT, TRANSFEMORAL. 71mm Edwards Sapien 3 THV.;  Surgeon: Burnell Blanks, MD;  Location: [place];  Service: Open  Heart Surgery;  Laterality: Right;     Current Outpatient Medications  Medication Sig Dispense Refill  . acetaminophen (TYLENOL) 325 MG tablet Take 1-2 tablets (325-650 mg total) by mouth every 4 (four) hours as needed for mild pain.    .  carbidopa-levodopa (SINEMET IR) 25-100 MG per tablet Take 1 tablet by mouth 3 (three) times daily.    . diphenhydrAMINE (BENADRYL) 25 mg capsule Take 25 mg by mouth at bedtime as needed for allergies or sleep.    . furosemide (LASIX) 40 MG tablet Take 40 mg by mouth daily.    Marland Kitchen linagliptin (TRADJENTA) 5 MG TABS tablet Take 5 mg by mouth daily.    Marland Kitchen lisinopril (ZESTRIL) 5 MG tablet Take 1 tablet by mouth once daily 30 tablet 0  . metoprolol succinate (TOPROL-XL) 25 MG 24 hr tablet Take 25 mg by mouth daily. Take with or immediately following a meal.     . Multiple Vitamins-Minerals (PRESERVISION AREDS 2) CAPS Take 1 capsule by mouth 2 (two) times daily.     . Omega-3 Fatty Acids (FISH OIL) 1200 MG CAPS Take 1,200 mg by mouth 2 (two) times daily.    . vitamin E 400 UNIT capsule Take 400 Units by mouth daily.    Marland Kitchen warfarin (COUMADIN) 5 MG tablet Take 5 mg every day except take 2.5 mg on Monday and Friday     No current facility-administered medications for this visit.    Allergies:   Amiodarone   Social History:  The patient  reports that he quit smoking about 50 years ago. He has never used smokeless tobacco. He reports that he does not drink alcohol and does not use drugs.   Family History:  The patient's family history includes Asthma in his father; Coronary artery disease in his mother; Stroke in his mother.    ROS:  Please see the history of present illness.   Otherwise, review of systems is positive for none.   All other systems are reviewed and negative.    PHYSICAL EXAM: VS:  BP 100/64   Pulse 73   Ht 5\' 9"  (1.753 m)   Wt 158 lb 9.6 oz (71.9 kg)   SpO2 97%   BMI 23.42 kg/m  , BMI Body mass index is 23.42 kg/m. GEN: Well nourished, well developed, in no acute distress  HEENT: normal  Neck: no JVD, carotid bruits, or masses Cardiac: RRR; no murmurs, rubs, or gallops,no edema  Respiratory:  clear to auscultation bilaterally, normal work of breathing GI: soft, nontender,  nondistended, + BS MS: no deformity or atrophy  Skin: warm and dry, device pocket is well healed Neuro:  Strength and sensation are intact Psych: euthymic mood, full affect  EKG:  EKG is ordered today. Personal review of the ekg ordered shows atrial fibrillation, ventricular paced  Device interrogation is reviewed today in detail.  See PaceArt for details.   Recent Labs: No results found for requested labs within last 8760 hours.    Lipid Panel  No results found for: CHOL, TRIG, HDL, CHOLHDL, VLDL, LDLCALC, LDLDIRECT   Wt Readings from Last 3 Encounters:  04/05/20 158 lb 9.6 oz (71.9 kg)  03/27/19 155 lb (70.3 kg)  05/23/18 154 lb (69.9 kg)      Other studies Reviewed: Additional studies/ records that were reviewed today include: TTE 03/26/19  Review of the above records today demonstrates:  1. The left ventricle has moderately reduced systolic function, with an  ejection fraction of 35-40%. The cavity  size was normal. There is mild  concentric left ventricular hypertrophy. Left ventricular diastolic  Doppler parameters are consistent with  pseudonormalization. Elevated mean left atrial pressure Left ventricular  diffuse hypokinesis (Grade II Diastolic Dysfunction).  2. The is abnormal LV global longitudinal strain ( -8.1%).  3. The bioprosthetic aortic valve is well seated. Status post 29 mm  Edward Sapien 3. There is no evidence or regurgitation or paravalvular  leaks. There is no stenosis: velocity 1.6 m/s, Mean gradient 6 mmHG, Peak  gradient 10 mmHG and DVI 0.52.  4. The MR jet is centrally-directed. No evidence of mitral valve  stenosis.  5. The aorta is normal unless otherwise noted.  6. Tricuspid valve regurgitation is mild-moderate.  7. RVSP is mildy elevated.  8. Left atrial size was severely dilated.    ASSESSMENT AND PLAN:  1.  Permanent atrial fibrillation: Currently on warfarin.  Status post CRT-P.  CHA2DS2-VASc of 4.  Is status post [employer]  CRT-P.  Device functioning appropriately.  No changes.  2.  Hypertension: Currently well controlled  3.  Chronic combined systolic and diastolic heart failure: Continue lisinopril and Toprol-XL.  4.  Severe aortic stenosis: Status post TAVR.  Valve functioning appropriately on most recent echo.  We Sanaia Jasso arrange for follow-up with general cardiology    Current medicines are reviewed at length with the patient today.   The patient does not have concerns regarding his medicines.  The following changes were made today:  none  Labs/ tests ordered today include:  Orders Placed This Encounter  Procedures  . EKG 12-Lead     Disposition:   FU with Sareen Randon 1 year  Signed, Kalliopi Coupland Meredith Leeds, MD  04/05/2020 11:49 AM     [facility] [address] [place] [address] [phone] (office) [phone] (fax)

## 2020-04-12 DIAGNOSIS — Z7901 Long term (current) use of anticoagulants: Secondary | ICD-10-CM | POA: Diagnosis not present

## 2020-05-14 ENCOUNTER — Encounter: Payer: Self-pay | Admitting: Cardiology

## 2020-05-14 ENCOUNTER — Ambulatory Visit: Payer: Medicare PPO | Admitting: Cardiology

## 2020-05-14 ENCOUNTER — Other Ambulatory Visit: Payer: Self-pay

## 2020-05-14 VITALS — BP 142/92 | HR 80 | Ht 69.0 in | Wt 159.0 lb

## 2020-05-14 DIAGNOSIS — Z952 Presence of prosthetic heart valve: Secondary | ICD-10-CM | POA: Diagnosis not present

## 2020-05-14 DIAGNOSIS — I4821 Permanent atrial fibrillation: Secondary | ICD-10-CM

## 2020-05-14 DIAGNOSIS — G20 Parkinson's disease: Secondary | ICD-10-CM

## 2020-05-14 DIAGNOSIS — Z950 Presence of cardiac pacemaker: Secondary | ICD-10-CM

## 2020-05-14 DIAGNOSIS — I420 Dilated cardiomyopathy: Secondary | ICD-10-CM | POA: Diagnosis not present

## 2020-05-14 DIAGNOSIS — Z7901 Long term (current) use of anticoagulants: Secondary | ICD-10-CM | POA: Diagnosis not present

## 2020-05-14 NOTE — Addendum Note (Signed)
Addended by: Senaida Ores on: 05/14/2020 10:54 AM   Modules accepted: Orders

## 2020-05-14 NOTE — Progress Notes (Signed)
Cardiology Office Note:    Date:  05/14/2020   ID:  [name] [name], DOB [birth year], MRN [id number]  PCP:  Nicoletta Dress, MD  Cardiologist:  Jenne Campus, MD    Referring MD: Nicoletta Dress, MD   No chief complaint on file. I am doing well  History of Present Illness:    [name] [name] is a 86 y.o. male with past medical history significant for sick sinus syndrome, permanent atrial fibrillation, status post [facility] Jude CRT-P, nonischemic cardiomyopathy with ejection fraction 35 to 40%, essential hypertension, chronic kidney failure, type 2 diabetes, legal blindness, history of severe arctic stenosis status post TAVI in April 09, 2018.  He would like to come to our office in [place] rather than drive to [employer] and he would like to have a card transferred to our office.  Overall he is doing amazingly well he lives alone however he did does have a friend woman who lives with him typically for half of the month.  He is pedaling around is able to take of activities daily living have no difficulty doing it.  Denies have any swelling of lower extremities, no chest pain no tightness no squeezing no pressure no burning in the chest overall he is doing well.  Past Medical History:  Diagnosis Date  . Atrial fibrillation (HCC)    echo- EF 35-40%; LA severely dilated; RA mod dilated, RV systolic pressure [id number]; LV systolic fcn mod reduced  . Cancer ([place])    skin cancer  . CHF (congestive heart failure) ([place])   . Diabetes mellitus without complication ([place])   . Hypertension   . Presence of permanent cardiac pacemaker   . 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 Severe aortic stenosis   . SSS (sick sinus syndrome) ([place])    pacemaker placed in 1993  . Tremors of nervous system     Past Surgical History:  Procedure Laterality Date  . AV NODE ABLATION  Bilateral 07/04/2013   Procedure: AV NODE ABLATION;  Surgeon: Evans Lance, MD;  Location: [facility] CATH LAB;  Service: Cardiovascular;  Laterality: Bilateral;  . BI-VENTRICULAR PACEMAKER INSERTION Bilateral 07/04/2013   Procedure: BI-VENTRICULAR PACEMAKER INSERTION (CRT-P);  Surgeon: Evans Lance, MD;  Location: [facility] CATH LAB;  Service: Cardiovascular;  Laterality: Bilateral;  . BIV PACEMAKER GENERATOR CHANGE OUT  11/2004  . COLONOSCOPY    . HERNIA REPAIR     left groin  . INSERT / REPLACE / REMOVE PACEMAKER    . INTRAOPERATIVE TRANSTHORACIC ECHOCARDIOGRAM N/A 04/09/2018   Procedure: INTRAOPERATIVE TRANSTHORACIC ECHOCARDIOGRAM;  Surgeon: Burnell Blanks, MD;  Location: [place];  Service: Open Heart Surgery;  Laterality: N/A;  . LEFT HEART CATHETERIZATION WITH CORONARY ANGIOGRAM N/A 07/07/2013   Procedure: LEFT HEART CATHETERIZATION WITH CORONARY ANGIOGRAM;  Surgeon: Pixie Casino, MD;  Location: [facility] CATH LAB;  Service: Cardiovascular;  Laterality: N/A;  . PACEMAKER INSERTION  1993  . RIGHT/LEFT HEART CATH AND CORONARY ANGIOGRAPHY N/A 03/06/2018   Procedure: RIGHT/LEFT HEART CATH AND CORONARY ANGIOGRAPHY;  Surgeon: Burnell Blanks, MD;  Location: [place] CV LAB;  Service: Cardiovascular;  Laterality: N/A;  . TONSILLECTOMY     adenoidectomy  . TRANSCATHETER AORTIC VALVE REPLACEMENT, TRANSFEMORAL  04/09/2018  . TRANSCATHETER AORTIC VALVE REPLACEMENT, TRANSFEMORAL Right 04/09/2018   Procedure: TRANSCATHETER AORTIC VALVE REPLACEMENT, TRANSFEMORAL. 64mm Edwards Sapien 3 THV.;  Surgeon: Burnell Blanks,  MD;  Location: [facility] OR;  Service: Open Heart Surgery;  Laterality: Right;    Current Medications: Current Meds  Medication Sig  . acetaminophen (TYLENOL) 325 MG tablet Take 1-2 tablets (325-650 mg total) by mouth every 4 (four) hours as needed for mild pain.  . carbidopa-levodopa (SINEMET IR) 25-100 MG per tablet Take 1 tablet by mouth 3 (three) times daily.  . diphenhydrAMINE  (BENADRYL) 25 mg capsule Take 25 mg by mouth at bedtime as needed for allergies or sleep.  . furosemide (LASIX) 40 MG tablet Take 40 mg by mouth daily.  Marland Kitchen linagliptin (TRADJENTA) 5 MG TABS tablet Take 5 mg by mouth daily.  Marland Kitchen lisinopril (ZESTRIL) 5 MG tablet Take 1 tablet by mouth once daily  . metoprolol succinate (TOPROL-XL) 25 MG 24 hr tablet Take 25 mg by mouth daily. Take with or immediately following a meal.   . Multiple Vitamins-Minerals (PRESERVISION AREDS 2) CAPS Take 1 capsule by mouth 2 (two) times daily.   . Omega-3 Fatty Acids (FISH OIL) 1200 MG CAPS Take 1,200 mg by mouth 2 (two) times daily.  . vitamin E 400 UNIT capsule Take 400 Units by mouth daily.  Marland Kitchen warfarin (COUMADIN) 5 MG tablet Take 5 mg every day except take 2.5 mg on Monday and Friday     Allergies:   Amiodarone   Social History   Socioeconomic History  . Marital status: Widowed    Spouse name: Not on file  . Number of children: 2  . Years of education: 38  . Highest education level: Not on file  Occupational History  . Occupation: retired  Tobacco Use  . Smoking status: Former Smoker    Quit date: 07/09/1969    Years since quitting: 50.8  . Smokeless tobacco: Never Used  Vaping Use  . Vaping Use: Never used  Substance and Sexual Activity  . Alcohol use: No  . Drug use: No  . Sexual activity: Not on file  Other Topics Concern  . Not on file  Social History Narrative   Widower.  Retired Hydrographic surveyor.   Social Determinants of Health   Financial Resource Strain:   . Difficulty of Paying Living Expenses: Not on file  Food Insecurity:   . Worried About Charity fundraiser in the Last Year: Not on file  . Ran Out of Food in the Last Year: Not on file  Transportation Needs:   . Lack of Transportation (Medical): Not on file  . Lack of Transportation (Non-Medical): Not on file  Physical Activity:   . Days of Exercise per Week: Not on file  . Minutes of Exercise per Session: Not on file  Stress:   .  Feeling of Stress : Not on file  Social Connections:   . Frequency of Communication with Friends and Family: Not on file  . Frequency of Social Gatherings with Friends and Family: Not on file  . Attends Religious Services: Not on file  . Active Member of Clubs or Organizations: Not on file  . Attends Archivist Meetings: Not on file  . Marital Status: Not on file     Family History: The patient's family history includes Asthma in his father; Coronary artery disease in his mother; Stroke in his mother. ROS:   Please see the history of present illness.    All 14 point review of systems negative except as described per history of present illness  EKGs/Labs/Other Studies Reviewed:      Recent Labs: No results  found for requested labs within last 8760 hours.  Recent Lipid Panel No results found for: CHOL, TRIG, HDL, CHOLHDL, VLDL, LDLCALC, LDLDIRECT  Physical Exam:    VS:  BP (!) 142/92 (BP Location: Left Arm, Patient Position: Sitting, Cuff Size: Normal)   Pulse 80   Ht 5\' 9"  (1.753 m)   Wt 159 lb (72.1 kg)   SpO2 96%   BMI 23.48 kg/m     Wt Readings from Last 3 Encounters:  05/14/20 159 lb (72.1 kg)  04/05/20 158 lb 9.6 oz (71.9 kg)  03/27/19 155 lb (70.3 kg)     GEN:  Well nourished, well developed in no acute distress HEENT: Normal NECK: No JVD; No carotid bruits LYMPHATICS: No lymphadenopathy CARDIAC: RRR, no murmurs, no rubs, no gallops RESPIRATORY:  Clear to auscultation without rales, wheezing or rhonchi  ABDOMEN: Soft, non-tender, non-distended MUSCULOSKELETAL:  No edema; No deformity  SKIN: Warm and dry LOWER EXTREMITIES: no swelling NEUROLOGIC:  Alert and oriented x 3 PSYCHIATRIC:  Normal affect   ASSESSMENT:    1. S/P TAVR (transcatheter aortic valve replacement)   2. Biventricular cardiac pacemaker upgrade 07/05/13 (St Jude)   3. Current use of long term anticoagulation   4. Parkinson's disease ([place])   5. Dilated cardiomyopathy ([place])   6.  Permanent atrial fibrillation- AVN RFA 07/05/13    PLAN:    In order of problems listed above:  1. Status post TAVi, time to repeat his echocardiogram which we will make arrangements for it.  Overall hemodynamically stable and does have any symptoms and signs of renarrowing of the valve. 2. BiV pacer present its Abbott device.  Function properly I did review interrogation.  Normal function. 3. Permanent atrial fibrillation status post AV nodal ablation done in December 2014.  Doing well from that point review.  He is anticoagulated Coumadin as per his wishes.  It is followed by internal medicine team. 4. History of dilated cardiomyopathy: Ejection fraction 35 to 40% he is on lisinopril as well as Toprol-XL.  Small dosages he is also taking some Lasix.  In the future we may consider upgrading this therapy.  Today's a first visit with me his blood pressure slightly elevated.  For now we will continue present management. 5. Parkinson's disease: Followed by internal medicine team and neurology.    examMedication Adjustments/Labs and Tests Ordered: Current medicines are reviewed at length with the patient today.  Concerns regarding medicines are outlined above.  No orders of the defined types were placed in this encounter.  Medication changes: No orders of the defined types were placed in this encounter.   Signed, Park Liter, MD, [facility] 05/14/2020 10:48 AM    [place]

## 2020-05-14 NOTE — Patient Instructions (Signed)
Medication Instructions:  Your physician recommends that you continue on your current medications as directed. Please refer to the Current Medication list given to you today.  *If you need a refill on your cardiac medications before your next appointment, please call your pharmacy*   Lab Work: None. If you have labs (blood work) drawn today and your tests are completely normal, you will receive your results only by: . MyChart Message (if you have MyChart) OR . A paper copy in the mail If you have any lab test that is abnormal or we need to change your treatment, we will call you to review the results.   Testing/Procedures: Your physician has requested that you have an echocardiogram. Echocardiography is a painless test that uses sound waves to create images of your heart. It provides your doctor with information about the size and shape of your heart and how well your heart's chambers and valves are working. This procedure takes approximately one hour. There are no restrictions for this procedure.     Follow-Up: At [facility], you and your health needs are our priority.  As part of our continuing mission to provide you with exceptional heart care, we have created designated Provider Care Teams.  These Care Teams include your primary Cardiologist (physician) and Advanced Practice Providers (APPs -  Physician Assistants and Nurse Practitioners) who all work together to provide you with the care you need, when you need it.  We recommend signing up for the patient portal called "MyChart".  Sign up information is provided on this After Visit Summary.  MyChart is used to connect with patients for Virtual Visits (Telemedicine).  Patients are able to view lab/test results, encounter notes, upcoming appointments, etc.  Non-urgent messages can be sent to your provider as well.   To learn more about what you can do with MyChart, go to [email or web address].    Your next appointment:   3  month(s)  The format for your next appointment:   In Person  Provider:   Robert Krasowski, MD   Other Instructions   Echocardiogram An echocardiogram is a procedure that uses painless sound waves (ultrasound) to produce an image of the heart. Images from an echocardiogram can provide important information about:  Signs of coronary artery disease (CAD).  Aneurysm detection. An aneurysm is a weak or damaged part of an artery wall that bulges out from the normal force of blood pumping through the body.  Heart size and shape. Changes in the size or shape of the heart can be associated with certain conditions, including heart failure, aneurysm, and CAD.  Heart muscle function.  Heart valve function.  Signs of a past heart attack.  Fluid buildup around the heart.  Thickening of the heart muscle.  A tumor or infectious growth around the heart valves. Tell a health care provider about:  Any allergies you have.  All medicines you are taking, including vitamins, herbs, eye drops, creams, and over-the-counter medicines.  Any blood disorders you have.  Any surgeries you have had.  Any medical conditions you have.  Whether you are pregnant or may be pregnant. What are the risks? Generally, this is a safe procedure. However, problems may occur, including:  Allergic reaction to dye (contrast) that may be used during the procedure. What happens before the procedure? No specific preparation is needed. You may eat and drink normally. What happens during the procedure?   An IV tube may be inserted into one of your veins.  You may receive   contrast through this tube. A contrast is an injection that improves the quality of the pictures from your heart.  A gel will be applied to your chest.  A wand-like tool (transducer) will be moved over your chest. The gel will help to transmit the sound waves from the transducer.  The sound waves will harmlessly bounce off of your heart to  allow the heart images to be captured in real-time motion. The images will be recorded on a computer. The procedure may vary among health care providers and hospitals. What happens after the procedure?  You may return to your normal, everyday life, including diet, activities, and medicines, unless your health care provider tells you not to do that. Summary  An echocardiogram is a procedure that uses painless sound waves (ultrasound) to produce an image of the heart.  Images from an echocardiogram can provide important information about the size and shape of your heart, heart muscle function, heart valve function, and fluid buildup around your heart.  You do not need to do anything to prepare before this procedure. You may eat and drink normally.  After the echocardiogram is completed, you may return to your normal, everyday life, unless your health care provider tells you not to do that. This information is not intended to replace advice given to you by your health care provider. Make sure you discuss any questions you have with your health care provider. Document Revised: 10/17/2018 Document Reviewed: 07/29/2016 Elsevier Patient Education  2020 Elsevier Inc.   

## 2020-05-17 DIAGNOSIS — Z7901 Long term (current) use of anticoagulants: Secondary | ICD-10-CM | POA: Diagnosis not present

## 2020-05-17 DIAGNOSIS — E785 Hyperlipidemia, unspecified: Secondary | ICD-10-CM | POA: Diagnosis not present

## 2020-05-17 DIAGNOSIS — Z79899 Other long term (current) drug therapy: Secondary | ICD-10-CM | POA: Diagnosis not present

## 2020-05-17 DIAGNOSIS — E1165 Type 2 diabetes mellitus with hyperglycemia: Secondary | ICD-10-CM | POA: Diagnosis not present

## 2020-05-17 DIAGNOSIS — R809 Proteinuria, unspecified: Secondary | ICD-10-CM | POA: Diagnosis not present

## 2020-05-17 DIAGNOSIS — E1129 Type 2 diabetes mellitus with other diabetic kidney complication: Secondary | ICD-10-CM | POA: Diagnosis not present

## 2020-05-17 DIAGNOSIS — N183 Chronic kidney disease, stage 3 unspecified: Secondary | ICD-10-CM | POA: Diagnosis not present

## 2020-05-17 DIAGNOSIS — I5022 Chronic systolic (congestive) heart failure: Secondary | ICD-10-CM | POA: Diagnosis not present

## 2020-05-17 DIAGNOSIS — I482 Chronic atrial fibrillation, unspecified: Secondary | ICD-10-CM | POA: Diagnosis not present

## 2020-06-15 ENCOUNTER — Ambulatory Visit (INDEPENDENT_AMBULATORY_CARE_PROVIDER_SITE_OTHER): Payer: Medicare PPO

## 2020-06-15 ENCOUNTER — Other Ambulatory Visit: Payer: Self-pay

## 2020-06-15 DIAGNOSIS — Z950 Presence of cardiac pacemaker: Secondary | ICD-10-CM

## 2020-06-15 DIAGNOSIS — Z7901 Long term (current) use of anticoagulants: Secondary | ICD-10-CM | POA: Diagnosis not present

## 2020-06-15 DIAGNOSIS — I4821 Permanent atrial fibrillation: Secondary | ICD-10-CM | POA: Diagnosis not present

## 2020-06-15 DIAGNOSIS — Z952 Presence of prosthetic heart valve: Secondary | ICD-10-CM | POA: Diagnosis not present

## 2020-06-15 DIAGNOSIS — I420 Dilated cardiomyopathy: Secondary | ICD-10-CM

## 2020-06-15 DIAGNOSIS — G20 Parkinson's disease: Secondary | ICD-10-CM | POA: Diagnosis not present

## 2020-06-15 LAB — ECHOCARDIOGRAM COMPLETE
AR max vel: 1.41 cm2
AV Area VTI: 1.24 cm2
AV Area mean vel: 1.26 cm2
AV Mean grad: 5 mmHg
AV Peak grad: 9 mmHg
Ao pk vel: 1.5 m/s
Area-P 1/2: 4.39 cm2
Calc EF: 33.4 %
S' Lateral: 3.3 cm
Single Plane A2C EF: 35.1 %
Single Plane A4C EF: 33.4 %

## 2020-06-15 NOTE — Progress Notes (Signed)
Complete echocardiogram performed.  Jimmy Natia Fahmy RDCS, RVT  

## 2020-06-16 DIAGNOSIS — Z7901 Long term (current) use of anticoagulants: Secondary | ICD-10-CM | POA: Diagnosis not present

## 2020-06-23 DIAGNOSIS — R791 Abnormal coagulation profile: Secondary | ICD-10-CM | POA: Diagnosis not present

## 2020-06-25 ENCOUNTER — Ambulatory Visit (INDEPENDENT_AMBULATORY_CARE_PROVIDER_SITE_OTHER): Payer: Medicare PPO

## 2020-06-25 DIAGNOSIS — I4821 Permanent atrial fibrillation: Secondary | ICD-10-CM | POA: Diagnosis not present

## 2020-06-25 LAB — CUP PACEART REMOTE DEVICE CHECK
Battery Remaining Longevity: 32 mo
Battery Remaining Percentage: 33 %
Battery Voltage: 2.83 V
Date Time Interrogation Session: 20211217091614
Implantable Lead Implant Date: 20141226
Implantable Lead Implant Date: 20141226
Implantable Lead Location: 753858
Implantable Lead Location: 753860
Implantable Pulse Generator Implant Date: 20141226
Lead Channel Impedance Value: 430 Ohm
Lead Channel Impedance Value: 700 Ohm
Lead Channel Pacing Threshold Amplitude: 0.75 V
Lead Channel Pacing Threshold Amplitude: 1.5 V
Lead Channel Pacing Threshold Pulse Width: 0.4 ms
Lead Channel Pacing Threshold Pulse Width: 0.5 ms
Lead Channel Sensing Intrinsic Amplitude: 11.5 mV
Lead Channel Setting Pacing Amplitude: 2.5 V
Lead Channel Setting Pacing Amplitude: 2.5 V
Lead Channel Setting Pacing Pulse Width: 0.4 ms
Lead Channel Setting Pacing Pulse Width: 0.5 ms
Lead Channel Setting Sensing Sensitivity: 4 mV
Pulse Gen Model: 3242
Pulse Gen Serial Number: 7547478

## 2020-07-08 DIAGNOSIS — H353133 Nonexudative age-related macular degeneration, bilateral, advanced atrophic without subfoveal involvement: Secondary | ICD-10-CM | POA: Diagnosis not present

## 2020-07-08 DIAGNOSIS — E119 Type 2 diabetes mellitus without complications: Secondary | ICD-10-CM | POA: Diagnosis not present

## 2020-07-08 DIAGNOSIS — Z961 Presence of intraocular lens: Secondary | ICD-10-CM | POA: Diagnosis not present

## 2020-07-08 NOTE — Progress Notes (Signed)
Remote pacemaker transmission.   

## 2020-07-27 DIAGNOSIS — Z7901 Long term (current) use of anticoagulants: Secondary | ICD-10-CM | POA: Diagnosis not present

## 2020-08-18 DIAGNOSIS — I4891 Unspecified atrial fibrillation: Secondary | ICD-10-CM | POA: Insufficient documentation

## 2020-08-18 DIAGNOSIS — I495 Sick sinus syndrome: Secondary | ICD-10-CM | POA: Insufficient documentation

## 2020-08-18 DIAGNOSIS — C801 Malignant (primary) neoplasm, unspecified: Secondary | ICD-10-CM | POA: Insufficient documentation

## 2020-08-18 DIAGNOSIS — I350 Nonrheumatic aortic (valve) stenosis: Secondary | ICD-10-CM | POA: Insufficient documentation

## 2020-08-18 DIAGNOSIS — R251 Tremor, unspecified: Secondary | ICD-10-CM | POA: Insufficient documentation

## 2020-08-18 DIAGNOSIS — Z950 Presence of cardiac pacemaker: Secondary | ICD-10-CM | POA: Insufficient documentation

## 2020-08-18 DIAGNOSIS — I509 Heart failure, unspecified: Secondary | ICD-10-CM | POA: Insufficient documentation

## 2020-08-18 DIAGNOSIS — I10 Essential (primary) hypertension: Secondary | ICD-10-CM | POA: Insufficient documentation

## 2020-08-27 DIAGNOSIS — R791 Abnormal coagulation profile: Secondary | ICD-10-CM | POA: Diagnosis not present

## 2020-08-30 ENCOUNTER — Encounter: Payer: Self-pay | Admitting: Cardiology

## 2020-08-30 ENCOUNTER — Other Ambulatory Visit: Payer: Self-pay

## 2020-08-30 ENCOUNTER — Ambulatory Visit: Payer: Medicare PPO | Admitting: Cardiology

## 2020-08-30 VITALS — BP 126/68 | HR 79 | Ht 70.0 in | Wt 154.0 lb

## 2020-08-30 DIAGNOSIS — I4821 Permanent atrial fibrillation: Secondary | ICD-10-CM

## 2020-08-30 DIAGNOSIS — Z7901 Long term (current) use of anticoagulants: Secondary | ICD-10-CM

## 2020-08-30 DIAGNOSIS — I495 Sick sinus syndrome: Secondary | ICD-10-CM | POA: Diagnosis not present

## 2020-08-30 DIAGNOSIS — Z952 Presence of prosthetic heart valve: Secondary | ICD-10-CM | POA: Diagnosis not present

## 2020-08-30 NOTE — Progress Notes (Signed)
Cardiology Office Note:    Date:  08/30/2020   ID:  [name] [name], DOB [birth year], MRN [id number]  PCP:  Nicoletta Dress, MD  Cardiologist:  Jenne Campus, MD    Referring MD: Nicoletta Dress, MD   Chief Complaint  Patient presents with  . Follow-up  I am doing fine  History of Present Illness:    [name] [name] is a 87 y.o. male with past medical history significant for sick sinus syndrome, permanent atrial fibrillation, status post [facility] Jude CRT-P, nonischemic cardiomyopathy with ejection fraction of 50-55 based on last echocardiogram from June 15, 2020, chronic kidney failure, essential hypertension, status post TAVI done in October 2019 with 29 mm Edwards Safian 3 valve.  Also had AV nodal ablation done in 2014. Comes today 2 months of follow-up overall seems to be doing well if he is 67 already but he is doing quite well.  Denies have any chest pain tightness squeezing pressure burning chest.  Is able to mount a grass is able to clean leaves in his property and enjoying it.  He used to have a guardian however now he does not have it.  Past Medical History:  Diagnosis Date  . Acute on chronic combined systolic and diastolic CHF (congestive heart failure) ([place]) 06/29/2013  . Atrial fibrillation (HCC)    echo- EF 35-40%; LA severely dilated; RA mod dilated, RV systolic pressure 63KZSW; LV systolic fcn mod reduced  . Biventricular cardiac pacemaker upgrade 07/05/13 (St Jude) 06/29/2013  . Cancer ([place])    skin cancer  . Cardiomyopathy ([place]) 07/07/2013   Non Obstructive CAD by Cath   . CHF (congestive heart failure) ([place])   . Critical aortic valve stenosis 06/29/2013  . Current use of long term anticoagulation 06/29/2013  . Diabetes mellitus without complication ([place])   . Hypertension   . Parkinson's disease ([place]) 07/07/2013  . Permanent atrial fibrillation- AVN RFA 07/05/13 06/29/2013  . Presence of permanent cardiac pacemaker   . 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 Severe aortic stenosis   . SSS (sick sinus syndrome) ([place])    pacemaker placed in 1993  . Tremors of nervous system     Past Surgical History:  Procedure Laterality Date  . AV NODE ABLATION Bilateral 07/04/2013   Procedure: AV NODE ABLATION;  Surgeon: Evans Lance, MD;  Location: [facility] CATH LAB;  Service: Cardiovascular;  Laterality: Bilateral;  . BI-VENTRICULAR PACEMAKER INSERTION Bilateral 07/04/2013   Procedure: BI-VENTRICULAR PACEMAKER INSERTION (CRT-P);  Surgeon: Evans Lance, MD;  Location: [facility] CATH LAB;  Service: Cardiovascular;  Laterality: Bilateral;  . BIV PACEMAKER GENERATOR CHANGE OUT  11/2004  . COLONOSCOPY    . HERNIA REPAIR     left groin  . INSERT / REPLACE / REMOVE PACEMAKER    . INTRAOPERATIVE TRANSTHORACIC ECHOCARDIOGRAM N/A 04/09/2018   Procedure: INTRAOPERATIVE TRANSTHORACIC ECHOCARDIOGRAM;  Surgeon: Burnell Blanks, MD;  Location: [place];  Service: Open Heart Surgery;  Laterality: N/A;  . LEFT HEART CATHETERIZATION WITH CORONARY ANGIOGRAM N/A 07/07/2013   Procedure: LEFT HEART CATHETERIZATION WITH CORONARY ANGIOGRAM;  Surgeon: Pixie Casino, MD;  Location[facility] CATH LAB;  Service: Cardiovascular;  Laterality: N/A;  . PACEMAKER INSERTION  1993  . RIGHT/LEFT HEART CATH AND CORONARY ANGIOGRAPHY N/A 03/06/2018   Procedure: RIGHT/LEFT HEART CATH AND CORONARY ANGIOGRAPHY;  Surgeon: Burnell Blanks, MD;  Location: [place]  CV LAB;  Service: Cardiovascular;  Laterality: N/A;  . TONSILLECTOMY     adenoidectomy  . TRANSCATHETER AORTIC VALVE REPLACEMENT, TRANSFEMORAL  04/09/2018  . TRANSCATHETER AORTIC VALVE REPLACEMENT, TRANSFEMORAL Right 04/09/2018   Procedure: TRANSCATHETER AORTIC VALVE REPLACEMENT, TRANSFEMORAL. 15mm Edwards Sapien 3 THV.;  Surgeon: Burnell Blanks, MD;  Location: [place];  Service: Open Heart  Surgery;  Laterality: Right;    Current Medications: Current Meds  Medication Sig  . acetaminophen (TYLENOL) 325 MG tablet Take 1-2 tablets (325-650 mg total) by mouth every 4 (four) hours as needed for mild pain.  . carbidopa-levodopa (SINEMET IR) 25-100 MG per tablet Take 1 tablet by mouth 3 (three) times daily.  . diphenhydrAMINE (BENADRYL) 25 mg capsule Take 25 mg by mouth at bedtime as needed for allergies or sleep.  . furosemide (LASIX) 40 MG tablet Take 40 mg by mouth daily.  Marland Kitchen linagliptin (TRADJENTA) 5 MG TABS tablet Take 5 mg by mouth daily.  Marland Kitchen lisinopril (ZESTRIL) 5 MG tablet Take 1 tablet by mouth once daily  . metoprolol succinate (TOPROL-XL) 25 MG 24 hr tablet Take 25 mg by mouth daily. Take with or immediately following a meal.  . Multiple Vitamins-Minerals (PRESERVISION AREDS 2) CAPS Take 1 capsule by mouth 2 (two) times daily.   . Omega-3 Fatty Acids (FISH OIL) 1200 MG CAPS Take 1,200 mg by mouth 2 (two) times daily.  . vitamin E 400 UNIT capsule Take 400 Units by mouth daily.  Marland Kitchen warfarin (COUMADIN) 5 MG tablet Take 5 mg every day except take 2.5 mg on Monday and Friday     Allergies:   Amiodarone   Social History   Socioeconomic History  . Marital status: Widowed    Spouse name: Not on file  . Number of children: 2  . Years of education: 47  . Highest education level: Not on file  Occupational History  . Occupation: retired  Tobacco Use  . Smoking status: Former Smoker    Quit date: 07/09/1969    Years since quitting: 51.1  . Smokeless tobacco: Never Used  Vaping Use  . Vaping Use: Never used  Substance and Sexual Activity  . Alcohol use: No  . Drug use: No  . Sexual activity: Not on file  Other Topics Concern  . Not on file  Social History Narrative   Widower.  Retired Hydrographic surveyor.   Social Determinants of Health   Financial Resource Strain: Not on file  Food Insecurity: Not on file  Transportation Needs: Not on file  Physical Activity: Not on  file  Stress: Not on file  Social Connections: Not on file     Family History: The patient's family history includes Asthma in his father; Coronary artery disease in his mother; Stroke in his mother. ROS:   Please see the history of present illness.    All 14 point review of systems negative except as described per history of present illness  EKGs/Labs/Other Studies Reviewed:      Recent Labs: No results found for requested labs within last 8760 hours.  Recent Lipid Panel No results found for: CHOL, TRIG, HDL, CHOLHDL, VLDL, LDLCALC, LDLDIRECT  Physical Exam:    VS:  BP 126/68 (BP Location: Left Arm, Patient Position: Sitting)   Pulse 79   Ht 5\' 10"  (1.778 m)   Wt 154 lb (69.9 kg)   SpO2 94%   BMI 22.10 kg/m     Wt Readings from Last 3 Encounters:  08/30/20 154 lb (69.9 kg)  05/14/20 159 lb (72.1 kg)  04/05/20 158 lb 9.6 oz (71.9 kg)     GEN:  Well nourished, well developed in no acute distress HEENT: Normal NECK: No JVD; No carotid bruits LYMPHATICS: No lymphadenopathy CARDIAC: RRR, soft systolic murmur 1 out of 6 best heard right upper portion of the sternum, tones are distant, no rubs, no gallops RESPIRATORY:  Clear to auscultation without rales, wheezing or rhonchi  ABDOMEN: Soft, non-tender, non-distended MUSCULOSKELETAL:  No edema; No deformity  SKIN: Warm and dry LOWER EXTREMITIES: no swelling NEUROLOGIC:  Alert and oriented x 3 PSYCHIATRIC:  Normal affect   ASSESSMENT:    1. Permanent atrial fibrillation- AVN RFA 07/05/13   2. S/P TAVR (transcatheter aortic valve replacement)   3. SSS (sick sinus syndrome) ([place])   4. Current use of long term anticoagulation    PLAN:    In order of problems listed above:  1. Permanent atrial fibrillation, rate controlled, he is anticoagulated Coumadin which I will continue. 2. Status post TAVI done in 2019, stable, echocardiogram reviewed showing normal parameters, normal functioning of the valve. 3. Sick sinus  syndrome status post pacemaker implantation I did review interrogation of his device this is an Abbott device he does have 2.5 years left in device.  Continue monitoring. 4. Long-term use of anticoagulation he is taking Coumadin which I will continue. 5. Dyslipidemia I did review K PN which show me his LDL of 98 HDL 41.  He is not on any statin he is only on fish oil.  The value of statin in somebody was 87 years old is questionable.  I initiated conversation about potentially starting some medication he is not interested.   Medication Adjustments/Labs and Tests Ordered: Current medicines are reviewed at length with the patient today.  Concerns regarding medicines are outlined above.  Orders Placed This Encounter  Procedures  . EKG 12-Lead   Medication changes: No orders of the defined types were placed in this encounter.   Signed, Park Liter, MD, [facility] 08/30/2020 11:38 AM    [place]

## 2020-08-30 NOTE — Patient Instructions (Signed)

## 2020-08-30 NOTE — Progress Notes (Signed)
ekg 

## 2020-09-10 DIAGNOSIS — R791 Abnormal coagulation profile: Secondary | ICD-10-CM | POA: Diagnosis not present

## 2020-09-24 ENCOUNTER — Ambulatory Visit (INDEPENDENT_AMBULATORY_CARE_PROVIDER_SITE_OTHER): Payer: Medicare PPO

## 2020-09-24 DIAGNOSIS — I495 Sick sinus syndrome: Secondary | ICD-10-CM | POA: Diagnosis not present

## 2020-09-24 LAB — CUP PACEART REMOTE DEVICE CHECK
Battery Remaining Longevity: 31 mo
Battery Remaining Percentage: 33 %
Battery Voltage: 2.83 V
Date Time Interrogation Session: 20220318095029
Implantable Lead Implant Date: 20141226
Implantable Lead Implant Date: 20141226
Implantable Lead Location: 753858
Implantable Lead Location: 753860
Implantable Pulse Generator Implant Date: 20141226
Lead Channel Impedance Value: 410 Ohm
Lead Channel Impedance Value: 690 Ohm
Lead Channel Pacing Threshold Amplitude: 0.75 V
Lead Channel Pacing Threshold Amplitude: 1.5 V
Lead Channel Pacing Threshold Pulse Width: 0.4 ms
Lead Channel Pacing Threshold Pulse Width: 0.5 ms
Lead Channel Sensing Intrinsic Amplitude: 12 mV
Lead Channel Setting Pacing Amplitude: 2.5 V
Lead Channel Setting Pacing Amplitude: 2.5 V
Lead Channel Setting Pacing Pulse Width: 0.4 ms
Lead Channel Setting Pacing Pulse Width: 0.5 ms
Lead Channel Setting Sensing Sensitivity: 4 mV
Pulse Gen Model: 3242
Pulse Gen Serial Number: 7547478

## 2020-09-30 NOTE — Progress Notes (Signed)
Remote pacemaker transmission.   

## 2020-10-11 DIAGNOSIS — Z7901 Long term (current) use of anticoagulants: Secondary | ICD-10-CM | POA: Diagnosis not present

## 2020-11-15 DIAGNOSIS — E1165 Type 2 diabetes mellitus with hyperglycemia: Secondary | ICD-10-CM | POA: Diagnosis not present

## 2020-11-15 DIAGNOSIS — E785 Hyperlipidemia, unspecified: Secondary | ICD-10-CM | POA: Diagnosis not present

## 2020-11-15 DIAGNOSIS — Z7901 Long term (current) use of anticoagulants: Secondary | ICD-10-CM | POA: Diagnosis not present

## 2020-11-15 DIAGNOSIS — Z1331 Encounter for screening for depression: Secondary | ICD-10-CM | POA: Diagnosis not present

## 2020-11-15 DIAGNOSIS — E1129 Type 2 diabetes mellitus with other diabetic kidney complication: Secondary | ICD-10-CM | POA: Diagnosis not present

## 2020-11-15 DIAGNOSIS — Z139 Encounter for screening, unspecified: Secondary | ICD-10-CM | POA: Diagnosis not present

## 2020-11-15 DIAGNOSIS — I5022 Chronic systolic (congestive) heart failure: Secondary | ICD-10-CM | POA: Diagnosis not present

## 2020-11-15 DIAGNOSIS — N183 Chronic kidney disease, stage 3 unspecified: Secondary | ICD-10-CM | POA: Diagnosis not present

## 2020-11-15 DIAGNOSIS — Z9181 History of falling: Secondary | ICD-10-CM | POA: Diagnosis not present

## 2020-11-15 DIAGNOSIS — I482 Chronic atrial fibrillation, unspecified: Secondary | ICD-10-CM | POA: Diagnosis not present

## 2020-11-22 DIAGNOSIS — R791 Abnormal coagulation profile: Secondary | ICD-10-CM | POA: Diagnosis not present

## 2020-12-07 DIAGNOSIS — R791 Abnormal coagulation profile: Secondary | ICD-10-CM | POA: Diagnosis not present

## 2020-12-24 ENCOUNTER — Ambulatory Visit (INDEPENDENT_AMBULATORY_CARE_PROVIDER_SITE_OTHER): Payer: Medicare Other

## 2020-12-24 DIAGNOSIS — I495 Sick sinus syndrome: Secondary | ICD-10-CM | POA: Diagnosis not present

## 2020-12-24 LAB — CUP PACEART REMOTE DEVICE CHECK
Battery Remaining Longevity: 20 mo
Battery Remaining Percentage: 24 %
Battery Voltage: 2.8 V
Date Time Interrogation Session: 20220617105708
Implantable Lead Implant Date: 20141226
Implantable Lead Implant Date: 20141226
Implantable Lead Location: 753858
Implantable Lead Location: 753860
Implantable Pulse Generator Implant Date: 20141226
Lead Channel Impedance Value: 410 Ohm
Lead Channel Impedance Value: 750 Ohm
Lead Channel Pacing Threshold Amplitude: 0.75 V
Lead Channel Pacing Threshold Amplitude: 1.5 V
Lead Channel Pacing Threshold Pulse Width: 0.4 ms
Lead Channel Pacing Threshold Pulse Width: 0.5 ms
Lead Channel Sensing Intrinsic Amplitude: 12 mV
Lead Channel Setting Pacing Amplitude: 2.5 V
Lead Channel Setting Pacing Amplitude: 2.5 V
Lead Channel Setting Pacing Pulse Width: 0.4 ms
Lead Channel Setting Pacing Pulse Width: 0.5 ms
Lead Channel Setting Sensing Sensitivity: 4 mV
Pulse Gen Model: 3242
Pulse Gen Serial Number: 7547478

## 2020-12-27 ENCOUNTER — Telehealth: Payer: Self-pay | Admitting: Cardiovascular Disease

## 2020-12-27 NOTE — Telephone Encounter (Signed)
  1. Has your device fired? no  2. Is you device beeping? no  3. Are you experiencing draining or swelling at device site? no  4. Are you calling to see if we received your device transmission? no  5. Have you passed out? no  Patient was sick and was not able to send a remote transmission 12/24/20. Patient is going to send one this morning    Please route to [place]

## 2020-12-30 NOTE — Telephone Encounter (Signed)
Transmission received 12/27/20

## 2021-01-12 NOTE — Progress Notes (Signed)
Remote pacemaker transmission.   

## 2021-01-28 ENCOUNTER — Ambulatory Visit (INDEPENDENT_AMBULATORY_CARE_PROVIDER_SITE_OTHER): Payer: Medicare Other

## 2021-01-28 DIAGNOSIS — I495 Sick sinus syndrome: Secondary | ICD-10-CM

## 2021-01-31 LAB — CUP PACEART REMOTE DEVICE CHECK
Battery Remaining Longevity: 6 mo
Battery Remaining Percentage: 6 %
Battery Voltage: 2.78 V
Date Time Interrogation Session: 20220722091232
Implantable Lead Implant Date: 20141226
Implantable Lead Implant Date: 20141226
Implantable Lead Location: 753858
Implantable Lead Location: 753860
Implantable Pulse Generator Implant Date: 20141226
Lead Channel Impedance Value: 430 Ohm
Lead Channel Impedance Value: 810 Ohm
Lead Channel Pacing Threshold Amplitude: 0.75 V
Lead Channel Pacing Threshold Amplitude: 1.5 V
Lead Channel Pacing Threshold Pulse Width: 0.4 ms
Lead Channel Pacing Threshold Pulse Width: 0.5 ms
Lead Channel Sensing Intrinsic Amplitude: 9.1 mV
Lead Channel Setting Pacing Amplitude: 2.5 V
Lead Channel Setting Pacing Amplitude: 2.5 V
Lead Channel Setting Pacing Pulse Width: 0.4 ms
Lead Channel Setting Pacing Pulse Width: 0.5 ms
Lead Channel Setting Sensing Sensitivity: 4 mV
Pulse Gen Model: 3242
Pulse Gen Serial Number: 7547478

## 2021-02-09 DIAGNOSIS — R791 Abnormal coagulation profile: Secondary | ICD-10-CM | POA: Diagnosis not present

## 2021-02-17 NOTE — Addendum Note (Signed)
Addended by: Carylon Perches on: 02/17/2021 03:34 PM   Modules accepted: Level of Service

## 2021-02-17 NOTE — Progress Notes (Signed)
Remote pacemaker transmission.   

## 2021-02-23 DIAGNOSIS — R791 Abnormal coagulation profile: Secondary | ICD-10-CM | POA: Diagnosis not present

## 2021-02-28 ENCOUNTER — Ambulatory Visit (INDEPENDENT_AMBULATORY_CARE_PROVIDER_SITE_OTHER): Payer: Medicare Other

## 2021-02-28 DIAGNOSIS — I495 Sick sinus syndrome: Secondary | ICD-10-CM

## 2021-03-01 LAB — CUP PACEART REMOTE DEVICE CHECK
Battery Remaining Longevity: 5 mo
Battery Remaining Percentage: 5 %
Battery Voltage: 2.77 V
Date Time Interrogation Session: 20220822152447
Implantable Lead Implant Date: 20141226
Implantable Lead Implant Date: 20141226
Implantable Lead Location: 753858
Implantable Lead Location: 753860
Implantable Pulse Generator Implant Date: 20141226
Lead Channel Impedance Value: 430 Ohm
Lead Channel Impedance Value: 740 Ohm
Lead Channel Pacing Threshold Amplitude: 0.75 V
Lead Channel Pacing Threshold Amplitude: 1.5 V
Lead Channel Pacing Threshold Pulse Width: 0.4 ms
Lead Channel Pacing Threshold Pulse Width: 0.5 ms
Lead Channel Sensing Intrinsic Amplitude: 9.1 mV
Lead Channel Setting Pacing Amplitude: 2.5 V
Lead Channel Setting Pacing Amplitude: 2.5 V
Lead Channel Setting Pacing Pulse Width: 0.4 ms
Lead Channel Setting Pacing Pulse Width: 0.5 ms
Lead Channel Setting Sensing Sensitivity: 4 mV
Pulse Gen Model: 3242
Pulse Gen Serial Number: 7547478

## 2021-03-15 ENCOUNTER — Encounter: Payer: Self-pay | Admitting: Cardiology

## 2021-03-15 ENCOUNTER — Ambulatory Visit: Payer: Medicare Other | Admitting: Cardiology

## 2021-03-15 ENCOUNTER — Other Ambulatory Visit: Payer: Self-pay

## 2021-03-15 VITALS — BP 104/62 | HR 105 | Ht 70.5 in | Wt 150.6 lb

## 2021-03-15 DIAGNOSIS — Z952 Presence of prosthetic heart valve: Secondary | ICD-10-CM | POA: Diagnosis not present

## 2021-03-15 DIAGNOSIS — I10 Essential (primary) hypertension: Secondary | ICD-10-CM

## 2021-03-15 DIAGNOSIS — Z950 Presence of cardiac pacemaker: Secondary | ICD-10-CM

## 2021-03-15 DIAGNOSIS — E119 Type 2 diabetes mellitus without complications: Secondary | ICD-10-CM | POA: Diagnosis not present

## 2021-03-15 NOTE — Progress Notes (Signed)
Cardiology Office Note:    Date:  03/15/2021   ID:  [name] [name], DOB 06-Sep-1926, MRN [id number]  PCP:  Nicoletta Dress, MD  Cardiologist:  Jenne Campus, MD    Referring MD: Nicoletta Dress, MD   Chief Complaint  Patient presents with   Follow-up  I am doing fine  History of Present Illness:    [name] [name] is a 87 y.o. male   with past medical history significant for sick sinus syndrome, permanent atrial fibrillation, status post [facility] Jude CRT-P, nonischemic cardiomyopathy with ejection fraction of 50-55 based on last echocardiogram from June 15, 2020, chronic kidney failure, essential hypertension, status post TAVI done in October 2019 with 29 mm Edwards Safian 3 valve.  Also had AV nodal ablation done in 2014. Is coming today to my office for follow-up.  Overall he is doing well.  Denies have any chest pain tightness squeezing pressure burning chest no palpitations no dizziness still trying to be active after admit for somebody was 87 years old he looks good  Past Medical History:  Diagnosis Date   Acute on chronic combined systolic and diastolic CHF (congestive heart failure) ([place]) 06/29/2013   Atrial fibrillation (HCC)    echo- EF 35-40%; LA severely dilated; RA mod dilated, RV systolic pressure 33AQTM; LV systolic fcn mod reduced   Biventricular cardiac pacemaker upgrade 07/05/13 (St Jude) 06/29/2013   Cancer ([place])    skin cancer   Cardiomyopathy ([place]) 07/07/2013   Non Obstructive CAD by Cath    CHF (congestive heart failure) (HCC)    Critical aortic valve stenosis 06/29/2013   Current use of long term anticoagulation 06/29/2013   Diabetes mellitus without complication ([place])    Hypertension    Parkinson's disease ([place]) 07/07/2013   Permanent atrial fibrillation- AVN RFA 07/05/13 06/29/2013   Presence of permanent cardiac pacemaker    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   29 mm Edwards Sapien 3 transcatheter heart valve placed via  percutaneous right transfemoral approach    Severe aortic stenosis    SSS (sick sinus syndrome) ([place])    pacemaker placed in 1993   Tremors of nervous system     Past Surgical History:  Procedure Laterality Date   AV NODE ABLATION Bilateral 07/04/2013   Procedure: AV NODE ABLATION;  Surgeon: Evans Lance, MD;  [facility] CATH LAB;  Service: Cardiovascular;  Laterality: Bilateral;   BI-VENTRICULAR PACEMAKER INSERTION Bilateral 07/04/2013   Procedure: BI-VENTRICULAR PACEMAKER INSERTION (CRT-P);  Surgeon: Evans Lance, MD;  Location: [facility] CATH LAB;  Service: Cardiovascular;  Laterality: Bilateral;   BIV PACEMAKER GENERATOR CHANGE OUT  11/2004   COLONOSCOPY     HERNIA REPAIR     left groin   INSERT / REPLACE / REMOVE PACEMAKER     INTRAOPERATIVE TRANSTHORACIC ECHOCARDIOGRAM N/A 04/09/2018   Procedure: INTRAOPERATIVE TRANSTHORACIC ECHOCARDIOGRAM;  Surgeon: Burnell Blanks, MD;  Location: [place];  Service: Open Heart Surgery;  Laterality: N/A;   LEFT HEART CATHETERIZATION WITH CORONARY ANGIOGRAM N/A 07/07/2013   Procedure: LEFT HEART CATHETERIZATION WITH CORONARY ANGIOGRAM;  Surgeon: Pixie Casino, MD;  Location[facility] CATH LAB;  Service: Cardiovascular;  Laterality: N/A;   PACEMAKER INSERTION  1993   RIGHT/LEFT HEART CATH AND CORONARY ANGIOGRAPHY N/A 03/06/2018   Procedure: RIGHT/LEFT HEART CATH AND CORONARY ANGIOGRAPHY;  Surgeon: Burnell Blanks, MD;  Location: [place] CV LAB;  Service: Cardiovascular;  Laterality: N/A;   TONSILLECTOMY     adenoidectomy   TRANSCATHETER  AORTIC VALVE REPLACEMENT, TRANSFEMORAL  04/09/2018   TRANSCATHETER AORTIC VALVE REPLACEMENT, TRANSFEMORAL Right 04/09/2018   Procedure: TRANSCATHETER AORTIC VALVE REPLACEMENT, TRANSFEMORAL. 64mm Edwards Sapien 3 THV.;  Surgeon: Burnell Blanks, MD;  Location: [place];  Service: Open Heart Surgery;  Laterality: Right;    Current Medications: Current Meds  Medication Sig   acetaminophen (TYLENOL)  325 MG tablet Take 1-2 tablets (325-650 mg total) by mouth every 4 (four) hours as needed for mild pain.   carbidopa-levodopa (SINEMET IR) 25-100 MG per tablet Take 1 tablet by mouth 3 (three) times daily.   diphenhydrAMINE (BENADRYL) 25 mg capsule Take 25 mg by mouth at bedtime as needed for allergies or sleep.   furosemide (LASIX) 40 MG tablet Take 40 mg by mouth daily.   linagliptin (TRADJENTA) 5 MG TABS tablet Take 5 mg by mouth daily.   lisinopril (ZESTRIL) 5 MG tablet Take 1 tablet by mouth once daily (Patient taking differently: Take 5 mg by mouth daily.)   metoprolol succinate (TOPROL-XL) 25 MG 24 hr tablet Take 25 mg by mouth daily. Take with or immediately following a meal.   Multiple Vitamins-Minerals (PRESERVISION AREDS 2) CAPS Take 1 capsule by mouth 2 (two) times daily. Unknown strength   Omega-3 Fatty Acids (FISH OIL) 1200 MG CAPS Take 1,200 mg by mouth 2 (two) times daily.   vitamin E 400 UNIT capsule Take 400 Units by mouth daily.   warfarin (COUMADIN) 5 MG tablet Take 5 mg by mouth as directed. Take 5 mg every day except take 2.5 mg on Monday and Friday     Allergies:   Amiodarone   Social History   Socioeconomic History   Marital status: Widowed    Spouse name: Not on file   Number of children: 2   Years of education: 31   Highest education level: Not on file  Occupational History   Occupation: retired  Tobacco Use   Smoking status: Former    Types: Cigarettes    Quit date: 07/09/1969    Years since quitting: 51.7   Smokeless tobacco: Never  Vaping Use   Vaping Use: Never used  Substance and Sexual Activity   Alcohol use: No   Drug use: No   Sexual activity: Not on file  Other Topics Concern   Not on file  Social History Narrative   Widower.  Retired Hydrographic surveyor.   Social Determinants of Health   Financial Resource Strain: Not on file  Food Insecurity: Not on file  Transportation Needs: Not on file  Physical Activity: Not on file  Stress: Not on  file  Social Connections: Not on file     Family History: The patient's family history includes Asthma in his father; Coronary artery disease in his mother; Stroke in his mother. ROS:   Please see the history of present illness.    All 14 point review of systems negative except as described per history of present illness  EKGs/Labs/Other Studies Reviewed:      Recent Labs: No results found for requested labs within last 8760 hours.  Recent Lipid Panel No results found for: CHOL, TRIG, HDL, CHOLHDL, VLDL, LDLCALC, LDLDIRECT  Physical Exam:    VS:  BP 104/62 (BP Location: Left Arm, Patient Position: Sitting)   Pulse (!) 105   Ht 5' 10.5" (1.791 m)   Wt 150 lb 9.6 oz (68.3 kg)   SpO2 96%   BMI 21.30 kg/m     Wt Readings from Last 3 Encounters:  03/15/21  150 lb 9.6 oz (68.3 kg)  08/30/20 154 lb (69.9 kg)  05/14/20 159 lb (72.1 kg)     GEN:  Well nourished, well developed in no acute distress HEENT: Normal NECK: No JVD; No carotid bruits LYMPHATICS: No lymphadenopathy CARDIAC: RRR, no murmurs, no rubs, no gallops RESPIRATORY:  Clear to auscultation without rales, wheezing or rhonchi  ABDOMEN: Soft, non-tender, non-distended MUSCULOSKELETAL:  No edema; No deformity  SKIN: Warm and dry LOWER EXTREMITIES: no swelling NEUROLOGIC:  Alert and oriented x 3 PSYCHIATRIC:  Normal affect   ASSESSMENT:    1. S/P TAVR (transcatheter aortic valve replacement)   2. Biventricular cardiac pacemaker upgrade 07/05/13 (St Jude)   3. Diabetes mellitus without complication ([place])   4. Primary hypertension    PLAN:    In order of problems listed above:  Status post aortic valve replacement with TAVI, that was done in 2019.  He seems to be doing well I will schedule him to have echocardiogram to assess the valve.  We also need to check his left ventricle ejection fraction. Permanent atrial fibrillation rate controlled.  He is anticoagulated on Coumadin as per he wishes we will  continue. Dyslipidemia I did review his K PN which show me his LDL 74 HDL 39 this is from Nov 15, 2020 that is a good cholesterol profile we will continue present management.   Medication Adjustments/Labs and Tests Ordered: Current medicines are reviewed at length with the patient today.  Concerns regarding medicines are outlined above.  No orders of the defined types were placed in this encounter.  Medication changes: No orders of the defined types were placed in this encounter.   Signed, Park Liter, MD, [facility] 03/15/2021 11:28 AM    [place]

## 2021-03-15 NOTE — Patient Instructions (Signed)
Medication Instructions:  Your physician recommends that you continue on your current medications as directed. Please refer to the Current Medication list given to you today.  *If you need a refill on your cardiac medications before your next appointment, please call your pharmacy*   Lab Work: NONE If you have labs (blood work) drawn today and your tests are completely normal, you will receive your results only by: [place] (if you have MyChart) OR A paper copy in the mail If you have any lab test that is abnormal or we need to change your treatment, we will call you to review the results.   Testing/Procedures: Your physician has requested that you have an echocardiogram. Echocardiography is a painless test that uses sound waves to create images of your heart. It provides your doctor with information about the size and shape of your heart and how well your heart's chambers and valves are working. This procedure takes approximately one hour. There are no restrictions for this procedure.    Follow-Up: At [facility], you and your health needs are our priority.  As part of our continuing mission to provide you with exceptional heart care, we have created designated Provider Care Teams.  These Care Teams include your primary Cardiologist (physician) and Advanced Practice Providers (APPs -  Physician Assistants and Nurse Practitioners) who all work together to provide you with the care you need, when you need it.  We recommend signing up for the patient portal called "MyChart".  Sign up information is provided on this After Visit Summary.  MyChart is used to connect with patients for Virtual Visits (Telemedicine).  Patients are able to view lab/test results, encounter notes, upcoming appointments, etc.  Non-urgent messages can be sent to your provider as well.   To learn more about what you can do with MyChart, go to [email or web address].    Your next appointment:   6 month(s)  The  format for your next appointment:   In Person  Provider:   Jenne Campus, MD   Other Instructions

## 2021-03-16 NOTE — Progress Notes (Signed)
Remote pacemaker transmission.   

## 2021-03-28 ENCOUNTER — Other Ambulatory Visit: Payer: Self-pay

## 2021-03-28 ENCOUNTER — Ambulatory Visit (INDEPENDENT_AMBULATORY_CARE_PROVIDER_SITE_OTHER): Payer: Medicare Other

## 2021-03-28 DIAGNOSIS — Z952 Presence of prosthetic heart valve: Secondary | ICD-10-CM

## 2021-03-28 DIAGNOSIS — Z7901 Long term (current) use of anticoagulants: Secondary | ICD-10-CM | POA: Diagnosis not present

## 2021-03-28 LAB — ECHOCARDIOGRAM COMPLETE
AR max vel: 0.99 cm2
AV Area VTI: 1.09 cm2
AV Area mean vel: 1.06 cm2
AV Mean grad: 4.5 mmHg
AV Peak grad: 8.6 mmHg
Ao pk vel: 1.47 m/s
Area-P 1/2: 4.52 cm2
Calc EF: 44.3 %
S' Lateral: 3 cm
Single Plane A2C EF: 43.3 %
Single Plane A4C EF: 46.8 %

## 2021-03-29 ENCOUNTER — Telehealth: Payer: Self-pay

## 2021-03-29 NOTE — Telephone Encounter (Signed)
Tried calling patient. No answer and no voicemail set up for me to leave a message. 

## 2021-03-29 NOTE — Telephone Encounter (Signed)
-----   Message from Park Liter, MD sent at 03/29/2021  1:30 PM EDT ----- Echocardiogram showed lower limits of normal ejection fraction, moderate left ventricle hypertrophy moderate mitral valve regurgitation severely dilated left atrium moderately dilated right atrium, TAVR is functioning properly.  Overall looks fine

## 2021-03-30 DIAGNOSIS — R791 Abnormal coagulation profile: Secondary | ICD-10-CM | POA: Diagnosis not present

## 2021-03-31 ENCOUNTER — Ambulatory Visit (INDEPENDENT_AMBULATORY_CARE_PROVIDER_SITE_OTHER): Payer: Medicare Other

## 2021-03-31 DIAGNOSIS — I495 Sick sinus syndrome: Secondary | ICD-10-CM

## 2021-04-02 LAB — CUP PACEART REMOTE DEVICE CHECK
Battery Remaining Longevity: 4 mo
Battery Remaining Percentage: 4 %
Battery Voltage: 2.74 V
Date Time Interrogation Session: 20220922173837
Implantable Lead Implant Date: 20141226
Implantable Lead Implant Date: 20141226
Implantable Lead Location: 753858
Implantable Lead Location: 753860
Implantable Pulse Generator Implant Date: 20141226
Lead Channel Impedance Value: 400 Ohm
Lead Channel Impedance Value: 750 Ohm
Lead Channel Pacing Threshold Amplitude: 0.75 V
Lead Channel Pacing Threshold Amplitude: 1.5 V
Lead Channel Pacing Threshold Pulse Width: 0.4 ms
Lead Channel Pacing Threshold Pulse Width: 0.5 ms
Lead Channel Sensing Intrinsic Amplitude: 10.4 mV
Lead Channel Setting Pacing Amplitude: 2.5 V
Lead Channel Setting Pacing Amplitude: 2.5 V
Lead Channel Setting Pacing Pulse Width: 0.4 ms
Lead Channel Setting Pacing Pulse Width: 0.5 ms
Lead Channel Setting Sensing Sensitivity: 4 mV
Pulse Gen Model: 3242
Pulse Gen Serial Number: 7547478

## 2021-04-06 NOTE — Addendum Note (Signed)
Addended by: Douglass Rivers D on: 04/06/2021 12:16 PM   Modules accepted: Level of Service

## 2021-04-06 NOTE — Progress Notes (Signed)
Remote pacemaker transmission.   

## 2021-04-07 DIAGNOSIS — R791 Abnormal coagulation profile: Secondary | ICD-10-CM | POA: Diagnosis not present

## 2021-04-14 DIAGNOSIS — R791 Abnormal coagulation profile: Secondary | ICD-10-CM | POA: Diagnosis not present

## 2021-04-28 DIAGNOSIS — R791 Abnormal coagulation profile: Secondary | ICD-10-CM | POA: Diagnosis not present

## 2021-05-02 ENCOUNTER — Ambulatory Visit (INDEPENDENT_AMBULATORY_CARE_PROVIDER_SITE_OTHER): Payer: Medicare Other

## 2021-05-02 DIAGNOSIS — I495 Sick sinus syndrome: Secondary | ICD-10-CM

## 2021-05-02 LAB — CUP PACEART REMOTE DEVICE CHECK
Battery Remaining Longevity: 3 mo
Battery Remaining Percentage: 3 %
Battery Voltage: 2.71 V
Date Time Interrogation Session: 20221023035634
Implantable Lead Implant Date: 20141226
Implantable Lead Implant Date: 20141226
Implantable Lead Location: 753858
Implantable Lead Location: 753860
Implantable Pulse Generator Implant Date: 20141226
Lead Channel Impedance Value: 400 Ohm
Lead Channel Impedance Value: 740 Ohm
Lead Channel Pacing Threshold Amplitude: 0.75 V
Lead Channel Pacing Threshold Amplitude: 1.5 V
Lead Channel Pacing Threshold Pulse Width: 0.4 ms
Lead Channel Pacing Threshold Pulse Width: 0.5 ms
Lead Channel Sensing Intrinsic Amplitude: 10.5 mV
Lead Channel Setting Pacing Amplitude: 2.5 V
Lead Channel Setting Pacing Amplitude: 2.5 V
Lead Channel Setting Pacing Pulse Width: 0.4 ms
Lead Channel Setting Pacing Pulse Width: 0.5 ms
Lead Channel Setting Sensing Sensitivity: 4 mV
Pulse Gen Model: 3242
Pulse Gen Serial Number: 7547478

## 2021-05-10 NOTE — Progress Notes (Signed)
Remote pacemaker transmission.   

## 2021-05-18 DIAGNOSIS — I482 Chronic atrial fibrillation, unspecified: Secondary | ICD-10-CM | POA: Diagnosis not present

## 2021-05-18 DIAGNOSIS — E785 Hyperlipidemia, unspecified: Secondary | ICD-10-CM | POA: Diagnosis not present

## 2021-05-18 DIAGNOSIS — E119 Type 2 diabetes mellitus without complications: Secondary | ICD-10-CM | POA: Diagnosis not present

## 2021-05-18 DIAGNOSIS — R809 Proteinuria, unspecified: Secondary | ICD-10-CM | POA: Diagnosis not present

## 2021-05-18 DIAGNOSIS — N183 Chronic kidney disease, stage 3 unspecified: Secondary | ICD-10-CM | POA: Diagnosis not present

## 2021-05-18 DIAGNOSIS — I5022 Chronic systolic (congestive) heart failure: Secondary | ICD-10-CM | POA: Diagnosis not present

## 2021-05-23 ENCOUNTER — Encounter: Payer: Self-pay | Admitting: Cardiology

## 2021-05-23 ENCOUNTER — Other Ambulatory Visit: Payer: Self-pay

## 2021-05-23 ENCOUNTER — Ambulatory Visit (INDEPENDENT_AMBULATORY_CARE_PROVIDER_SITE_OTHER): Payer: Medicare Other | Admitting: Cardiology

## 2021-05-23 VITALS — BP 102/68 | HR 80 | Resp 18 | Ht 70.5 in | Wt 149.0 lb

## 2021-05-23 DIAGNOSIS — I4821 Permanent atrial fibrillation: Secondary | ICD-10-CM

## 2021-05-23 NOTE — Progress Notes (Signed)
Electrophysiology Office Note   Date:  05/23/2021   ID:  [name], DOB October 12, 1926, MRN [id number]  PCP:  Nicoletta Dress, MD  Cardiologist:  Agustin Cree Primary Electrophysiologist:  Daria Mcmeekin Meredith Leeds, MD    Chief Complaint: pacemaker   History of Present Illness: WOOD NOVACEK is a 87 y.o. male who is being seen today for the evaluation of pacemaker at the request of Nicoletta Dress, MD. Presenting today for electrophysiology evaluation.  He has a history significant for hypertension, sick sinus syndrome status post [facility] Jude CRT-P, atrial fibrillation,, nonischemic cardiomyopathy, hypertension, CKD stage III, severe aortic stenosis status post TAVR 04/09/2018.  He was diagnosed with atrial fibrillation 1995.  He had been on warfarin and underwent AV node ablation.  He has had a total of 3 pacemakers.  His most recent generator change was in 2014.  Today, denies symptoms of palpitations, chest pain, shortness of breath, orthopnea, PND, lower extremity edema, claudication, dizziness, presyncope, syncope, bleeding, or neurologic sequela. The patient is tolerating medications without difficulties.  Overall he is feeling well.  He has no chest pain or shortness of breath.  Is able to do all of his daily activities without restriction.  Device interrogation showed that he is nearing ERI.  He Kenzli Barritt need a generator change as he has dependence today.  He understands that we Dacey Milberger call him back once he requires change out.   Past Medical History:  Diagnosis Date   Acute on chronic combined systolic and diastolic CHF (congestive heart failure) ([place]) 06/29/2013   Atrial fibrillation (HCC)    echo- EF 35-40%; LA severely dilated; RA mod dilated, RV systolic pressure 85IOEV; LV systolic fcn mod reduced   Biventricular cardiac pacemaker upgrade 07/05/13 (St Jude) 06/29/2013   Cancer ([place])    skin cancer   Cardiomyopathy ([place]) 07/07/2013   Non Obstructive CAD by Cath    CHF (congestive  heart failure) (HCC)    Critical aortic valve stenosis 06/29/2013   Current use of long term anticoagulation 06/29/2013   Diabetes mellitus without complication ([place])    Hypertension    Parkinson's disease ([place]) 07/07/2013   Permanent atrial fibrillation- AVN RFA 07/05/13 06/29/2013   Presence of permanent cardiac pacemaker    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   29 mm Edwards Sapien 3 transcatheter heart valve placed via percutaneous right transfemoral approach    Severe aortic stenosis    SSS (sick sinus syndrome) ([place])    pacemaker placed in 1993   Tremors of nervous system    Past Surgical History:  Procedure Laterality Date   AV NODE ABLATION Bilateral 07/04/2013   Procedure: AV NODE ABLATION;  Surgeon: Evans Lance, MD;  Location: [facility] CATH LAB;  Service: Cardiovascular;  Laterality: Bilateral;   BI-VENTRICULAR PACEMAKER INSERTION Bilateral 07/04/2013   Procedure: BI-VENTRICULAR PACEMAKER INSERTION (CRT-P);  Surgeon: Evans Lance, MD;  Location: [facility] CATH LAB;  Service: Cardiovascular;  Laterality: Bilateral;   BIV PACEMAKER GENERATOR CHANGE OUT  11/2004   COLONOSCOPY     HERNIA REPAIR     left groin   INSERT / REPLACE / REMOVE PACEMAKER     INTRAOPERATIVE TRANSTHORACIC ECHOCARDIOGRAM N/A 04/09/2018   Procedure: INTRAOPERATIVE TRANSTHORACIC ECHOCARDIOGRAM;  Surgeon: Burnell Blanks, MD;  Location: [place];  Service: Open Heart Surgery;  Laterality: N/A;   LEFT HEART CATHETERIZATION WITH CORONARY ANGIOGRAM N/A 07/07/2013   Procedure: LEFT HEART CATHETERIZATION WITH CORONARY ANGIOGRAM;  Surgeon: Pixie Casino, MD;  Location: [facility]  CATH LAB;  Service: Cardiovascular;  Laterality: N/A;   PACEMAKER INSERTION  1993   RIGHT/LEFT HEART CATH AND CORONARY ANGIOGRAPHY N/A 03/06/2018   Procedure: RIGHT/LEFT HEART CATH AND CORONARY ANGIOGRAPHY;  Surgeon: Burnell Blanks, MD;  Location: [place] CV LAB;  Service: Cardiovascular;  Laterality: N/A;    TONSILLECTOMY     adenoidectomy   TRANSCATHETER AORTIC VALVE REPLACEMENT, TRANSFEMORAL  04/09/2018   TRANSCATHETER AORTIC VALVE REPLACEMENT, TRANSFEMORAL Right 04/09/2018   Procedure: TRANSCATHETER AORTIC VALVE REPLACEMENT, TRANSFEMORAL. 68mm Edwards Sapien 3 THV.;  Surgeon: Burnell Blanks, MD;  Location: [place];  Service: Open Heart Surgery;  Laterality: Right;     Current Outpatient Medications  Medication Sig Dispense Refill   acetaminophen (TYLENOL) 325 MG tablet Take 1-2 tablets (325-650 mg total) by mouth every 4 (four) hours as needed for mild pain.     carbidopa-levodopa (SINEMET IR) 25-100 MG per tablet Take 1 tablet by mouth 3 (three) times daily.     diphenhydrAMINE (BENADRYL) 25 mg capsule Take 25 mg by mouth at bedtime as needed for allergies or sleep.     furosemide (LASIX) 40 MG tablet Take 40 mg by mouth daily.     linagliptin (TRADJENTA) 5 MG TABS tablet Take 5 mg by mouth daily.     lisinopril (ZESTRIL) 5 MG tablet Take 1 tablet by mouth once daily (Patient taking differently: Take 5 mg by mouth daily.) 30 tablet 0   metoprolol succinate (TOPROL-XL) 25 MG 24 hr tablet Take 25 mg by mouth daily. Take with or immediately following a meal.     Multiple Vitamins-Minerals (PRESERVISION AREDS 2) CAPS Take 1 capsule by mouth 2 (two) times daily. Unknown strength     Omega-3 Fatty Acids (FISH OIL) 1200 MG CAPS Take 1,200 mg by mouth 2 (two) times daily.     vitamin E 400 UNIT capsule Take 400 Units by mouth daily.     warfarin (COUMADIN) 5 MG tablet Take 5 mg by mouth as directed. Take 5 mg every day except take 2.5 mg on Monday and Friday     No current facility-administered medications for this visit.    Allergies:   Amiodarone   Social History:  The patient  reports that he quit smoking about 51 years ago. His smoking use included cigarettes. He has never used smokeless tobacco. He reports that he does not drink alcohol and does not use drugs.   Family History:  The  patient's family history includes Asthma in his father; Coronary artery disease in his mother; Stroke in his mother.   ROS:  Please see the history of present illness.   Otherwise, review of systems is positive for none.   All other systems are reviewed and negative.   PHYSICAL EXAM: VS:  BP 102/68   Pulse 80   Resp 18   Ht 5' 10.5" (1.791 m)   Wt 149 lb (67.6 kg)   SpO2 97%   BMI 21.08 kg/m  , BMI Body mass index is 21.08 kg/m. GEN: Well nourished, well developed, in no acute distress  HEENT: normal  Neck: no JVD, carotid bruits, or masses Cardiac: RRR; no murmurs, rubs, or gallops,no edema  Respiratory:  clear to auscultation bilaterally, normal work of breathing GI: soft, nontender, nondistended, + BS MS: no deformity or atrophy  Skin: warm and dry, device site well healed Neuro:  Strength and sensation are intact Psych: euthymic mood, full affect  EKG:  EKG is ordered today. Personal review of the ekg  ordered shows AF, V paced  Personal review of the device interrogation today. Results in [place]: No results found for requested labs within last 8760 hours.    Lipid Panel  No results found for: CHOL, TRIG, HDL, CHOLHDL, VLDL, LDLCALC, LDLDIRECT   Wt Readings from Last 3 Encounters:  05/23/21 149 lb (67.6 kg)  03/15/21 150 lb 9.6 oz (68.3 kg)  08/30/20 154 lb (69.9 kg)      Other studies Reviewed: Additional studies/ records that were reviewed today include: TTE 03/28/21 Review of the above records today demonstrates:   1. Left ventricular ejection fraction, by estimation, is 50 to 55%. The  left ventricle has low normal function. The left ventricle has no regional  wall motion abnormalities. There is moderate left ventricular hypertrophy.  Left ventricular diastolic  parameters are indeterminate.   2. Right ventricular systolic function is normal. The right ventricular  size is normal. There is normal pulmonary artery systolic pressure.   3.  Left atrial size was severely dilated.   4. Right atrial size was moderately dilated.   5. The mitral valve is normal in structure. Moderate mitral valve  regurgitation. No evidence of mitral stenosis.   6. Patient is s/p TAVR with satisfactory valve function. Aortic valve  regurgitation is not visualized. No aortic stenosis is present.   7. The inferior vena cava is normal in size with greater than 50%  respiratory variability, suggesting right atrial pressure of 3 mmHg.    ASSESSMENT AND PLAN:  1.  Permanent atrial fibrillation: Currently on warfarin.  CHA2DS2-VASc of 4.  Is status post [employer] CRT-P.  Device functioning appropriately.  No changes at this time.  His device is nearing ERI.  He Rashelle Ireland need a generator change.  We have discussed the procedure.  Risks and benefits were discussed to bleeding and infection.  He understands these risks and is agreed to the procedure.  Once he reaches ERI, we Sammi Stolarz plan for generator change.  2.  Hypertension: Currently well controlled  3.  Chronic combined systolic and diastolic heart failure: Currently on lisinopril 5 mg daily, Toprol-XL 25 mg daily.  No obvious volume overload.  Continue with current management.  4.  Severe aortic stenosis: Status post TAVR.  Valve functioning well on most recent echo.  Plan for follow-up with primary cardiology.    Current medicines are reviewed at length with the patient today.   The patient does not have concerns regarding his medicines.  The following changes were made today: None  Labs/ tests ordered today include:  Orders Placed This Encounter  Procedures   EKG 12-Lead      Disposition:   FU with Simpson Paulos 1 year  Signed, Christiaan Strebeck Meredith Leeds, MD  05/23/2021 10:02 AM     [facility] [address] [place] [address] [phone] (office) [phone] (fax)

## 2021-05-23 NOTE — Patient Instructions (Signed)
Medication Instructions:  Your physician recommends that you continue on your current medications as directed. Please refer to the Current Medication list given to you today.  *If you need a refill on your cardiac medications before your next appointment, please call your pharmacy*   Lab Work: None ordered   Testing/Procedures: None ordered   Follow-Up: At [facility], you and your health needs are our priority.  As part of our continuing mission to provide you with exceptional heart care, we have created designated Provider Care Teams.  These Care Teams include your primary Cardiologist (physician) and Advanced Practice Providers (APPs -  Physician Assistants and Nurse Practitioners) who all work together to provide you with the care you need, when you need it.  We recommend signing up for the patient portal called "MyChart".  Sign up information is provided on this After Visit Summary.  MyChart is used to connect with patients for Virtual Visits (Telemedicine).  Patients are able to view lab/test results, encounter notes, upcoming appointments, etc.  Non-urgent messages can be sent to your provider as well.   To learn more about what you can do with MyChart, go to [email or web address].    Remote monitoring is used to monitor your Pacemaker or ICD from home. This monitoring reduces the number of office visits required to check your device to one time per year. It allows [place] to keep an eye on the functioning of your device to ensure it is working properly. You are scheduled for a device check from home on 06/08/2021. You may send your transmission at any time that day. If you have a wireless device, the transmission will be sent automatically. After your physician reviews your transmission, you will receive a postcard with your next transmission date.  Your next appointment:   1 year(s)  The format for your next appointment:   In Person  Provider:   Allegra Lai, MD   Thank you  for choosing [facility]!!   Trinidad Curet, RN [phone]

## 2021-05-30 DIAGNOSIS — Z7901 Long term (current) use of anticoagulants: Secondary | ICD-10-CM | POA: Diagnosis not present

## 2021-06-29 DIAGNOSIS — Z7901 Long term (current) use of anticoagulants: Secondary | ICD-10-CM | POA: Diagnosis not present

## 2021-07-05 ENCOUNTER — Ambulatory Visit (INDEPENDENT_AMBULATORY_CARE_PROVIDER_SITE_OTHER): Payer: Medicare Other

## 2021-07-05 DIAGNOSIS — I495 Sick sinus syndrome: Secondary | ICD-10-CM

## 2021-07-06 LAB — CUP PACEART REMOTE DEVICE CHECK
Battery Remaining Longevity: 1 mo
Battery Remaining Percentage: 1 %
Battery Voltage: 2.65 V
Date Time Interrogation Session: 20221228165638
Implantable Lead Implant Date: 20141226
Implantable Lead Implant Date: 20141226
Implantable Lead Location: 753858
Implantable Lead Location: 753860
Implantable Pulse Generator Implant Date: 20141226
Lead Channel Impedance Value: 430 Ohm
Lead Channel Impedance Value: 740 Ohm
Lead Channel Pacing Threshold Amplitude: 0.75 V
Lead Channel Pacing Threshold Amplitude: 1.5 V
Lead Channel Pacing Threshold Pulse Width: 0.4 ms
Lead Channel Pacing Threshold Pulse Width: 0.5 ms
Lead Channel Sensing Intrinsic Amplitude: 12 mV
Lead Channel Setting Pacing Amplitude: 2.5 V
Lead Channel Setting Pacing Amplitude: 2.5 V
Lead Channel Setting Pacing Pulse Width: 0.4 ms
Lead Channel Setting Pacing Pulse Width: 0.5 ms
Lead Channel Setting Sensing Sensitivity: 4 mV
Pulse Gen Model: 3242
Pulse Gen Serial Number: 7547478

## 2021-07-13 DIAGNOSIS — R791 Abnormal coagulation profile: Secondary | ICD-10-CM | POA: Diagnosis not present

## 2021-07-13 NOTE — Addendum Note (Signed)
Addended by: Cheri Kearns A on: 07/13/2021 09:31 AM   Modules accepted: Level of Service

## 2021-07-13 NOTE — Progress Notes (Signed)
Remote pacemaker transmission.   

## 2021-08-17 DIAGNOSIS — Z7901 Long term (current) use of anticoagulants: Secondary | ICD-10-CM | POA: Diagnosis not present

## 2021-08-24 DIAGNOSIS — R791 Abnormal coagulation profile: Secondary | ICD-10-CM | POA: Diagnosis not present

## 2021-09-05 ENCOUNTER — Telehealth: Payer: Self-pay

## 2021-09-05 ENCOUNTER — Ambulatory Visit (INDEPENDENT_AMBULATORY_CARE_PROVIDER_SITE_OTHER): Payer: Medicare Other

## 2021-09-05 DIAGNOSIS — I495 Sick sinus syndrome: Secondary | ICD-10-CM | POA: Diagnosis not present

## 2021-09-05 DIAGNOSIS — Z01812 Encounter for preprocedural laboratory examination: Secondary | ICD-10-CM

## 2021-09-05 LAB — CUP PACEART REMOTE DEVICE CHECK
Battery Remaining Longevity: 0 mo
Battery Voltage: 2.59 V
Date Time Interrogation Session: 20230227114016
Implantable Lead Implant Date: 20141226
Implantable Lead Implant Date: 20141226
Implantable Lead Location: 753858
Implantable Lead Location: 753860
Implantable Pulse Generator Implant Date: 20141226
Lead Channel Impedance Value: 390 Ohm
Lead Channel Impedance Value: 730 Ohm
Lead Channel Pacing Threshold Amplitude: 0.75 V
Lead Channel Pacing Threshold Amplitude: 1.5 V
Lead Channel Pacing Threshold Pulse Width: 0.4 ms
Lead Channel Pacing Threshold Pulse Width: 0.5 ms
Lead Channel Sensing Intrinsic Amplitude: 12 mV
Lead Channel Setting Pacing Amplitude: 2.5 V
Lead Channel Setting Pacing Amplitude: 2.5 V
Lead Channel Setting Pacing Pulse Width: 0.4 ms
Lead Channel Setting Pacing Pulse Width: 0.5 ms
Lead Channel Setting Sensing Sensitivity: 4 mV
Pulse Gen Model: 3242
Pulse Gen Serial Number: 7547478

## 2021-09-05 NOTE — Telephone Encounter (Signed)
Scheduled remote reviewed. Normal device function.   Device has reached ERI 1/29, 2.59V, ERI2.62V   Spoke with patient, advised of battery status.  LOV in November, gen changeout was discussed.    Device is running VVI 70 vs VVIR 70.  Advised patient he might notice some sluggishness with exertion since Rate response is no longer running.  He indicates he has not noticed any change.  Patient is device dependant  Pt requested that we call his daughter in-law, Jahzier Villalon to schedule procedure.  Her number is [phone]

## 2021-09-07 DIAGNOSIS — R791 Abnormal coagulation profile: Secondary | ICD-10-CM | POA: Diagnosis not present

## 2021-09-07 NOTE — Progress Notes (Signed)
Remote pacemaker transmission.   

## 2021-09-12 DIAGNOSIS — R791 Abnormal coagulation profile: Secondary | ICD-10-CM | POA: Diagnosis not present

## 2021-09-14 ENCOUNTER — Encounter: Payer: Self-pay | Admitting: Cardiology

## 2021-09-14 ENCOUNTER — Other Ambulatory Visit: Payer: Self-pay

## 2021-09-14 ENCOUNTER — Ambulatory Visit (INDEPENDENT_AMBULATORY_CARE_PROVIDER_SITE_OTHER): Payer: Medicare Other | Admitting: Cardiology

## 2021-09-14 VITALS — BP 92/58 | HR 56 | Ht 70.5 in | Wt 140.8 lb

## 2021-09-14 DIAGNOSIS — I4821 Permanent atrial fibrillation: Secondary | ICD-10-CM | POA: Diagnosis not present

## 2021-09-14 DIAGNOSIS — Z950 Presence of cardiac pacemaker: Secondary | ICD-10-CM | POA: Diagnosis not present

## 2021-09-14 DIAGNOSIS — Z952 Presence of prosthetic heart valve: Secondary | ICD-10-CM

## 2021-09-14 DIAGNOSIS — I420 Dilated cardiomyopathy: Secondary | ICD-10-CM

## 2021-09-14 DIAGNOSIS — I10 Essential (primary) hypertension: Secondary | ICD-10-CM | POA: Diagnosis not present

## 2021-09-14 DIAGNOSIS — Z7901 Long term (current) use of anticoagulants: Secondary | ICD-10-CM

## 2021-09-14 MED ORDER — FUROSEMIDE 20 MG PO TABS: 20.0000 mg | ORAL_TABLET | Freq: Every day | ORAL | 3 refills | Status: DC

## 2021-09-14 NOTE — Progress Notes (Signed)
Cardiology Office Note:    Date:  09/14/2021   ID:  [name] [name], DOB 26-Dec-1926, MRN [id number]  PCP:  Nicoletta Dress, MD  Cardiologist:  Jenne Campus, MD    Referring MD: Nicoletta Dress, MD   Chief Complaint  Patient presents with   Results    Echo   Pacemaker questions  Well  History of Present Illness:    [name] [name] is a 88 y.o. male  with past medical history significant for sick sinus syndrome, permanent atrial fibrillation, status post [facility] Jude CRT-P, nonischemic cardiomyopathy with ejection fraction of 50-55 based on last echocardiogram from September 2022, chronic kidney failure, essential hypertension, status post TAVI done in October 2019 with 29 mm Edwards Safian 3 valve.  Also had AV nodal ablation done in 2014. He comes today to my office for follow-up.  Overall he seems to be doing well.  He is legally blind therefore he cannot drive in spite of that he is still trying to work in the garden cut the grass.  Denies have any chest pain tightness squeezing pressure mid chest no palpitations dizziness no shortness of breath.  Past Medical History:  Diagnosis Date   Acute on chronic combined systolic and diastolic CHF (congestive heart failure) ([place]) 06/29/2013   Atrial fibrillation (HCC)    echo- EF 35-40%; LA severely dilated; RA mod dilated, RV systolic pressure 40HKVQ; LV systolic fcn mod reduced   Biventricular cardiac pacemaker upgrade 07/05/13 (St Jude) 06/29/2013   Cancer ([place])    skin cancer   Cardiomyopathy ([place]) 07/07/2013   Non Obstructive CAD by Cath    CHF (congestive heart failure) (HCC)    Critical aortic valve stenosis 06/29/2013   Current use of long term anticoagulation 06/29/2013   Diabetes mellitus without complication ([place])    Hypertension    Parkinson's disease ([place]) 07/07/2013   Permanent atrial fibrillation- AVN RFA 07/05/13 06/29/2013   Presence of permanent cardiac pacemaker    S/P TAVR (transcatheter aortic valve  replacement) 04/09/2018   29 mm Edwards Sapien 3 transcatheter heart valve placed via percutaneous right transfemoral approach    Severe aortic stenosis    SSS (sick sinus syndrome) ([place])    pacemaker placed in 1993   Tremors of nervous system     Past Surgical History:  Procedure Laterality Date   AV NODE ABLATION Bilateral 07/04/2013   Procedure: AV NODE ABLATION;  Surgeon: Evans Lance, MD;  Location: [facility] CATH LAB;  Service: Cardiovascular;  Laterality: Bilateral;   BI-VENTRICULAR PACEMAKER INSERTION Bilateral 07/04/2013   Procedure: BI-VENTRICULAR PACEMAKER INSERTION (CRT-P);  Surgeon: Evans Lance, MD;  Location: [facility] CATH LAB;  Service: Cardiovascular;  Laterality: Bilateral;   BIV PACEMAKER GENERATOR CHANGE OUT  11/2004   COLONOSCOPY     HERNIA REPAIR     left groin   INSERT / REPLACE / REMOVE PACEMAKER     INTRAOPERATIVE TRANSTHORACIC ECHOCARDIOGRAM N/A 04/09/2018   Procedure: INTRAOPERATIVE TRANSTHORACIC ECHOCARDIOGRAM;  Surgeon: Burnell Blanks, MD;  Location: [facility];  Service: Open Heart Surgery;  Laterality: N/A;   LEFT HEART CATHETERIZATION WITH CORONARY ANGIOGRAM N/A 07/07/2013   Procedure: LEFT HEART CATHETERIZATION WITH CORONARY ANGIOGRAM;  Surgeon: Pixie Casino, MD;  Location: [facility] CATH LAB;  Service: Cardiovascular;  Laterality: N/A;   PACEMAKER INSERTION  1993   RIGHT/LEFT HEART CATH AND CORONARY ANGIOGRAPHY N/A 03/06/2018   Procedure: RIGHT/LEFT HEART CATH AND CORONARY ANGIOGRAPHY;  Surgeon: Burnell Blanks, MD;  Location: [place] CV LAB;  Service: Cardiovascular;  Laterality: N/A;   TONSILLECTOMY     adenoidectomy   TRANSCATHETER AORTIC VALVE REPLACEMENT, TRANSFEMORAL  04/09/2018   TRANSCATHETER AORTIC VALVE REPLACEMENT, TRANSFEMORAL Right 04/09/2018   Procedure: TRANSCATHETER AORTIC VALVE REPLACEMENT, TRANSFEMORAL. 52m Edwards Sapien 3 THV.;  Surgeon: MBurnell Blanks MD;  [place]  Service: Open Heart Surgery;  Laterality: Right;     Current Medications: Current Meds  Medication Sig   acetaminophen (TYLENOL) 325 MG tablet Take 1-2 tablets (325-650 mg total) by mouth every 4 (four) hours as needed for mild pain.   carbidopa-levodopa (SINEMET IR) 25-100 MG per tablet Take 1 tablet by mouth 3 (three) times daily.   diphenhydrAMINE (BENADRYL) 25 mg capsule Take 25 mg by mouth at bedtime as needed for allergies or sleep.   furosemide (LASIX) 40 MG tablet Take 40 mg by mouth daily.   linagliptin (TRADJENTA) 5 MG TABS tablet Take 5 mg by mouth daily.   lisinopril (ZESTRIL) 5 MG tablet Take 1 tablet by mouth once daily (Patient taking differently: Take 5 mg by mouth daily.)   metoprolol succinate (TOPROL-XL) 25 MG 24 hr tablet Take 25 mg by mouth daily. Take with or immediately following a meal.   Multiple Vitamins-Minerals (PRESERVISION AREDS 2) CAPS Take 1 capsule by mouth 2 (two) times daily. Unknown strength   Omega-3 Fatty Acids (FISH OIL) 1200 MG CAPS Take 1,200 mg by mouth 2 (two) times daily.   vitamin E 400 UNIT capsule Take 400 Units by mouth daily.   warfarin (COUMADIN) 5 MG tablet Take 5 mg by mouth as directed. Take 5 mg every day except take 2.5 mg on Monday and Friday     Allergies:   Amiodarone   Social History   Socioeconomic History   Marital status: Widowed    Spouse name: Not on file   Number of children: 2   Years of education: 156  Highest education level: Not on file  Occupational History   Occupation: retired  Tobacco Use   Smoking status: Former    Types: Cigarettes    Quit date: 07/09/1969    Years since quitting: 52.2   Smokeless tobacco: Never  Vaping Use   Vaping Use: Never used  Substance and Sexual Activity   Alcohol use: No   Drug use: No   Sexual activity: Not on file  Other Topics Concern   Not on file  Social History Narrative   Widower.  Retired pHydrographic surveyor   Social Determinants of Health   Financial Resource Strain: Not on file  Food Insecurity: Not on file   Transportation Needs: Not on file  Physical Activity: Not on file  Stress: Not on file  Social Connections: Not on file     Family History: The patient's family history includes Asthma in his father; Coronary artery disease in his mother; Stroke in his mother. ROS:   Please see the history of present illness.    All 14 point review of systems negative except as described per history of present illness  EKGs/Labs/Other Studies Reviewed:      Recent Labs: No results found for requested labs within last 8760 hours.  Recent Lipid Panel No results found for: CHOL, TRIG, HDL, CHOLHDL, VLDL, LDLCALC, LDLDIRECT  Physical Exam:    VS:  BP (!) 92/58 (BP Location: Left Arm, Patient Position: Sitting)    Pulse (!) 56    Ht 5' 10.5" (1.791 m)    Wt 140 lb 12.8 oz (63.9 kg)  SpO2 95%    BMI 19.92 kg/m     Wt Readings from Last 3 Encounters:  09/14/21 140 lb 12.8 oz (63.9 kg)  05/23/21 149 lb (67.6 kg)  03/15/21 150 lb 9.6 oz (68.3 kg)     GEN:  Well nourished, well developed in no acute distress HEENT: Normal NECK: No JVD; No carotid bruits LYMPHATICS: No lymphadenopathy CARDIAC: RRR, soft systolic murmur grade 1/6 to 2/6 best heard right upper portion of the sternum., no rubs, no gallops RESPIRATORY:  Clear to auscultation without rales, wheezing or rhonchi  ABDOMEN: Soft, non-tender, non-distended MUSCULOSKELETAL:  No edema; No deformity  SKIN: Warm and dry LOWER EXTREMITIES: no swelling NEUROLOGIC:  Alert and oriented x 3 PSYCHIATRIC:  Normal affect   ASSESSMENT:    1. Permanent atrial fibrillation- AVN RFA 07/05/13   2. Dilated cardiomyopathy ([place])   3. S/P TAVR (transcatheter aortic valve replacement)   4. Current use of long term anticoagulation   5. Biventricular cardiac pacemaker upgrade 07/05/13 (St Jude)   6. Primary hypertension    PLAN:    In order of problems listed above:  Pacemaker present, the device is at [facility].  I will make sure that arrangements are  made for pacemaker replacement.  He did have AV nodal ablation done in 2014 therefore he is pacemaker dependent. History of dilated cardiomyopathy ejection fraction 50 to 55%.  On appropriate guideline directed medical therapy that he is able to tolerate I will continue. Dyslipidemia I did review his K PN which show LDL of 79 HDL 42.  He is on fish oil only which I will continue for now. Permanent atrial fibrillation.  He is on warfarin/Coumadin.  We discussed again potentially switching to more moderate medication but he is happy with his Coumadin.  We will continue Essential hypertension: Blood pressure actually on the lower side today.  I will cut down his Lasix to only 20 mg daily   Medication Adjustments/Labs and Tests Ordered: Current medicines are reviewed at length with the patient today.  Concerns regarding medicines are outlined above.  No orders of the defined types were placed in this encounter.  Medication changes: No orders of the defined types were placed in this encounter.   Signed, Park Liter, MD, [facility] 09/14/2021 11:42 AM    [place]

## 2021-09-14 NOTE — Patient Instructions (Signed)
Medication Instructions:  ?Your physician has recommended you make the following change in your medication:  ? ?START: Lasix 20 mg daily ? ?*If you need a refill on your cardiac medications before your next appointment, please call your pharmacy* ? ? ?Lab Work: ?None ?If you have labs (blood work) drawn today and your tests are completely normal, you will receive your results only by: ?MyChart Message (if you have MyChart) OR ?A paper copy in the mail ?If you have any lab test that is abnormal or we need to change your treatment, we will call you to review the results. ? ? ?Testing/Procedures: ?None ? ? ?Follow-Up: ?At [facility], you and your health needs are our priority.  As part of our continuing mission to provide you with exceptional heart care, we have created designated Provider Care Teams.  These Care Teams include your primary Cardiologist (physician) and Advanced Practice Providers (APPs -  Physician Assistants and Nurse Practitioners) who all work together to provide you with the care you need, when you need it. ? ?We recommend signing up for the patient portal called "MyChart".  Sign up information is provided on this After Visit Summary.  MyChart is used to connect with patients for Virtual Visits (Telemedicine).  Patients are able to view lab/test results, encounter notes, upcoming appointments, etc.  Non-urgent messages can be sent to your provider as well.   ?To learn more about what you can do with MyChart, go to [email or web address].   ? ?Your next appointment:   ?6 month(s) ? ?The format for your next appointment:   ?In Person ? ?Provider:   ?Jenne Campus, MD  ? ? ?Other Instructions ?None ? ?

## 2021-09-16 NOTE — Telephone Encounter (Signed)
Called dtr-in-law, Girtha Hake, per pt request. ?Scheduled BiV PPM gen change for 3/29. ?Aware I will be in touch with instructions. ?She is agreeable to plan. ?

## 2021-09-21 DIAGNOSIS — R791 Abnormal coagulation profile: Secondary | ICD-10-CM | POA: Diagnosis not present

## 2021-09-27 NOTE — Telephone Encounter (Signed)
Spoke to [place]. ?Reviewed procedure instructions with dtr-in-law.  Aware NPO after MN night before procedure, hold all medications the morning of procedure, arrive to [facility] main entrance at 12:30 on 3/29 for BiV PPM generator change. ?Pt will stop by the [place] office by end of this week for pre procedure blood work. ?Dtr-in-law verbalized understanding and agreeable to plan.  ? ?

## 2021-09-27 NOTE — Addendum Note (Signed)
Addended by: Stanton Kidney on: 09/27/2021 03:40 PM ? ? Modules accepted: Orders ? ?

## 2021-09-29 DIAGNOSIS — Z01812 Encounter for preprocedural laboratory examination: Secondary | ICD-10-CM | POA: Diagnosis not present

## 2021-09-29 DIAGNOSIS — I495 Sick sinus syndrome: Secondary | ICD-10-CM | POA: Diagnosis not present

## 2021-09-29 LAB — CBC
Hematocrit: 43.1 % (ref 37.5–51.0)
Hemoglobin: 14.2 g/dL (ref 13.0–17.7)
MCH: 30.5 pg (ref 26.6–33.0)
MCHC: 32.9 g/dL (ref 31.5–35.7)
MCV: 93 fL (ref 79–97)
Platelets: 142 10*3/uL — ABNORMAL LOW (ref 150–450)
RBC: 4.65 x10E6/uL (ref 4.14–5.80)
RDW: 13.5 % (ref 11.6–15.4)
WBC: 9.3 10*3/uL (ref 3.4–10.8)

## 2021-09-29 LAB — BASIC METABOLIC PANEL
BUN/Creatinine Ratio: 15 (ref 10–24)
BUN: 28 mg/dL (ref 10–36)
CO2: 26 mmol/L (ref 20–29)
Calcium: 8.9 mg/dL (ref 8.6–10.2)
Chloride: 108 mmol/L — ABNORMAL HIGH (ref 96–106)
Creatinine, Ser: 1.86 mg/dL — ABNORMAL HIGH (ref 0.76–1.27)
Glucose: 96 mg/dL (ref 70–99)
Potassium: 4.2 mmol/L (ref 3.5–5.2)
Sodium: 146 mmol/L — ABNORMAL HIGH (ref 134–144)
eGFR: 34 mL/min/{1.73_m2} — ABNORMAL LOW (ref >59)

## 2021-09-29 LAB — PROTIME-INR
INR: 1.7 — ABNORMAL HIGH (ref 0.9–1.2)
Prothrombin Time: 17.2 s — ABNORMAL HIGH (ref 9.1–12.0)

## 2021-10-03 ENCOUNTER — Encounter: Payer: Self-pay | Admitting: Cardiology

## 2021-10-04 DIAGNOSIS — R791 Abnormal coagulation profile: Secondary | ICD-10-CM | POA: Diagnosis not present

## 2021-10-04 NOTE — Pre-Procedure Instructions (Signed)
Instructed patient on the following items: ?Arrival time 1230 ?Nothing to eat or drink after midnight ?No meds AM of procedure ?Responsible person to drive you home and stay with you for 24 hrs ?Wash with special soap night before and morning of procedure ?If on anti-coagulant drug instructions Coumadin- don't take dose tonight  ?

## 2021-10-05 ENCOUNTER — Encounter (HOSPITAL_COMMUNITY)
Admission: RE | Disposition: A | Discharge status: Home / Self Care | Payer: Self-pay | Source: Home / Self Care | Attending: Cardiology

## 2021-10-05 ENCOUNTER — Ambulatory Visit (HOSPITAL_COMMUNITY)
Admission: RE | Admit: 2021-10-05 | Discharge: 2021-10-05 | Disposition: A | Discharge status: Home / Self Care | Payer: Medicare Other | Attending: Cardiology | Admitting: Cardiology

## 2021-10-05 ENCOUNTER — Other Ambulatory Visit: Payer: Self-pay

## 2021-10-05 DIAGNOSIS — Z87891 Personal history of nicotine dependence: Secondary | ICD-10-CM | POA: Insufficient documentation

## 2021-10-05 DIAGNOSIS — I130 Hypertensive heart and chronic kidney disease with heart failure and stage 1 through stage 4 chronic kidney disease, or unspecified chronic kidney disease: Secondary | ICD-10-CM | POA: Insufficient documentation

## 2021-10-05 DIAGNOSIS — Z952 Presence of prosthetic heart valve: Secondary | ICD-10-CM | POA: Insufficient documentation

## 2021-10-05 DIAGNOSIS — I495 Sick sinus syndrome: Secondary | ICD-10-CM | POA: Insufficient documentation

## 2021-10-05 DIAGNOSIS — I4821 Permanent atrial fibrillation: Secondary | ICD-10-CM | POA: Insufficient documentation

## 2021-10-05 DIAGNOSIS — Z45010 Encounter for checking and testing of cardiac pacemaker pulse generator [battery]: Secondary | ICD-10-CM | POA: Insufficient documentation

## 2021-10-05 DIAGNOSIS — N183 Chronic kidney disease, stage 3 unspecified: Secondary | ICD-10-CM | POA: Diagnosis not present

## 2021-10-05 DIAGNOSIS — E1122 Type 2 diabetes mellitus with diabetic chronic kidney disease: Secondary | ICD-10-CM | POA: Diagnosis not present

## 2021-10-05 DIAGNOSIS — I428 Other cardiomyopathies: Secondary | ICD-10-CM | POA: Diagnosis not present

## 2021-10-05 DIAGNOSIS — G20 Parkinson's disease: Secondary | ICD-10-CM | POA: Insufficient documentation

## 2021-10-05 DIAGNOSIS — I5043 Acute on chronic combined systolic (congestive) and diastolic (congestive) heart failure: Secondary | ICD-10-CM | POA: Diagnosis present

## 2021-10-05 DIAGNOSIS — I350 Nonrheumatic aortic (valve) stenosis: Secondary | ICD-10-CM | POA: Insufficient documentation

## 2021-10-05 DIAGNOSIS — I5042 Chronic combined systolic (congestive) and diastolic (congestive) heart failure: Secondary | ICD-10-CM | POA: Diagnosis not present

## 2021-10-05 HISTORY — PX: BIV PACEMAKER GENERATOR CHANGEOUT: EP1198

## 2021-10-05 LAB — PROTIME-INR
INR: 2.1 — ABNORMAL HIGH (ref 0.8–1.2)
Prothrombin Time: 23.2 seconds — ABNORMAL HIGH (ref 11.4–15.2)

## 2021-10-05 SURGERY — BIV PACEMAKER GENERATOR CHANGEOUT

## 2021-10-05 MED ORDER — SODIUM CHLORIDE 0.9 % IV SOLN
80.0000 mg | INTRAVENOUS | Status: AC
  Administered 2021-10-05: 80 mg

## 2021-10-05 MED ORDER — LIDOCAINE HCL (PF) 1 % IJ SOLN
INTRAMUSCULAR | Status: AC
  Filled 2021-10-05: qty 60

## 2021-10-05 MED ORDER — LIDOCAINE HCL (PF) 1 % IJ SOLN
INTRAMUSCULAR | Status: DC | PRN
  Administered 2021-10-05: 40 mL

## 2021-10-05 MED ORDER — CHLORHEXIDINE GLUCONATE 4 % EX LIQD: 60.0000 mL | Freq: Once | CUTANEOUS | Status: DC

## 2021-10-05 MED ORDER — SODIUM CHLORIDE 0.9 % IV SOLN: INTRAVENOUS | Status: DC

## 2021-10-05 MED ORDER — CEFAZOLIN SODIUM-DEXTROSE 2-4 GM/100ML-% IV SOLN
INTRAVENOUS | Status: AC
  Filled 2021-10-05: qty 100

## 2021-10-05 MED ORDER — SODIUM CHLORIDE 0.9 % IV SOLN
INTRAVENOUS | Status: AC
  Filled 2021-10-05: qty 2

## 2021-10-05 MED ORDER — CEFAZOLIN SODIUM-DEXTROSE 2-4 GM/100ML-% IV SOLN
2.0000 g | INTRAVENOUS | Status: AC
  Administered 2021-10-05: 2 g via INTRAVENOUS

## 2021-10-05 SURGICAL SUPPLY — 5 items
CABLE SURGICAL S-101-97-12 (CABLE) ×2 IMPLANT
PACEMAKER QUDR ALLR CRT PM3562 (Pacemaker) IMPLANT
PAD DEFIB RADIO PHYSIO CONN (PAD) ×2 IMPLANT
PMKR QUADRA ALLURE CRT PM3562 (Pacemaker) ×2 IMPLANT
TRAY PACEMAKER INSERTION (PACKS) ×2 IMPLANT

## 2021-10-05 NOTE — Discharge Instructions (Signed)

## 2021-10-05 NOTE — H&P (Signed)
? ?Electrophysiology Office Note ? ? ?Date:  10/05/2021  ? ?ID:  [name], DOB [birth year], MRN [id number] ? ?PCP:  Nicoletta Dress, MD  ?Cardiologist:  Agustin Cree ?Primary Electrophysiologist:  Onesimo Lingard Meredith Leeds, MD   ? ?Chief Complaint: pacemaker ?  ?History of Present Illness: ?[name] [name] is a 88 y.o. male who is being seen today for the evaluation of pacemaker at the request of No ref. provider found. Presenting today for electrophysiology evaluation. ? ?He has a history significant for hypertension, sick sinus syndrome status post [facility] Jude CRT-P, atrial fibrillation,, nonischemic cardiomyopathy, hypertension, CKD stage III, severe aortic stenosis status post TAVR 04/09/2018.  He was diagnosed with atrial fibrillation 1995.  He had been on warfarin and underwent AV node ablation.  He has had a total of 3 pacemakers.  His most recent generator change was in 2014. ? ?Today, denies symptoms of palpitations, chest pain, shortness of breath, orthopnea, PND, lower extremity edema, claudication, dizziness, presyncope, syncope, bleeding, or neurologic sequela. The patient is tolerating medications without difficulties. Plan for pacemaker generator chagne today.  ? ? ?Past Medical History:  ?Diagnosis Date  ? Acute on chronic combined systolic and diastolic CHF (congestive heart failure) ([place]) 06/29/2013  ? Atrial fibrillation ([place])   ? echo- EF 35-40%; LA severely dilated; RA mod dilated, RV systolic pressure 65KPTW; LV systolic fcn mod reduced  ? Biventricular cardiac pacemaker upgrade 07/05/13 (St Jude) 06/29/2013  ? Cancer [facility])   ? skin cancer  ? Cardiomyopathy ([place]) 07/07/2013  ? Non Obstructive CAD by Cath   ? CHF (congestive heart failure) ([place])   ? Critical aortic valve stenosis 06/29/2013  ? Current use of long term anticoagulation 06/29/2013  ? Diabetes mellitus without complication ([place])   ? Hypertension   ? Parkinson's disease ([place]) 07/07/2013  ? Permanent atrial fibrillation- AVN RFA 07/05/13  06/29/2013  ? Presence of permanent cardiac pacemaker   ? 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 29 mm Edwards Sapien 3 transcatheter heart valve placed via percutaneous right transfemoral approach   ? Severe aortic stenosis   ? SSS (sick sinus syndrome) ([place])   ? pacemaker placed in 1993  ? Tremors of nervous system   ? ?Past Surgical History:  ?Procedure Laterality Date  ? AV NODE ABLATION Bilateral 07/04/2013  ? Procedure: AV NODE ABLATION;  Surgeon: Evans Lance, MD;  Location: [facility] CATH LAB;  Service: Cardiovascular;  Laterality: Bilateral;  ? BI-VENTRICULAR PACEMAKER INSERTION Bilateral 07/04/2013  ? Procedure: BI-VENTRICULAR PACEMAKER INSERTION (CRT-P);  Surgeon: Evans Lance, MD;  Location: [facility] CATH LAB;  Service: Cardiovascular;  Laterality: Bilateral;  ? BIV PACEMAKER GENERATOR CHANGE OUT  11/2004  ? COLONOSCOPY    ? HERNIA REPAIR    ? left groin  ? INSERT / REPLACE / REMOVE PACEMAKER    ? INTRAOPERATIVE TRANSTHORACIC ECHOCARDIOGRAM N/A 04/09/2018  ? Procedure: INTRAOPERATIVE TRANSTHORACIC ECHOCARDIOGRAM;  Surgeon: Burnell Blanks, MD;  Location: [place];  Service: Open Heart Surgery;  Laterality: N/A;  ? LEFT HEART CATHETERIZATION WITH CORONARY ANGIOGRAM N/A 07/07/2013  ? Procedure: LEFT HEART CATHETERIZATION WITH CORONARY ANGIOGRAM;  Surgeon: Pixie Casino, MD;  Location: [facility] CATH LAB;  Service: Cardiovascular;  Laterality: N/A;  ? [place]  ? RIGHT/LEFT HEART CATH AND CORONARY ANGIOGRAPHY N/A 03/06/2018  ? Procedure: RIGHT/LEFT HEART CATH AND CORONARY ANGIOGRAPHY;  Surgeon: Burnell Blanks, MD;  Location: [place] CV LAB;  Service: Cardiovascular;  Laterality: N/A;  ? TONSILLECTOMY    ?  adenoidectomy  ? TRANSCATHETER AORTIC VALVE REPLACEMENT, TRANSFEMORAL  04/09/2018  ? TRANSCATHETER AORTIC VALVE REPLACEMENT, TRANSFEMORAL Right 04/09/2018  ? Procedure: TRANSCATHETER AORTIC VALVE REPLACEMENT, TRANSFEMORAL. 39m Edwards Sapien 3 THV.;  Surgeon:  MBurnell Blanks MD;  [place] [employer]  Service: Open Heart Surgery;  Laterality: Right;  ? ? ? ?Current Facility-Administered Medications  ?Medication Dose Route Frequency Provider Last Rate Last Admin  ? 0.9 %  sodium chloride infusion   Intravenous Continuous CConstance Haw MD 50 mL/hr at 10/05/21 1245 New Bag at 10/05/21 1245  ? ceFAZolin (ANCEF) IVPB 2g/100 mL premix  2 g Intravenous On Call Robi Dewolfe MHassell Done MD      ? chlorhexidine (HIBICLENS) 4 % liquid 4 application.  60 mL Topical Once Nikkole Placzek MHassell Done MD      ? chlorhexidine (HIBICLENS) 4 % liquid 4 application.  60 mL Topical Once Mozell Hardacre MHassell Done MD      ? gentamicin (GARAMYCIN) 80 mg in sodium chloride 0.9 % 500 mL irrigation  80 mg Irrigation On Call CConstance Haw MD      ? ? ?Allergies:   Amiodarone  ? ?Social History:  The patient  reports that he quit smoking about 52 years ago. His smoking use included cigarettes. He has never used smokeless tobacco. He reports that he does not drink alcohol and does not use drugs.  ? ?Family History:  The patient's family history includes Asthma in his father; Coronary artery disease in his mother; Stroke in his mother.  ? ?ROS:  Please see the history of present illness.   Otherwise, review of systems is positive for none.   All other systems are reviewed and negative.  ? ?PHYSICAL EXAM: ?VS:  BP (!) 152/81   Pulse 62   Temp 98.2 ?F (36.8 ?C) (Oral)   Ht 5' 10.5" (1.791 m)   Wt 74.8 kg   SpO2 98%   BMI 23.34 kg/m?  , BMI Body mass index is 23.34 kg/m?. ?GEN: Well nourished, well developed, in no acute distress  ?HEENT: normal  ?Neck: no JVD, carotid bruits, or masses ?Cardiac: RRR; no murmurs, rubs, or gallops,no edema  ?Respiratory:  clear to auscultation bilaterally, normal work of breathing ?GI: soft, nontender, nondistended, + BS ?MS: no deformity or atrophy  ?Skin: warm and dry, device site well healed ?Neuro:  Strength and sensation are intact ?Psych: euthymic  mood, full affect ? ? ?Recent Labs: ?09/29/2021: BUN 28; Creatinine, Ser 1.86; Hemoglobin 14.2; Platelets 142; Potassium 4.2; Sodium 146  ? ? ?Lipid Panel  ?No results found for: CHOL, TRIG, HDL, CHOLHDL, VLDL, LDLCALC, LDLDIRECT ? ? ?Wt Readings from Last 3 Encounters:  ?10/05/21 74.8 kg  ?09/14/21 63.9 kg  ?05/23/21 67.6 kg  ?  ? ? ?Other studies Reviewed: ?Additional studies/ records that were reviewed today include: TTE 03/28/21 ?Review of the above records today demonstrates:  ? 1. Left ventricular ejection fraction, by estimation, is 50 to 55%. The  ?left ventricle has low normal function. The left ventricle has no regional  ?wall motion abnormalities. There is moderate left ventricular hypertrophy.  ?Left ventricular diastolic  ?parameters are indeterminate.  ? 2. Right ventricular systolic function is normal. The right ventricular  ?size is normal. There is normal pulmonary artery systolic pressure.  ? 3. Left atrial size was severely dilated.  ? 4. Right atrial size was moderately dilated.  ? 5. The mitral valve is normal in structure. Moderate mitral valve  ?regurgitation. No evidence of mitral stenosis.  ?  6. Patient is s/p TAVR with satisfactory valve function. Aortic valve  ?regurgitation is not visualized. No aortic stenosis is present.  ? 7. The inferior vena cava is normal in size with greater than 50%  ?respiratory variability, suggesting right atrial pressure of 3 mmHg.  ? ? ?ASSESSMENT AND PLAN: ? ?1.  Permanent atrial fibrillation: BRYTON ROMAGNOLI has presented today for surgery, with the diagnosis of pacemaker ERI.  The various methods of treatment have been discussed with the patient and family. After consideration of risks, benefits and other options for treatment, the patient has consented to  Procedure(s): ?Pacemaker generator change as a surgical intervention .  Risks include but not limited to bleeding, infection, pneumothorax, perforation, tamponade, vascular damage, renal failure, MI,  stroke, death, and lead dislodgement . The patient's history has been reviewed, patient examined, no change in status, stable for surgery.  I have reviewed the patient's chart and labs.  Questions were answered to the pat

## 2021-10-06 ENCOUNTER — Encounter (HOSPITAL_COMMUNITY): Payer: Self-pay | Admitting: Cardiology

## 2021-10-06 LAB — GLUCOSE, CAPILLARY: Glucose-Capillary: 80 mg/dL (ref 70–99)

## 2021-10-19 ENCOUNTER — Ambulatory Visit (INDEPENDENT_AMBULATORY_CARE_PROVIDER_SITE_OTHER): Payer: Medicare Other

## 2021-10-19 DIAGNOSIS — I4821 Permanent atrial fibrillation: Secondary | ICD-10-CM

## 2021-10-19 LAB — CUP PACEART INCLINIC DEVICE CHECK
Battery Remaining Longevity: 100 mo
Battery Voltage: 3.1 V
Brady Statistic RA Percent Paced: 0 %
Brady Statistic RV Percent Paced: 99.87 %
Date Time Interrogation Session: 20230412132601
Implantable Lead Implant Date: 20141226
Implantable Lead Implant Date: 20141226
Implantable Lead Location: 753858
Implantable Lead Location: 753860
Implantable Pulse Generator Implant Date: 20230329
Lead Channel Impedance Value: 400 Ohm
Lead Channel Impedance Value: 725 Ohm
Lead Channel Pacing Threshold Amplitude: 0.875 V
Lead Channel Pacing Threshold Amplitude: 1.125 V
Lead Channel Pacing Threshold Pulse Width: 0.4 ms
Lead Channel Pacing Threshold Pulse Width: 0.5 ms
Lead Channel Setting Pacing Amplitude: 2 V
Lead Channel Setting Pacing Amplitude: 2.125
Lead Channel Setting Pacing Pulse Width: 0.4 ms
Lead Channel Setting Pacing Pulse Width: 0.5 ms
Lead Channel Setting Sensing Sensitivity: 4 mV
Pulse Gen Model: 3562
Pulse Gen Serial Number: 8062428

## 2021-10-19 NOTE — Patient Instructions (Signed)
? ?  After Your Pacemaker ? ? ?Monitor your pacemaker site for redness, swelling, and drainage. Call the device clinic at [phone] if you experience these symptoms or fever/chills. ? ?Your incision was closed with Steri-strips or staples:  You may shower 7 days after your procedure and wash your incision with soap and water. Avoid lotions, ointments, or perfumes over your incision until it is well-healed. ? ?You may use a hot tub or a pool after your wound check appointment if the incision is completely closed. ? ? ?You may drive, unless driving has been restricted by your healthcare providers. ? ?Your Pacemaker is not MRI compatible. ? ?Remote monitoring is used to monitor your pacemaker from home. This monitoring is scheduled every 91 days by our office. It allows [place] to keep an eye on the functioning of your device to ensure it is working properly. You will routinely see your Electrophysiologist annually (more often if necessary).  ?

## 2021-10-19 NOTE — Progress Notes (Signed)
Wound check appointment. Steri-strips removed. Wound without redness or edema. Incision edges approximated, wound well healed. Normal device function. Thresholds, sensing, and impedances consistent with implant measurements. Device programmed at chronic lead settings. Chronic AF.  Histogram distribution appropriate for patient and level of activity. No ventricular arrhythmias noted. Patient educated about wound care, arm mobility, lifting restrictions, shock plan. Enrolled in remote monitoring with next transmission scheduled 12/06/21. ROV in 3 months with implanting physician. ?

## 2021-11-02 DIAGNOSIS — R791 Abnormal coagulation profile: Secondary | ICD-10-CM | POA: Diagnosis not present

## 2021-11-16 DIAGNOSIS — I482 Chronic atrial fibrillation, unspecified: Secondary | ICD-10-CM | POA: Diagnosis not present

## 2021-11-16 DIAGNOSIS — N183 Chronic kidney disease, stage 3 unspecified: Secondary | ICD-10-CM | POA: Diagnosis not present

## 2021-11-16 DIAGNOSIS — E1129 Type 2 diabetes mellitus with other diabetic kidney complication: Secondary | ICD-10-CM | POA: Diagnosis not present

## 2021-11-16 DIAGNOSIS — E785 Hyperlipidemia, unspecified: Secondary | ICD-10-CM | POA: Diagnosis not present

## 2021-11-16 DIAGNOSIS — R809 Proteinuria, unspecified: Secondary | ICD-10-CM | POA: Diagnosis not present

## 2021-11-16 DIAGNOSIS — I5022 Chronic systolic (congestive) heart failure: Secondary | ICD-10-CM | POA: Diagnosis not present

## 2021-11-30 DIAGNOSIS — R791 Abnormal coagulation profile: Secondary | ICD-10-CM | POA: Diagnosis not present

## 2021-12-06 ENCOUNTER — Ambulatory Visit (INDEPENDENT_AMBULATORY_CARE_PROVIDER_SITE_OTHER): Payer: Medicare Other

## 2021-12-06 DIAGNOSIS — I495 Sick sinus syndrome: Secondary | ICD-10-CM

## 2021-12-07 DIAGNOSIS — R791 Abnormal coagulation profile: Secondary | ICD-10-CM | POA: Diagnosis not present

## 2021-12-07 LAB — CUP PACEART REMOTE DEVICE CHECK
Battery Remaining Longevity: 103 mo
Battery Remaining Percentage: 95.5 %
Battery Voltage: 3.04 V
Date Time Interrogation Session: 20230531102251
Implantable Lead Implant Date: 20141226
Implantable Lead Implant Date: 20141226
Implantable Lead Location: 753858
Implantable Lead Location: 753860
Implantable Pulse Generator Implant Date: 20230329
Lead Channel Impedance Value: 350 Ohm
Lead Channel Impedance Value: 700 Ohm
Lead Channel Pacing Threshold Amplitude: 0.75 V
Lead Channel Pacing Threshold Amplitude: 1.125 V
Lead Channel Pacing Threshold Pulse Width: 0.4 ms
Lead Channel Pacing Threshold Pulse Width: 0.5 ms
Lead Channel Setting Pacing Amplitude: 2 V
Lead Channel Setting Pacing Amplitude: 2.125
Lead Channel Setting Pacing Pulse Width: 0.4 ms
Lead Channel Setting Pacing Pulse Width: 0.5 ms
Lead Channel Setting Sensing Sensitivity: 4 mV
Pulse Gen Model: 3562
Pulse Gen Serial Number: 8062428

## 2021-12-16 DIAGNOSIS — E785 Hyperlipidemia, unspecified: Secondary | ICD-10-CM | POA: Diagnosis not present

## 2021-12-16 DIAGNOSIS — Z0000 Encounter for general adult medical examination without abnormal findings: Secondary | ICD-10-CM | POA: Diagnosis not present

## 2021-12-16 DIAGNOSIS — Z9181 History of falling: Secondary | ICD-10-CM | POA: Diagnosis not present

## 2021-12-16 DIAGNOSIS — Z139 Encounter for screening, unspecified: Secondary | ICD-10-CM | POA: Diagnosis not present

## 2021-12-16 DIAGNOSIS — Z1331 Encounter for screening for depression: Secondary | ICD-10-CM | POA: Diagnosis not present

## 2021-12-21 DIAGNOSIS — R791 Abnormal coagulation profile: Secondary | ICD-10-CM | POA: Diagnosis not present

## 2021-12-22 NOTE — Progress Notes (Signed)
Remote pacemaker transmission.   

## 2022-01-06 DIAGNOSIS — R051 Acute cough: Secondary | ICD-10-CM | POA: Diagnosis not present

## 2022-01-06 DIAGNOSIS — J988 Other specified respiratory disorders: Secondary | ICD-10-CM | POA: Diagnosis not present

## 2022-01-18 DIAGNOSIS — Z7901 Long term (current) use of anticoagulants: Secondary | ICD-10-CM | POA: Diagnosis not present

## 2022-01-19 DIAGNOSIS — Z7984 Long term (current) use of oral hypoglycemic drugs: Secondary | ICD-10-CM | POA: Diagnosis not present

## 2022-01-19 DIAGNOSIS — L603 Nail dystrophy: Secondary | ICD-10-CM | POA: Diagnosis not present

## 2022-01-19 DIAGNOSIS — E119 Type 2 diabetes mellitus without complications: Secondary | ICD-10-CM | POA: Diagnosis not present

## 2022-01-22 DIAGNOSIS — L603 Nail dystrophy: Secondary | ICD-10-CM | POA: Insufficient documentation

## 2022-01-23 ENCOUNTER — Encounter: Payer: Medicare Other | Admitting: Cardiology

## 2022-01-25 DIAGNOSIS — R791 Abnormal coagulation profile: Secondary | ICD-10-CM | POA: Diagnosis not present

## 2022-02-08 DIAGNOSIS — R791 Abnormal coagulation profile: Secondary | ICD-10-CM | POA: Diagnosis not present

## 2022-02-14 ENCOUNTER — Encounter: Payer: Self-pay | Admitting: Cardiology

## 2022-02-14 ENCOUNTER — Ambulatory Visit (INDEPENDENT_AMBULATORY_CARE_PROVIDER_SITE_OTHER): Payer: Medicare Other | Admitting: Cardiology

## 2022-02-14 VITALS — BP 126/84 | HR 70 | Ht 70.5 in | Wt 146.8 lb

## 2022-02-14 DIAGNOSIS — I5042 Chronic combined systolic (congestive) and diastolic (congestive) heart failure: Secondary | ICD-10-CM | POA: Diagnosis not present

## 2022-02-14 DIAGNOSIS — Z950 Presence of cardiac pacemaker: Secondary | ICD-10-CM | POA: Diagnosis not present

## 2022-02-14 DIAGNOSIS — I4821 Permanent atrial fibrillation: Secondary | ICD-10-CM

## 2022-02-14 NOTE — Progress Notes (Signed)
Electrophysiology Office Note   Date:  02/14/2022   ID:  [name] [name], DOB July 21, 1926, MRN [id number]  PCP:  Nicoletta Dress, MD  Cardiologist:  Agustin Cree Primary Electrophysiologist:  Baudelia Schroepfer Meredith Leeds, MD    Chief Complaint: pacemaker   History of Present Illness: [name] [name] is a 88 y.o. male who is being seen today for the evaluation of pacemaker at the request of Nicoletta Dress, MD. Presenting today for electrophysiology evaluation.  He has a history significant hypertension, sick sinus syndrome status post [facility] Jude CRT-P, atrial fibrillation, nonischemic cardiomyopathy, hypertension, CKD stage III, severe aortic stenosis status post TAVR 04/09/2018.  He was diagnosed with atrial fibrillation in 1995.  He had been on warfarin.  He is post AV node ablation.  He is status post multiple generator changes, most recently 10/05/2021.  Today, denies symptoms of palpitations, chest pain, shortness of breath, orthopnea, PND, lower extremity edema, claudication, dizziness, presyncope, syncope, bleeding, or neurologic sequela. The patient is tolerating medications without difficulties.     Past Medical History:  Diagnosis Date   Acute on chronic combined systolic and diastolic CHF (congestive heart failure) ([place]) 06/29/2013   Atrial fibrillation (HCC)    echo- EF 35-40%; LA severely dilated; RA mod dilated, RV systolic pressure [id number]; LV systolic fcn mod reduced   Biventricular cardiac pacemaker upgrade 07/05/13 (St Jude) 06/29/2013   Cancer ([place])    skin cancer   Cardiomyopathy (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   29 mm Edwards Sapien 3 transcatheter heart valve placed via percutaneous right transfemoral approach    Severe aortic stenosis    SSS (sick sinus syndrome) ([place])    pacemaker placed in 1993   Tremors of nervous system    Past Surgical History:  Procedure Laterality Date   AV NODE ABLATION Bilateral 07/04/2013   Procedure: AV NODE ABLATION;  Surgeon: Evans Lance, MD;  Location: [facility] CATH LAB;  Service: Cardiovascular;  Laterality: Bilateral;   BI-VENTRICULAR PACEMAKER INSERTION Bilateral 07/04/2013   Procedure: BI-VENTRICULAR PACEMAKER INSERTION (CRT-P);  Surgeon: Evans Lance, MD;  Location: [facility] CATH LAB;  Service: Cardiovascular;  Laterality: Bilateral;   BIV PACEMAKER GENERATOR CHANGE OUT  11/2004   BIV PACEMAKER GENERATOR CHANGEOUT N/A 10/05/2021   Procedure: BIVI PACEMAKER GENERATOR CHANGEOUT;  Surgeon: Constance Haw, MD;  Location: [place] CV LAB;  Service: Cardiovascular;  Laterality: N/A;   COLONOSCOPY     HERNIA REPAIR     left groin   INSERT / REPLACE / REMOVE PACEMAKER     INTRAOPERATIVE TRANSTHORACIC ECHOCARDIOGRAM N/A 04/09/2018   Procedure: INTRAOPERATIVE TRANSTHORACIC ECHOCARDIOGRAM;  Surgeon: Burnell Blanks, MD;  Location: [place];  Service: Open Heart Surgery;  Laterality: N/A;   LEFT HEART CATHETERIZATION WITH CORONARY ANGIOGRAM N/A 07/07/2013   Procedure: LEFT HEART CATHETERIZATION WITH CORONARY ANGIOGRAM;  Surgeon: Pixie Casino, MD;  Location: [facility] CATH LAB;  Service: Cardiovascular;  Laterality: N/A;   PACEMAKER INSERTION  1993   RIGHT/LEFT HEART CATH AND CORONARY ANGIOGRAPHY N/A 03/06/2018   Procedure: RIGHT/LEFT HEART CATH AND CORONARY ANGIOGRAPHY;  Surgeon: Burnell Blanks, MD;  Location: [place] CV LAB;  Service: Cardiovascular;  Laterality: N/A;   TONSILLECTOMY     adenoidectomy   TRANSCATHETER AORTIC VALVE REPLACEMENT, TRANSFEMORAL  04/09/2018   TRANSCATHETER AORTIC  VALVE REPLACEMENT, TRANSFEMORAL Right 04/09/2018   Procedure: TRANSCATHETER AORTIC VALVE REPLACEMENT, TRANSFEMORAL. 51m Edwards Sapien 3 THV.;  Surgeon: MBurnell Blanks MD;  Location: [place]  Service: Open Heart Surgery;  Laterality: Right;     Current Outpatient Medications  Medication Sig Dispense Refill   acetaminophen (TYLENOL) 325 MG tablet Take 1-2 tablets (325-650 mg total) by mouth every 4 (four) hours as needed for mild pain.     budesonide-formoterol (SYMBICORT) 160-4.5 MCG/ACT inhaler Inhale 2 puffs into the lungs daily as needed (shortness of breath).     carbidopa-levodopa (SINEMET IR) 25-100 MG per tablet Take 1 tablet by mouth 3 (three) times daily.     furosemide (LASIX) 40 MG tablet Take 40 mg by mouth daily.     lisinopril (ZESTRIL) 5 MG tablet Take 1 tablet by mouth once daily 30 tablet 0   loratadine (CLARITIN) 10 MG tablet Take 10 mg by mouth daily.     metoprolol succinate (TOPROL-XL) 25 MG 24 hr tablet Take 25 mg by mouth daily. Take with or immediately following a meal.     Multiple Vitamins-Minerals (PRESERVISION AREDS 2) CAPS Take 2 capsules by mouth daily. Unknown strength     mupirocin ointment (BACTROBAN) 2 % Apply 1 application. topically daily as needed (infection eye).     Omega-3 Fatty Acids (FISH OIL) 1000 MG CAPS Take 2,000 mg by mouth daily.     sitaGLIPtin (JANUVIA) 100 MG tablet Take 100 mg by mouth daily.     triamcinolone cream (KENALOG) 0.1 % Apply 1 application. topically daily as needed (rash).     vitamin E 400 UNIT capsule Take 400 Units by mouth daily.     warfarin (COUMADIN) 2.5 MG tablet Take 2.5-5 mg by mouth See admin instructions. Take 5 mg Sun, Wed, Friday    Take 2.5 mg  on Monday Tuesday Thursday  and Saturday at betime     No current facility-administered medications for this visit.    Allergies:   Amiodarone   Social History:  The patient  reports that he quit smoking about 52 years ago. His smoking use included cigarettes.  He has never used smokeless tobacco. He reports that he does not drink alcohol and does not use drugs.   Family History:  The patient's family history includes Asthma in his father; Coronary artery disease in his mother; Stroke in his mother.   ROS:  Please see the history of present illness.   Otherwise, review of systems is positive for none.   All other systems are reviewed and negative.   PHYSICAL EXAM: VS:  BP 126/84   Pulse 70   Ht 5' 10.5" (1.791 m)   Wt 146 lb 12.8 oz (66.6 kg)   SpO2 98%   BMI 20.77 kg/m  , BMI Body mass index is 20.77 kg/m. GEN: Well nourished, well developed, in no acute distress  HEENT: normal  Neck: no JVD, carotid bruits, or masses Cardiac: RRR; no murmurs, rubs, or gallops,no edema  Respiratory:  clear to auscultation bilaterally, normal work of breathing GI: soft, nontender, nondistended, + BS MS: no deformity or atrophy  Skin: warm and dry, device site well healed Neuro:  Strength and sensation are intact Psych: euthymic mood, full affect  EKG:  EKG is  ordered today. Personal review of the ekg ordered shows AF, V paced  Personal review of the device interrogation today. Results in [place]: 09/29/2021: BUN 28; Creatinine, Ser 1.86; Hemoglobin 14.2; Platelets 142; Potassium 4.2; Sodium 146    Lipid Panel  No results found for: "CHOL", "TRIG", "HDL", "CHOLHDL", "VLDL", "LDLCALC", "LDLDIRECT"   Wt Readings from Last 3 Encounters:  02/14/22 146 lb 12.8 oz (66.6 kg)  10/05/21 165 lb (74.8 kg)  09/14/21 140 lb 12.8 oz (63.9 kg)      Other studies Reviewed: Additional studies/ records that were reviewed today include: TTE 03/28/21 Review of the above records today demonstrates:   1. Left ventricular ejection fraction, by estimation, is 50 to 55%. The  left ventricle has low normal function. The left ventricle has no regional  wall motion abnormalities. There is moderate left ventricular hypertrophy.  Left ventricular  diastolic  parameters are indeterminate.   2. Right ventricular systolic function is normal. The right ventricular  size is normal. There is normal pulmonary artery systolic pressure.   3. Left atrial size was severely dilated.   4. Right atrial size was moderately dilated.   5. The mitral valve is normal in structure. Moderate mitral valve  regurgitation. No evidence of mitral stenosis.   6. Patient is s/p TAVR with satisfactory valve function. Aortic valve  regurgitation is not visualized. No aortic stenosis is present.   7. The inferior vena cava is normal in size with greater than 50%  respiratory variability, suggesting right atrial pressure of 3 mmHg.    ASSESSMENT AND PLAN:  1.  Permanent atrial fibrillation: Currently on warfarin.  CHA2DS2-VASc of 4.  Is status post Medtr[employer] CRT-P with generator change 10/05/2021.  Device functioning appropriately.  No changes at this time.  2.  Hypertension:well controlled  3.  Chronic combined systolic and diastolic heart failure: Currently on lisinopril 5 mg twice daily, Toprol-XL 25 mg daily.  Plan per primary cardiology.  4.  Severe aortic stenosis: Status post TAVR.  Valve functioning well on most recent echo.  Plan per primary cardiology  5.  Secondary hypercoagulable state: Currently on warfarin for atrial fibrillation as above   Current medicines are reviewed at length with the patient today.   The patient does not have concerns regarding his medicines.  The following changes were made today: none  Labs/ tests ordered today include:  Orders Placed This Encounter  Procedures   EKG 12-Lead      Disposition:   FU with Pete Schnitzer 1 year  Signed, Curvin Hunger Meredith Leeds, MD  02/14/2022 3:58 PM     [place] [address] [place] [address] [phone] (office) [phone] (fax)

## 2022-02-14 NOTE — Patient Instructions (Addendum)
Medication Instructions:  Your physician recommends that you continue on your current medications as directed. Please refer to the Current Medication list given to you today.  Labwork: None ordered.  Testing/Procedures: None ordered.  Follow-Up: Your physician wants you to follow-up in: one year with Dr. Curt Bears in [place]. You will receive a reminder letter in the mail two months in advance. If you don't receive a letter, please call our office to schedule the follow-up appointment.   Remote monitoring is used to monitor your Pacemaker from home. This monitoring reduces the number of office visits required to check your device to one time per year. It allows [place] to keep an eye on the functioning of your device to ensure it is working properly. You are scheduled for a device check from home on 03/07/2022. You may send your transmission at any time that day. If you have a wireless device, the transmission will be sent automatically. After your physician reviews your transmission, you will receive a postcard with your next transmission date.  Any Other Special Instructions Will Be Listed Below (If Applicable).  If you need a refill on your cardiac medications before your next appointment, please call your pharmacy.   Important Information About [place]

## 2022-02-22 DIAGNOSIS — R791 Abnormal coagulation profile: Secondary | ICD-10-CM | POA: Diagnosis not present

## 2022-03-07 ENCOUNTER — Ambulatory Visit (INDEPENDENT_AMBULATORY_CARE_PROVIDER_SITE_OTHER): Payer: Medicare Other

## 2022-03-07 DIAGNOSIS — I4821 Permanent atrial fibrillation: Secondary | ICD-10-CM

## 2022-03-07 LAB — CUP PACEART REMOTE DEVICE CHECK
Battery Remaining Longevity: 101 mo
Battery Remaining Percentage: 95.5 %
Battery Voltage: 3.01 V
Date Time Interrogation Session: 20230829101723
Implantable Lead Implant Date: 20141226
Implantable Lead Implant Date: 20141226
Implantable Lead Location: 753858
Implantable Lead Location: 753860
Implantable Pulse Generator Implant Date: 20230329
Lead Channel Impedance Value: 380 Ohm
Lead Channel Impedance Value: 650 Ohm
Lead Channel Pacing Threshold Amplitude: 0.75 V
Lead Channel Pacing Threshold Amplitude: 1 V
Lead Channel Pacing Threshold Pulse Width: 0.4 ms
Lead Channel Pacing Threshold Pulse Width: 0.5 ms
Lead Channel Sensing Intrinsic Amplitude: 12 mV
Lead Channel Setting Pacing Amplitude: 2 V
Lead Channel Setting Pacing Amplitude: 2 V
Lead Channel Setting Pacing Pulse Width: 0.4 ms
Lead Channel Setting Pacing Pulse Width: 0.5 ms
Lead Channel Setting Sensing Sensitivity: 4 mV
Pulse Gen Model: 3562
Pulse Gen Serial Number: 8062428

## 2022-03-09 DIAGNOSIS — Z7901 Long term (current) use of anticoagulants: Secondary | ICD-10-CM | POA: Diagnosis not present

## 2022-03-22 ENCOUNTER — Encounter: Payer: Self-pay | Admitting: Cardiology

## 2022-03-22 ENCOUNTER — Ambulatory Visit: Payer: Medicare Other | Attending: Cardiology | Admitting: Cardiology

## 2022-03-22 VITALS — BP 108/62 | HR 71 | Ht 70.5 in | Wt 142.2 lb

## 2022-03-22 DIAGNOSIS — R0609 Other forms of dyspnea: Secondary | ICD-10-CM

## 2022-03-22 DIAGNOSIS — I10 Essential (primary) hypertension: Secondary | ICD-10-CM | POA: Insufficient documentation

## 2022-03-22 DIAGNOSIS — I5043 Acute on chronic combined systolic (congestive) and diastolic (congestive) heart failure: Secondary | ICD-10-CM | POA: Diagnosis not present

## 2022-03-22 DIAGNOSIS — I4821 Permanent atrial fibrillation: Secondary | ICD-10-CM | POA: Diagnosis not present

## 2022-03-22 DIAGNOSIS — Z950 Presence of cardiac pacemaker: Secondary | ICD-10-CM | POA: Diagnosis not present

## 2022-03-22 DIAGNOSIS — Z952 Presence of prosthetic heart valve: Secondary | ICD-10-CM | POA: Diagnosis not present

## 2022-03-22 NOTE — Patient Instructions (Signed)
Medication Instructions:  Your physician recommends that you continue on your current medications as directed. Please refer to the Current Medication list given to you today.  *If you need a refill on your cardiac medications before your next appointment, please call your pharmacy*   Lab Work: None If you have labs (blood work) drawn today and your tests are completely normal, you will receive your results only by: [place] (if you have MyChart) OR A paper copy in the mail If you have any lab test that is abnormal or we need to change your treatment, we will call you to review the results.   Testing/Procedures: Your physician has requested that you have an echocardiogram. Echocardiography is a painless test that uses sound waves to create images of your heart. It provides your doctor with information about the size and shape of your heart and how well your heart's chambers and valves are working. This procedure takes approximately one hour. There are no restrictions for this procedure.    Follow-Up: At [facility], you and your health needs are our priority.  As part of our continuing mission to provide you with exceptional heart care, we have created designated Provider Care Teams.  These Care Teams include your primary Cardiologist (physician) and Advanced Practice Providers (APPs -  Physician Assistants and Nurse Practitioners) who all work together to provide you with the care you need, when you need it.  We recommend signing up for the patient portal called "MyChart".  Sign up information is provided on this After Visit Summary.  MyChart is used to connect with patients for Virtual Visits (Telemedicine).  Patients are able to view lab/test results, encounter notes, upcoming appointments, etc.  Non-urgent messages can be sent to your provider as well.   To learn more about what you can do with MyChart, go to [email or web address].    Your next appointment:   6  month(s)  The format for your next appointment:   In Person  Provider:   Jenne Campus, MD    Other Instructions   Important Information About Sugar

## 2022-03-22 NOTE — Progress Notes (Signed)
Cardiology Office Note:    Date:  03/22/2022   ID:  [name] [name], DOB [birth year], MRN [id number]  PCP:  Nicoletta Dress, MD  Cardiologist:  Jenne Campus, MD    Referring MD: Nicoletta Dress, MD   Chief Complaint  Patient presents with   Follow-up    History of Present Illness:    [name] [name] is a 88 y.o. male with past medical history significant for sick sinus syndrome, permanent atrial fibrillation, status post CRT-P, St. 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 He is in my office today for follow-up.  Overall he is not doing well he came to my office with his daughter-in-law and his daughter-in-law suspect that he does have some depression he lives alone he has been visiting by his family quite often 3-4 times a day make sure everything is fine however according to them he lost his appetite he lost interest in the things that he enjoyed before he basically sits around and does not want to do much.  Denies have any chest pain no dizziness he does not talk much during the visit in the office.  Past Medical History:  Diagnosis Date   Acute on chronic combined systolic and diastolic CHF (congestive heart failure) ([place]) 06/29/2013   Atrial fibrillation (HCC)    echo- EF 35-40%; LA severely dilated; RA mod dilated, RV systolic pressure [id number]; LV systolic fcn mod reduced   Biventricular cardiac pacemaker upgrade 07/05/13 (St Jude) 06/29/2013   Cancer ([place])    skin cancer   Cardiomyopathy ([place]) 07/07/2013   Non Obstructive CAD by Cath    CHF (congestive heart failure) (HCC)    Critical aortic valve stenosis 06/29/2013   Current use of long term anticoagulation 06/29/2013    Diabetes mellitus without complication ([place])    Hypertension    Parkinson's disease ([place]) 07/07/2013   Permanent atrial fibrillation- AVN RFA 07/05/13 06/29/2013   Presence of permanent cardiac pacemaker    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   29 mm Edwards Sapien 3 transcatheter heart valve placed via percutaneous right transfemoral approach    Severe aortic stenosis    SSS (sick sinus syndrome) ([place])    pacemaker placed in 1993   Tremors of nervous system     Past Surgical History:  Procedure Laterality Date   AV NODE ABLATION Bilateral 07/04/2013   Procedure: AV NODE ABLATION;  Surgeon: Evans Lance, MD;  Location: [facility] CATH LAB;  Service: Cardiovascular;  Laterality: Bilateral;   BI-VENTRICULAR PACEMAKER INSERTION Bilateral 07/04/2013   Procedure: BI-VENTRICULAR PACEMAKER INSERTION (CRT-P);  Surgeon: Evans Lance, MD;  Location: [facility] CATH LAB;  Service: Cardiovascular;  Laterality: Bilateral;   BIV PACEMAKER GENERATOR CHANGE OUT  11/2004   BIV PACEMAKER GENERATOR CHANGEOUT N/A 10/05/2021   Procedure: BIVI PACEMAKER GENERATOR CHANGEOUT;  Surgeon: Constance Haw, MD;  Location: [place] CV LAB;  Service: Cardiovascular;  Laterality: N/A;   COLONOSCOPY     HERNIA REPAIR     left groin   INSERT / REPLACE / REMOVE PACEMAKER     INTRAOPERATIVE TRANSTHORACIC ECHOCARDIOGRAM N/A 04/09/2018   Procedure: INTRAOPERATIVE TRANSTHORACIC ECHOCARDIOGRAM;  Surgeon: Burnell Blanks, MD;  Location: [place];  Service: Open Heart Surgery;  Laterality: N/A;   LEFT HEART CATHETERIZATION WITH CORONARY ANGIOGRAM N/A 07/07/2013   Procedure: LEFT HEART CATHETERIZATION WITH CORONARY ANGIOGRAM;  Surgeon: Pixie Casino, MD;  Location: [facility] CATH LAB;  Service: Cardiovascular;  Laterality: N/A;   PACEMAKER INSERTION  1993   RIGHT/LEFT HEART CATH AND CORONARY ANGIOGRAPHY N/A 03/06/2018   Procedure: RIGHT/LEFT HEART CATH AND CORONARY ANGIOGRAPHY;  Surgeon: Burnell Blanks, MD;  Location: [place] CV LAB;  Service: Cardiovascular;  Laterality: N/A;   TONSILLECTOMY     adenoidectomy   TRANSCATHETER AORTIC VALVE REPLACEMENT, TRANSFEMORAL  04/09/2018   TRANSCATHETER AORTIC VALVE REPLACEMENT, TRANSFEMORAL Right 04/09/2018   Procedure: TRANSCATHETER AORTIC VALVE REPLACEMENT, TRANSFEMORAL. 74m Edwards Sapien 3 THV.;  Surgeon: MBurnell Blanks MD;  Location: [facility]  Service: Open Heart Surgery;  Laterality: Right;    Current Medications: Current Meds  Medication Sig   acetaminophen (TYLENOL) 325 MG tablet Take 1-2 tablets (325-650 mg total) by mouth every 4 (four) hours as needed for mild pain.   budesonide-formoterol (SYMBICORT) 160-4.5 MCG/ACT inhaler Inhale 2 puffs into the lungs daily as needed (shortness of breath).   carbidopa-levodopa (SINEMET IR) 25-100 MG per tablet Take 1 tablet by mouth 3 (three) times daily.   furosemide (LASIX) 40 MG tablet Take 40 mg by mouth daily.   lisinopril (ZESTRIL) 5 MG tablet Take 1 tablet by mouth once daily (Patient taking differently: Take 5 mg by mouth daily.)   loratadine (CLARITIN) 10 MG tablet Take 10 mg by mouth daily.   metoprolol succinate (TOPROL-XL) 25 MG 24 hr tablet Take 25 mg by mouth daily. Take with or immediately following a meal.   Multiple Vitamins-Minerals (PRESERVISION AREDS 2) CAPS Take 2 capsules by mouth daily. Unknown strength   mupirocin ointment (BACTROBAN) 2 % Apply 1 application. topically daily as needed (infection eye).   Omega-3 Fatty Acids (FISH OIL) 1000 MG CAPS Take 2,000 mg by mouth daily.   sitaGLIPtin (JANUVIA) 100 MG tablet Take 100 mg by mouth daily.   triamcinolone cream (KENALOG) 0.1 % Apply 1 application. topically daily as needed (rash).   vitamin E 400 UNIT capsule Take 400 Units by mouth daily.   warfarin (COUMADIN) 2.5 MG tablet Take 2.5-5 mg by mouth See admin instructions. Take 5 mg Sun, Wed, Friday    Take 2.5 mg  on Monday Tuesday Thursday  and Saturday at betime      Allergies:   Amiodarone   Social History   Socioeconomic History   Marital status: Widowed    Spouse name: Not on file   Number of children: 2   Years of education: 142  Highest education level: Not on file  Occupational History   Occupation: retired  Tobacco Use   Smoking status: Former    Types: Cigarettes    Quit date: 07/09/1969    Years since quitting: 52.7   Smokeless tobacco: Never  Vaping Use   Vaping Use: Never used  Substance and Sexual Activity   Alcohol use: No   Drug use: No   Sexual activity: Not on file  Other Topics Concern   Not on file  Social History Narrative   Widower.  Retired pHydrographic surveyor   Social Determinants of Health   Financial Resource Strain: Not on file  Food Insecurity: Not on file  Transportation Needs: Not on file  Physical Activity: Not on file  Stress: Not on file  Social Connections: Not on file     Family History: The patient's family history  includes Asthma in his father; Coronary artery disease in his mother; Stroke in his mother. ROS:   Please see the history of present illness.    All 14 point review of systems negative except as described per history of present illness  EKGs/Labs/Other Studies Reviewed:      Recent Labs: 09/29/2021: BUN 28; Creatinine, Ser 1.86; Hemoglobin 14.2; Platelets 142; Potassium 4.2; Sodium 146  Recent Lipid Panel No results found for: "CHOL", "TRIG", "HDL", "CHOLHDL", "VLDL", "LDLCALC", "LDLDIRECT"  Physical Exam:    VS:  BP 108/62 (BP Location: Left Arm, Patient Position: Sitting)   Pulse 71   Ht 5' 10.5" (1.791 m)   Wt 142 lb 3.2 oz (64.5 kg)   SpO2 92%   BMI 20.12 kg/m     Wt Readings from Last 3 Encounters:  03/22/22 142 lb 3.2 oz (64.5 kg)  02/14/22 146 lb 12.8 oz (66.6 kg)  10/05/21 165 lb (74.8 kg)     GEN:  Well nourished, well developed in no acute distress HEENT: Normal NECK: No JVD; No carotid bruits LYMPHATICS: No lymphadenopathy CARDIAC: RRR, no murmurs, no  rubs, no gallops RESPIRATORY:  Clear to auscultation without rales, wheezing or rhonchi  ABDOMEN: Soft, non-tender, non-distended MUSCULOSKELETAL:  No edema; No deformity  SKIN: Warm and dry LOWER EXTREMITIES: no swelling NEUROLOGIC:  Alert and oriented x 3 PSYCHIATRIC:  Normal affect   ASSESSMENT:    1. Permanent atrial fibrillation ([place])   2. Dyspnea on exertion   3. S/P TAVR (transcatheter aortic valve replacement)   4. Presence of permanent cardiac pacemaker   5. Acute on chronic combined systolic and diastolic CHF (congestive heart failure) ([place])   6. Primary hypertension   7. Biventricular cardiac pacemaker upgrade 07/05/13 (St Jude)    PLAN:    In order of problems listed above:  Permanent atrial fibrillation.  He is anticoagulated with Coumadin for a long time already.  We will continue that management. Dyspnea on exertion.  I will schedule him to have an echocardiogram done to assess left ventricle ejection fraction. Status post TAVI.  Echocardiogram will be performed to check the function of the valve. Permanent pacemaker present.  Recent replacement device.  Normal function doing well from that point review wound healed completely.  I did review interrogation of the device normal function.   Medication Adjustments/Labs and Tests Ordered: Current medicines are reviewed at length with the patient today.  Concerns regarding medicines are outlined above.  Orders Placed This Encounter  Procedures   ECHOCARDIOGRAM COMPLETE   Medication changes: No orders of the defined types were placed in this encounter.   Signed, Park Liter, MD, [facility] 03/22/2022 12:04 PM    [place]

## 2022-03-23 DIAGNOSIS — R29898 Other symptoms and signs involving the musculoskeletal system: Secondary | ICD-10-CM | POA: Diagnosis not present

## 2022-03-23 DIAGNOSIS — R791 Abnormal coagulation profile: Secondary | ICD-10-CM | POA: Diagnosis not present

## 2022-03-23 DIAGNOSIS — I482 Chronic atrial fibrillation, unspecified: Secondary | ICD-10-CM | POA: Diagnosis not present

## 2022-03-23 DIAGNOSIS — I5022 Chronic systolic (congestive) heart failure: Secondary | ICD-10-CM | POA: Diagnosis not present

## 2022-03-23 DIAGNOSIS — F321 Major depressive disorder, single episode, moderate: Secondary | ICD-10-CM | POA: Diagnosis not present

## 2022-03-23 DIAGNOSIS — R630 Anorexia: Secondary | ICD-10-CM | POA: Diagnosis not present

## 2022-03-31 NOTE — Progress Notes (Signed)
Remote pacemaker transmission.   

## 2022-04-05 ENCOUNTER — Ambulatory Visit: Payer: Medicare Other | Attending: Cardiology

## 2022-04-05 DIAGNOSIS — R0609 Other forms of dyspnea: Secondary | ICD-10-CM | POA: Diagnosis not present

## 2022-04-05 LAB — ECHOCARDIOGRAM COMPLETE
AR max vel: 1.26 cm2
AV Area VTI: 1.3 cm2
AV Area mean vel: 1.26 cm2
AV Mean grad: 5 mmHg
AV Peak grad: 7.8 mmHg
Ao pk vel: 1.4 m/s
Area-P 1/2: 3.03 cm2
MV M vel: 4.69 m/s
MV Peak grad: 88 mmHg
MV VTI: 1.15 cm2
Radius: 0.3 cm
S' Lateral: 3.2 cm

## 2022-04-06 DIAGNOSIS — R791 Abnormal coagulation profile: Secondary | ICD-10-CM | POA: Diagnosis not present

## 2022-04-07 ENCOUNTER — Telehealth: Payer: Self-pay

## 2022-04-07 DIAGNOSIS — R791 Abnormal coagulation profile: Secondary | ICD-10-CM | POA: Diagnosis not present

## 2022-04-07 NOTE — Telephone Encounter (Signed)
My chart message sent regarding normal test results.

## 2022-04-10 DIAGNOSIS — Z7901 Long term (current) use of anticoagulants: Secondary | ICD-10-CM | POA: Diagnosis not present

## 2022-04-10 DIAGNOSIS — I952 Hypotension due to drugs: Secondary | ICD-10-CM | POA: Diagnosis not present

## 2022-04-10 DIAGNOSIS — Z9181 History of falling: Secondary | ICD-10-CM | POA: Diagnosis not present

## 2022-04-10 DIAGNOSIS — Z85828 Personal history of other malignant neoplasm of skin: Secondary | ICD-10-CM | POA: Diagnosis not present

## 2022-04-10 DIAGNOSIS — I428 Other cardiomyopathies: Secondary | ICD-10-CM | POA: Diagnosis not present

## 2022-04-10 DIAGNOSIS — N183 Chronic kidney disease, stage 3 unspecified: Secondary | ICD-10-CM | POA: Diagnosis not present

## 2022-04-10 DIAGNOSIS — F321 Major depressive disorder, single episode, moderate: Secondary | ICD-10-CM | POA: Diagnosis not present

## 2022-04-10 DIAGNOSIS — I5023 Acute on chronic systolic (congestive) heart failure: Secondary | ICD-10-CM | POA: Diagnosis not present

## 2022-04-10 DIAGNOSIS — Z952 Presence of prosthetic heart valve: Secondary | ICD-10-CM | POA: Diagnosis not present

## 2022-04-10 DIAGNOSIS — E1122 Type 2 diabetes mellitus with diabetic chronic kidney disease: Secondary | ICD-10-CM | POA: Diagnosis not present

## 2022-04-10 DIAGNOSIS — R630 Anorexia: Secondary | ICD-10-CM | POA: Diagnosis not present

## 2022-04-10 DIAGNOSIS — Z950 Presence of cardiac pacemaker: Secondary | ICD-10-CM | POA: Diagnosis not present

## 2022-04-10 DIAGNOSIS — G250 Essential tremor: Secondary | ICD-10-CM | POA: Diagnosis not present

## 2022-04-10 DIAGNOSIS — Z5181 Encounter for therapeutic drug level monitoring: Secondary | ICD-10-CM | POA: Diagnosis not present

## 2022-04-10 DIAGNOSIS — Z87891 Personal history of nicotine dependence: Secondary | ICD-10-CM | POA: Diagnosis not present

## 2022-04-10 DIAGNOSIS — I130 Hypertensive heart and chronic kidney disease with heart failure and stage 1 through stage 4 chronic kidney disease, or unspecified chronic kidney disease: Secondary | ICD-10-CM | POA: Diagnosis not present

## 2022-04-10 DIAGNOSIS — I482 Chronic atrial fibrillation, unspecified: Secondary | ICD-10-CM | POA: Diagnosis not present

## 2022-04-10 DIAGNOSIS — N529 Male erectile dysfunction, unspecified: Secondary | ICD-10-CM | POA: Diagnosis not present

## 2022-04-10 DIAGNOSIS — Z7984 Long term (current) use of oral hypoglycemic drugs: Secondary | ICD-10-CM | POA: Diagnosis not present

## 2022-04-11 DIAGNOSIS — F321 Major depressive disorder, single episode, moderate: Secondary | ICD-10-CM | POA: Diagnosis not present

## 2022-04-11 DIAGNOSIS — N183 Chronic kidney disease, stage 3 unspecified: Secondary | ICD-10-CM | POA: Diagnosis not present

## 2022-04-11 DIAGNOSIS — I130 Hypertensive heart and chronic kidney disease with heart failure and stage 1 through stage 4 chronic kidney disease, or unspecified chronic kidney disease: Secondary | ICD-10-CM | POA: Diagnosis not present

## 2022-04-11 DIAGNOSIS — I5023 Acute on chronic systolic (congestive) heart failure: Secondary | ICD-10-CM | POA: Diagnosis not present

## 2022-04-11 DIAGNOSIS — I482 Chronic atrial fibrillation, unspecified: Secondary | ICD-10-CM | POA: Diagnosis not present

## 2022-04-11 DIAGNOSIS — R791 Abnormal coagulation profile: Secondary | ICD-10-CM | POA: Diagnosis not present

## 2022-04-11 DIAGNOSIS — E1122 Type 2 diabetes mellitus with diabetic chronic kidney disease: Secondary | ICD-10-CM | POA: Diagnosis not present

## 2022-04-12 DIAGNOSIS — I5023 Acute on chronic systolic (congestive) heart failure: Secondary | ICD-10-CM | POA: Diagnosis not present

## 2022-04-12 DIAGNOSIS — N183 Chronic kidney disease, stage 3 unspecified: Secondary | ICD-10-CM | POA: Diagnosis not present

## 2022-04-12 DIAGNOSIS — I482 Chronic atrial fibrillation, unspecified: Secondary | ICD-10-CM | POA: Diagnosis not present

## 2022-04-12 DIAGNOSIS — I130 Hypertensive heart and chronic kidney disease with heart failure and stage 1 through stage 4 chronic kidney disease, or unspecified chronic kidney disease: Secondary | ICD-10-CM | POA: Diagnosis not present

## 2022-04-12 DIAGNOSIS — F321 Major depressive disorder, single episode, moderate: Secondary | ICD-10-CM | POA: Diagnosis not present

## 2022-04-12 DIAGNOSIS — E1122 Type 2 diabetes mellitus with diabetic chronic kidney disease: Secondary | ICD-10-CM | POA: Diagnosis not present

## 2022-04-13 DIAGNOSIS — N183 Chronic kidney disease, stage 3 unspecified: Secondary | ICD-10-CM | POA: Diagnosis not present

## 2022-04-13 DIAGNOSIS — I482 Chronic atrial fibrillation, unspecified: Secondary | ICD-10-CM | POA: Diagnosis not present

## 2022-04-13 DIAGNOSIS — E1122 Type 2 diabetes mellitus with diabetic chronic kidney disease: Secondary | ICD-10-CM | POA: Diagnosis not present

## 2022-04-13 DIAGNOSIS — I5023 Acute on chronic systolic (congestive) heart failure: Secondary | ICD-10-CM | POA: Diagnosis not present

## 2022-04-13 DIAGNOSIS — F321 Major depressive disorder, single episode, moderate: Secondary | ICD-10-CM | POA: Diagnosis not present

## 2022-04-13 DIAGNOSIS — I130 Hypertensive heart and chronic kidney disease with heart failure and stage 1 through stage 4 chronic kidney disease, or unspecified chronic kidney disease: Secondary | ICD-10-CM | POA: Diagnosis not present

## 2022-04-17 DIAGNOSIS — N183 Chronic kidney disease, stage 3 unspecified: Secondary | ICD-10-CM | POA: Diagnosis not present

## 2022-04-17 DIAGNOSIS — I5023 Acute on chronic systolic (congestive) heart failure: Secondary | ICD-10-CM | POA: Diagnosis not present

## 2022-04-17 DIAGNOSIS — E1122 Type 2 diabetes mellitus with diabetic chronic kidney disease: Secondary | ICD-10-CM | POA: Diagnosis not present

## 2022-04-17 DIAGNOSIS — I130 Hypertensive heart and chronic kidney disease with heart failure and stage 1 through stage 4 chronic kidney disease, or unspecified chronic kidney disease: Secondary | ICD-10-CM | POA: Diagnosis not present

## 2022-04-17 DIAGNOSIS — I482 Chronic atrial fibrillation, unspecified: Secondary | ICD-10-CM | POA: Diagnosis not present

## 2022-04-17 DIAGNOSIS — F321 Major depressive disorder, single episode, moderate: Secondary | ICD-10-CM | POA: Diagnosis not present

## 2022-04-18 DIAGNOSIS — F321 Major depressive disorder, single episode, moderate: Secondary | ICD-10-CM | POA: Diagnosis not present

## 2022-04-18 DIAGNOSIS — I5023 Acute on chronic systolic (congestive) heart failure: Secondary | ICD-10-CM | POA: Diagnosis not present

## 2022-04-18 DIAGNOSIS — E1122 Type 2 diabetes mellitus with diabetic chronic kidney disease: Secondary | ICD-10-CM | POA: Diagnosis not present

## 2022-04-18 DIAGNOSIS — I482 Chronic atrial fibrillation, unspecified: Secondary | ICD-10-CM | POA: Diagnosis not present

## 2022-04-18 DIAGNOSIS — N183 Chronic kidney disease, stage 3 unspecified: Secondary | ICD-10-CM | POA: Diagnosis not present

## 2022-04-18 DIAGNOSIS — I130 Hypertensive heart and chronic kidney disease with heart failure and stage 1 through stage 4 chronic kidney disease, or unspecified chronic kidney disease: Secondary | ICD-10-CM | POA: Diagnosis not present

## 2022-04-18 DIAGNOSIS — R630 Anorexia: Secondary | ICD-10-CM | POA: Diagnosis not present

## 2022-04-18 DIAGNOSIS — R791 Abnormal coagulation profile: Secondary | ICD-10-CM | POA: Diagnosis not present

## 2022-04-18 DIAGNOSIS — I5022 Chronic systolic (congestive) heart failure: Secondary | ICD-10-CM | POA: Diagnosis not present

## 2022-04-19 DIAGNOSIS — I5023 Acute on chronic systolic (congestive) heart failure: Secondary | ICD-10-CM | POA: Diagnosis not present

## 2022-04-19 DIAGNOSIS — E119 Type 2 diabetes mellitus without complications: Secondary | ICD-10-CM | POA: Diagnosis not present

## 2022-04-19 DIAGNOSIS — I130 Hypertensive heart and chronic kidney disease with heart failure and stage 1 through stage 4 chronic kidney disease, or unspecified chronic kidney disease: Secondary | ICD-10-CM | POA: Diagnosis not present

## 2022-04-19 DIAGNOSIS — F321 Major depressive disorder, single episode, moderate: Secondary | ICD-10-CM | POA: Diagnosis not present

## 2022-04-19 DIAGNOSIS — N183 Chronic kidney disease, stage 3 unspecified: Secondary | ICD-10-CM | POA: Diagnosis not present

## 2022-04-19 DIAGNOSIS — I482 Chronic atrial fibrillation, unspecified: Secondary | ICD-10-CM | POA: Diagnosis not present

## 2022-04-19 DIAGNOSIS — E1122 Type 2 diabetes mellitus with diabetic chronic kidney disease: Secondary | ICD-10-CM | POA: Diagnosis not present

## 2022-04-20 DIAGNOSIS — I130 Hypertensive heart and chronic kidney disease with heart failure and stage 1 through stage 4 chronic kidney disease, or unspecified chronic kidney disease: Secondary | ICD-10-CM | POA: Diagnosis not present

## 2022-04-20 DIAGNOSIS — E1122 Type 2 diabetes mellitus with diabetic chronic kidney disease: Secondary | ICD-10-CM | POA: Diagnosis not present

## 2022-04-20 DIAGNOSIS — F321 Major depressive disorder, single episode, moderate: Secondary | ICD-10-CM | POA: Diagnosis not present

## 2022-04-20 DIAGNOSIS — I5023 Acute on chronic systolic (congestive) heart failure: Secondary | ICD-10-CM | POA: Diagnosis not present

## 2022-04-20 DIAGNOSIS — N183 Chronic kidney disease, stage 3 unspecified: Secondary | ICD-10-CM | POA: Diagnosis not present

## 2022-04-20 DIAGNOSIS — I482 Chronic atrial fibrillation, unspecified: Secondary | ICD-10-CM | POA: Diagnosis not present

## 2022-04-24 DIAGNOSIS — N183 Chronic kidney disease, stage 3 unspecified: Secondary | ICD-10-CM | POA: Diagnosis not present

## 2022-04-24 DIAGNOSIS — F321 Major depressive disorder, single episode, moderate: Secondary | ICD-10-CM | POA: Diagnosis not present

## 2022-04-24 DIAGNOSIS — I482 Chronic atrial fibrillation, unspecified: Secondary | ICD-10-CM | POA: Diagnosis not present

## 2022-04-24 DIAGNOSIS — E1122 Type 2 diabetes mellitus with diabetic chronic kidney disease: Secondary | ICD-10-CM | POA: Diagnosis not present

## 2022-04-24 DIAGNOSIS — I5023 Acute on chronic systolic (congestive) heart failure: Secondary | ICD-10-CM | POA: Diagnosis not present

## 2022-04-24 DIAGNOSIS — I130 Hypertensive heart and chronic kidney disease with heart failure and stage 1 through stage 4 chronic kidney disease, or unspecified chronic kidney disease: Secondary | ICD-10-CM | POA: Diagnosis not present

## 2022-04-25 DIAGNOSIS — R791 Abnormal coagulation profile: Secondary | ICD-10-CM | POA: Diagnosis not present

## 2022-04-26 DIAGNOSIS — N183 Chronic kidney disease, stage 3 unspecified: Secondary | ICD-10-CM | POA: Diagnosis not present

## 2022-04-26 DIAGNOSIS — I5023 Acute on chronic systolic (congestive) heart failure: Secondary | ICD-10-CM | POA: Diagnosis not present

## 2022-04-26 DIAGNOSIS — E1122 Type 2 diabetes mellitus with diabetic chronic kidney disease: Secondary | ICD-10-CM | POA: Diagnosis not present

## 2022-04-26 DIAGNOSIS — I482 Chronic atrial fibrillation, unspecified: Secondary | ICD-10-CM | POA: Diagnosis not present

## 2022-04-26 DIAGNOSIS — F321 Major depressive disorder, single episode, moderate: Secondary | ICD-10-CM | POA: Diagnosis not present

## 2022-04-26 DIAGNOSIS — I130 Hypertensive heart and chronic kidney disease with heart failure and stage 1 through stage 4 chronic kidney disease, or unspecified chronic kidney disease: Secondary | ICD-10-CM | POA: Diagnosis not present

## 2022-04-27 DIAGNOSIS — I5023 Acute on chronic systolic (congestive) heart failure: Secondary | ICD-10-CM | POA: Diagnosis not present

## 2022-04-27 DIAGNOSIS — I482 Chronic atrial fibrillation, unspecified: Secondary | ICD-10-CM | POA: Diagnosis not present

## 2022-04-27 DIAGNOSIS — N183 Chronic kidney disease, stage 3 unspecified: Secondary | ICD-10-CM | POA: Diagnosis not present

## 2022-04-27 DIAGNOSIS — E1122 Type 2 diabetes mellitus with diabetic chronic kidney disease: Secondary | ICD-10-CM | POA: Diagnosis not present

## 2022-04-27 DIAGNOSIS — I130 Hypertensive heart and chronic kidney disease with heart failure and stage 1 through stage 4 chronic kidney disease, or unspecified chronic kidney disease: Secondary | ICD-10-CM | POA: Diagnosis not present

## 2022-04-27 DIAGNOSIS — F321 Major depressive disorder, single episode, moderate: Secondary | ICD-10-CM | POA: Diagnosis not present

## 2022-05-02 DIAGNOSIS — E1122 Type 2 diabetes mellitus with diabetic chronic kidney disease: Secondary | ICD-10-CM | POA: Diagnosis not present

## 2022-05-02 DIAGNOSIS — I5023 Acute on chronic systolic (congestive) heart failure: Secondary | ICD-10-CM | POA: Diagnosis not present

## 2022-05-02 DIAGNOSIS — I482 Chronic atrial fibrillation, unspecified: Secondary | ICD-10-CM | POA: Diagnosis not present

## 2022-05-02 DIAGNOSIS — I130 Hypertensive heart and chronic kidney disease with heart failure and stage 1 through stage 4 chronic kidney disease, or unspecified chronic kidney disease: Secondary | ICD-10-CM | POA: Diagnosis not present

## 2022-05-02 DIAGNOSIS — N183 Chronic kidney disease, stage 3 unspecified: Secondary | ICD-10-CM | POA: Diagnosis not present

## 2022-05-02 DIAGNOSIS — R791 Abnormal coagulation profile: Secondary | ICD-10-CM | POA: Diagnosis not present

## 2022-05-02 DIAGNOSIS — F321 Major depressive disorder, single episode, moderate: Secondary | ICD-10-CM | POA: Diagnosis not present

## 2022-05-04 DIAGNOSIS — F321 Major depressive disorder, single episode, moderate: Secondary | ICD-10-CM | POA: Diagnosis not present

## 2022-05-04 DIAGNOSIS — E1122 Type 2 diabetes mellitus with diabetic chronic kidney disease: Secondary | ICD-10-CM | POA: Diagnosis not present

## 2022-05-04 DIAGNOSIS — I130 Hypertensive heart and chronic kidney disease with heart failure and stage 1 through stage 4 chronic kidney disease, or unspecified chronic kidney disease: Secondary | ICD-10-CM | POA: Diagnosis not present

## 2022-05-04 DIAGNOSIS — I482 Chronic atrial fibrillation, unspecified: Secondary | ICD-10-CM | POA: Diagnosis not present

## 2022-05-04 DIAGNOSIS — I5023 Acute on chronic systolic (congestive) heart failure: Secondary | ICD-10-CM | POA: Diagnosis not present

## 2022-05-04 DIAGNOSIS — N183 Chronic kidney disease, stage 3 unspecified: Secondary | ICD-10-CM | POA: Diagnosis not present

## 2022-05-05 DIAGNOSIS — N183 Chronic kidney disease, stage 3 unspecified: Secondary | ICD-10-CM | POA: Diagnosis not present

## 2022-05-05 DIAGNOSIS — I482 Chronic atrial fibrillation, unspecified: Secondary | ICD-10-CM | POA: Diagnosis not present

## 2022-05-05 DIAGNOSIS — E1122 Type 2 diabetes mellitus with diabetic chronic kidney disease: Secondary | ICD-10-CM | POA: Diagnosis not present

## 2022-05-05 DIAGNOSIS — I130 Hypertensive heart and chronic kidney disease with heart failure and stage 1 through stage 4 chronic kidney disease, or unspecified chronic kidney disease: Secondary | ICD-10-CM | POA: Diagnosis not present

## 2022-05-05 DIAGNOSIS — I5023 Acute on chronic systolic (congestive) heart failure: Secondary | ICD-10-CM | POA: Diagnosis not present

## 2022-05-05 DIAGNOSIS — F321 Major depressive disorder, single episode, moderate: Secondary | ICD-10-CM | POA: Diagnosis not present

## 2022-05-10 DIAGNOSIS — R630 Anorexia: Secondary | ICD-10-CM | POA: Diagnosis not present

## 2022-05-10 DIAGNOSIS — Z7984 Long term (current) use of oral hypoglycemic drugs: Secondary | ICD-10-CM | POA: Diagnosis not present

## 2022-05-10 DIAGNOSIS — G250 Essential tremor: Secondary | ICD-10-CM | POA: Diagnosis not present

## 2022-05-10 DIAGNOSIS — Z87891 Personal history of nicotine dependence: Secondary | ICD-10-CM | POA: Diagnosis not present

## 2022-05-10 DIAGNOSIS — Z85828 Personal history of other malignant neoplasm of skin: Secondary | ICD-10-CM | POA: Diagnosis not present

## 2022-05-10 DIAGNOSIS — I952 Hypotension due to drugs: Secondary | ICD-10-CM | POA: Diagnosis not present

## 2022-05-10 DIAGNOSIS — N183 Chronic kidney disease, stage 3 unspecified: Secondary | ICD-10-CM | POA: Diagnosis not present

## 2022-05-10 DIAGNOSIS — N529 Male erectile dysfunction, unspecified: Secondary | ICD-10-CM | POA: Diagnosis not present

## 2022-05-10 DIAGNOSIS — I428 Other cardiomyopathies: Secondary | ICD-10-CM | POA: Diagnosis not present

## 2022-05-10 DIAGNOSIS — Z952 Presence of prosthetic heart valve: Secondary | ICD-10-CM | POA: Diagnosis not present

## 2022-05-10 DIAGNOSIS — I482 Chronic atrial fibrillation, unspecified: Secondary | ICD-10-CM | POA: Diagnosis not present

## 2022-05-10 DIAGNOSIS — F321 Major depressive disorder, single episode, moderate: Secondary | ICD-10-CM | POA: Diagnosis not present

## 2022-05-10 DIAGNOSIS — I5023 Acute on chronic systolic (congestive) heart failure: Secondary | ICD-10-CM | POA: Diagnosis not present

## 2022-05-10 DIAGNOSIS — Z7901 Long term (current) use of anticoagulants: Secondary | ICD-10-CM | POA: Diagnosis not present

## 2022-05-10 DIAGNOSIS — E1122 Type 2 diabetes mellitus with diabetic chronic kidney disease: Secondary | ICD-10-CM | POA: Diagnosis not present

## 2022-05-10 DIAGNOSIS — Z9181 History of falling: Secondary | ICD-10-CM | POA: Diagnosis not present

## 2022-05-10 DIAGNOSIS — Z950 Presence of cardiac pacemaker: Secondary | ICD-10-CM | POA: Diagnosis not present

## 2022-05-10 DIAGNOSIS — Z5181 Encounter for therapeutic drug level monitoring: Secondary | ICD-10-CM | POA: Diagnosis not present

## 2022-05-10 DIAGNOSIS — I130 Hypertensive heart and chronic kidney disease with heart failure and stage 1 through stage 4 chronic kidney disease, or unspecified chronic kidney disease: Secondary | ICD-10-CM | POA: Diagnosis not present

## 2022-05-11 DIAGNOSIS — I130 Hypertensive heart and chronic kidney disease with heart failure and stage 1 through stage 4 chronic kidney disease, or unspecified chronic kidney disease: Secondary | ICD-10-CM | POA: Diagnosis not present

## 2022-05-11 DIAGNOSIS — F321 Major depressive disorder, single episode, moderate: Secondary | ICD-10-CM | POA: Diagnosis not present

## 2022-05-11 DIAGNOSIS — I482 Chronic atrial fibrillation, unspecified: Secondary | ICD-10-CM | POA: Diagnosis not present

## 2022-05-11 DIAGNOSIS — I5023 Acute on chronic systolic (congestive) heart failure: Secondary | ICD-10-CM | POA: Diagnosis not present

## 2022-05-11 DIAGNOSIS — E1122 Type 2 diabetes mellitus with diabetic chronic kidney disease: Secondary | ICD-10-CM | POA: Diagnosis not present

## 2022-05-11 DIAGNOSIS — N183 Chronic kidney disease, stage 3 unspecified: Secondary | ICD-10-CM | POA: Diagnosis not present

## 2022-05-12 ENCOUNTER — Telehealth: Payer: Self-pay | Admitting: Cardiology

## 2022-05-12 DIAGNOSIS — E1122 Type 2 diabetes mellitus with diabetic chronic kidney disease: Secondary | ICD-10-CM | POA: Diagnosis not present

## 2022-05-12 DIAGNOSIS — F321 Major depressive disorder, single episode, moderate: Secondary | ICD-10-CM | POA: Diagnosis not present

## 2022-05-12 DIAGNOSIS — I5023 Acute on chronic systolic (congestive) heart failure: Secondary | ICD-10-CM | POA: Diagnosis not present

## 2022-05-12 DIAGNOSIS — I482 Chronic atrial fibrillation, unspecified: Secondary | ICD-10-CM | POA: Diagnosis not present

## 2022-05-12 DIAGNOSIS — N183 Chronic kidney disease, stage 3 unspecified: Secondary | ICD-10-CM | POA: Diagnosis not present

## 2022-05-12 DIAGNOSIS — I130 Hypertensive heart and chronic kidney disease with heart failure and stage 1 through stage 4 chronic kidney disease, or unspecified chronic kidney disease: Secondary | ICD-10-CM | POA: Diagnosis not present

## 2022-05-12 NOTE — Telephone Encounter (Signed)
Patient's daughter in law [name] [name] is here because she is POA and is trying to get [name] [name] into assisted living but they are worried about how often he has to come for Coumadin checks. Daughter in law states that they were told in the past  there was an alternative that the biggest plus side was not having to have the checks all the time. They would now like to venture into that option if possible. Please call [phone] for more information and to advise.  Thank you

## 2022-05-15 DIAGNOSIS — I482 Chronic atrial fibrillation, unspecified: Secondary | ICD-10-CM | POA: Diagnosis not present

## 2022-05-15 DIAGNOSIS — F321 Major depressive disorder, single episode, moderate: Secondary | ICD-10-CM | POA: Diagnosis not present

## 2022-05-15 DIAGNOSIS — I130 Hypertensive heart and chronic kidney disease with heart failure and stage 1 through stage 4 chronic kidney disease, or unspecified chronic kidney disease: Secondary | ICD-10-CM | POA: Diagnosis not present

## 2022-05-15 DIAGNOSIS — E1122 Type 2 diabetes mellitus with diabetic chronic kidney disease: Secondary | ICD-10-CM | POA: Diagnosis not present

## 2022-05-15 DIAGNOSIS — I5023 Acute on chronic systolic (congestive) heart failure: Secondary | ICD-10-CM | POA: Diagnosis not present

## 2022-05-15 DIAGNOSIS — N183 Chronic kidney disease, stage 3 unspecified: Secondary | ICD-10-CM | POA: Diagnosis not present

## 2022-05-15 NOTE — Telephone Encounter (Signed)
Advised that Dr. Delena Bali orders and manages his coumadin they need to discuss medication changes with hi, [name] verbalized understanding and had no additional questions.

## 2022-05-15 NOTE — Telephone Encounter (Signed)
Patient's wife is calling back to follow up on wanting to know as soon as possible if patient can be switched to alternative med. She states the living facility has an opening, but they are unable to accept the patient if he is in need of INR checks due to not having a nurse on staff who can draw it. They are needing an answer on the patient taking the room by the end of the week. Please advise.

## 2022-05-16 DIAGNOSIS — I482 Chronic atrial fibrillation, unspecified: Secondary | ICD-10-CM | POA: Diagnosis not present

## 2022-05-16 DIAGNOSIS — R791 Abnormal coagulation profile: Secondary | ICD-10-CM | POA: Diagnosis not present

## 2022-05-19 DIAGNOSIS — I482 Chronic atrial fibrillation, unspecified: Secondary | ICD-10-CM | POA: Diagnosis not present

## 2022-05-19 DIAGNOSIS — F321 Major depressive disorder, single episode, moderate: Secondary | ICD-10-CM | POA: Diagnosis not present

## 2022-05-19 DIAGNOSIS — I5023 Acute on chronic systolic (congestive) heart failure: Secondary | ICD-10-CM | POA: Diagnosis not present

## 2022-05-19 DIAGNOSIS — E1122 Type 2 diabetes mellitus with diabetic chronic kidney disease: Secondary | ICD-10-CM | POA: Diagnosis not present

## 2022-05-19 DIAGNOSIS — N183 Chronic kidney disease, stage 3 unspecified: Secondary | ICD-10-CM | POA: Diagnosis not present

## 2022-05-19 DIAGNOSIS — I130 Hypertensive heart and chronic kidney disease with heart failure and stage 1 through stage 4 chronic kidney disease, or unspecified chronic kidney disease: Secondary | ICD-10-CM | POA: Diagnosis not present

## 2022-05-22 DIAGNOSIS — R630 Anorexia: Secondary | ICD-10-CM | POA: Diagnosis not present

## 2022-05-22 DIAGNOSIS — E785 Hyperlipidemia, unspecified: Secondary | ICD-10-CM | POA: Diagnosis not present

## 2022-05-22 DIAGNOSIS — R809 Proteinuria, unspecified: Secondary | ICD-10-CM | POA: Diagnosis not present

## 2022-05-22 DIAGNOSIS — I482 Chronic atrial fibrillation, unspecified: Secondary | ICD-10-CM | POA: Diagnosis not present

## 2022-05-22 DIAGNOSIS — H6123 Impacted cerumen, bilateral: Secondary | ICD-10-CM | POA: Diagnosis not present

## 2022-05-22 DIAGNOSIS — E1129 Type 2 diabetes mellitus with other diabetic kidney complication: Secondary | ICD-10-CM | POA: Diagnosis not present

## 2022-05-22 DIAGNOSIS — I5022 Chronic systolic (congestive) heart failure: Secondary | ICD-10-CM | POA: Diagnosis not present

## 2022-05-22 DIAGNOSIS — N183 Chronic kidney disease, stage 3 unspecified: Secondary | ICD-10-CM | POA: Diagnosis not present

## 2022-05-22 DIAGNOSIS — L989 Disorder of the skin and subcutaneous tissue, unspecified: Secondary | ICD-10-CM | POA: Diagnosis not present

## 2022-05-22 DIAGNOSIS — F321 Major depressive disorder, single episode, moderate: Secondary | ICD-10-CM | POA: Diagnosis not present

## 2022-05-24 DIAGNOSIS — F321 Major depressive disorder, single episode, moderate: Secondary | ICD-10-CM | POA: Diagnosis not present

## 2022-05-24 DIAGNOSIS — E1122 Type 2 diabetes mellitus with diabetic chronic kidney disease: Secondary | ICD-10-CM | POA: Diagnosis not present

## 2022-05-24 DIAGNOSIS — I482 Chronic atrial fibrillation, unspecified: Secondary | ICD-10-CM | POA: Diagnosis not present

## 2022-05-24 DIAGNOSIS — I130 Hypertensive heart and chronic kidney disease with heart failure and stage 1 through stage 4 chronic kidney disease, or unspecified chronic kidney disease: Secondary | ICD-10-CM | POA: Diagnosis not present

## 2022-05-24 DIAGNOSIS — N183 Chronic kidney disease, stage 3 unspecified: Secondary | ICD-10-CM | POA: Diagnosis not present

## 2022-05-24 DIAGNOSIS — I5023 Acute on chronic systolic (congestive) heart failure: Secondary | ICD-10-CM | POA: Diagnosis not present

## 2022-05-25 DIAGNOSIS — I482 Chronic atrial fibrillation, unspecified: Secondary | ICD-10-CM | POA: Diagnosis not present

## 2022-05-25 DIAGNOSIS — I5023 Acute on chronic systolic (congestive) heart failure: Secondary | ICD-10-CM | POA: Diagnosis not present

## 2022-05-25 DIAGNOSIS — F321 Major depressive disorder, single episode, moderate: Secondary | ICD-10-CM | POA: Diagnosis not present

## 2022-05-25 DIAGNOSIS — I130 Hypertensive heart and chronic kidney disease with heart failure and stage 1 through stage 4 chronic kidney disease, or unspecified chronic kidney disease: Secondary | ICD-10-CM | POA: Diagnosis not present

## 2022-05-25 DIAGNOSIS — N183 Chronic kidney disease, stage 3 unspecified: Secondary | ICD-10-CM | POA: Diagnosis not present

## 2022-05-25 DIAGNOSIS — E1122 Type 2 diabetes mellitus with diabetic chronic kidney disease: Secondary | ICD-10-CM | POA: Diagnosis not present

## 2022-05-29 DIAGNOSIS — E1122 Type 2 diabetes mellitus with diabetic chronic kidney disease: Secondary | ICD-10-CM | POA: Diagnosis not present

## 2022-05-29 DIAGNOSIS — I5023 Acute on chronic systolic (congestive) heart failure: Secondary | ICD-10-CM | POA: Diagnosis not present

## 2022-05-29 DIAGNOSIS — F321 Major depressive disorder, single episode, moderate: Secondary | ICD-10-CM | POA: Diagnosis not present

## 2022-05-29 DIAGNOSIS — I130 Hypertensive heart and chronic kidney disease with heart failure and stage 1 through stage 4 chronic kidney disease, or unspecified chronic kidney disease: Secondary | ICD-10-CM | POA: Diagnosis not present

## 2022-05-29 DIAGNOSIS — I482 Chronic atrial fibrillation, unspecified: Secondary | ICD-10-CM | POA: Diagnosis not present

## 2022-05-29 DIAGNOSIS — N183 Chronic kidney disease, stage 3 unspecified: Secondary | ICD-10-CM | POA: Diagnosis not present

## 2022-05-30 DIAGNOSIS — F321 Major depressive disorder, single episode, moderate: Secondary | ICD-10-CM | POA: Diagnosis not present

## 2022-05-30 DIAGNOSIS — E1122 Type 2 diabetes mellitus with diabetic chronic kidney disease: Secondary | ICD-10-CM | POA: Diagnosis not present

## 2022-05-30 DIAGNOSIS — I482 Chronic atrial fibrillation, unspecified: Secondary | ICD-10-CM | POA: Diagnosis not present

## 2022-05-30 DIAGNOSIS — I5023 Acute on chronic systolic (congestive) heart failure: Secondary | ICD-10-CM | POA: Diagnosis not present

## 2022-05-30 DIAGNOSIS — N183 Chronic kidney disease, stage 3 unspecified: Secondary | ICD-10-CM | POA: Diagnosis not present

## 2022-05-30 DIAGNOSIS — I130 Hypertensive heart and chronic kidney disease with heart failure and stage 1 through stage 4 chronic kidney disease, or unspecified chronic kidney disease: Secondary | ICD-10-CM | POA: Diagnosis not present

## 2022-05-31 DIAGNOSIS — E785 Hyperlipidemia, unspecified: Secondary | ICD-10-CM | POA: Diagnosis not present

## 2022-05-31 DIAGNOSIS — I130 Hypertensive heart and chronic kidney disease with heart failure and stage 1 through stage 4 chronic kidney disease, or unspecified chronic kidney disease: Secondary | ICD-10-CM | POA: Diagnosis not present

## 2022-05-31 DIAGNOSIS — F321 Major depressive disorder, single episode, moderate: Secondary | ICD-10-CM | POA: Diagnosis not present

## 2022-05-31 DIAGNOSIS — N183 Chronic kidney disease, stage 3 unspecified: Secondary | ICD-10-CM | POA: Diagnosis not present

## 2022-05-31 DIAGNOSIS — I482 Chronic atrial fibrillation, unspecified: Secondary | ICD-10-CM | POA: Diagnosis not present

## 2022-05-31 DIAGNOSIS — R630 Anorexia: Secondary | ICD-10-CM | POA: Diagnosis not present

## 2022-05-31 DIAGNOSIS — Z23 Encounter for immunization: Secondary | ICD-10-CM | POA: Diagnosis not present

## 2022-05-31 DIAGNOSIS — R809 Proteinuria, unspecified: Secondary | ICD-10-CM | POA: Diagnosis not present

## 2022-05-31 DIAGNOSIS — I5023 Acute on chronic systolic (congestive) heart failure: Secondary | ICD-10-CM | POA: Diagnosis not present

## 2022-05-31 DIAGNOSIS — E1129 Type 2 diabetes mellitus with other diabetic kidney complication: Secondary | ICD-10-CM | POA: Diagnosis not present

## 2022-05-31 DIAGNOSIS — N184 Chronic kidney disease, stage 4 (severe): Secondary | ICD-10-CM | POA: Diagnosis not present

## 2022-05-31 DIAGNOSIS — I5022 Chronic systolic (congestive) heart failure: Secondary | ICD-10-CM | POA: Diagnosis not present

## 2022-05-31 DIAGNOSIS — E1122 Type 2 diabetes mellitus with diabetic chronic kidney disease: Secondary | ICD-10-CM | POA: Diagnosis not present

## 2022-06-05 DIAGNOSIS — I130 Hypertensive heart and chronic kidney disease with heart failure and stage 1 through stage 4 chronic kidney disease, or unspecified chronic kidney disease: Secondary | ICD-10-CM | POA: Diagnosis not present

## 2022-06-05 DIAGNOSIS — I482 Chronic atrial fibrillation, unspecified: Secondary | ICD-10-CM | POA: Diagnosis not present

## 2022-06-05 DIAGNOSIS — E1122 Type 2 diabetes mellitus with diabetic chronic kidney disease: Secondary | ICD-10-CM | POA: Diagnosis not present

## 2022-06-05 DIAGNOSIS — N183 Chronic kidney disease, stage 3 unspecified: Secondary | ICD-10-CM | POA: Diagnosis not present

## 2022-06-05 DIAGNOSIS — F321 Major depressive disorder, single episode, moderate: Secondary | ICD-10-CM | POA: Diagnosis not present

## 2022-06-05 DIAGNOSIS — I5023 Acute on chronic systolic (congestive) heart failure: Secondary | ICD-10-CM | POA: Diagnosis not present

## 2022-06-06 ENCOUNTER — Emergency Department (HOSPITAL_COMMUNITY): Payer: Medicare Other

## 2022-06-06 ENCOUNTER — Encounter (HOSPITAL_COMMUNITY): Payer: Self-pay

## 2022-06-06 ENCOUNTER — Telehealth: Payer: Self-pay

## 2022-06-06 ENCOUNTER — Other Ambulatory Visit: Payer: Self-pay

## 2022-06-06 ENCOUNTER — Inpatient Hospital Stay (HOSPITAL_COMMUNITY)
Admission: EM | Admit: 2022-06-06 | Discharge: 2022-06-09 | DRG: 563 | Disposition: A | Discharge status: Skilled Nursing Facility | Payer: Medicare Other | Attending: General Surgery | Admitting: General Surgery

## 2022-06-06 ENCOUNTER — Ambulatory Visit (INDEPENDENT_AMBULATORY_CARE_PROVIDER_SITE_OTHER): Payer: Medicare Other

## 2022-06-06 DIAGNOSIS — D329 Benign neoplasm of meninges, unspecified: Secondary | ICD-10-CM | POA: Diagnosis present

## 2022-06-06 DIAGNOSIS — Z888 Allergy status to other drugs, medicaments and biological substances status: Secondary | ICD-10-CM

## 2022-06-06 DIAGNOSIS — Z9181 History of falling: Secondary | ICD-10-CM | POA: Diagnosis not present

## 2022-06-06 DIAGNOSIS — I5042 Chronic combined systolic (congestive) and diastolic (congestive) heart failure: Secondary | ICD-10-CM | POA: Diagnosis present

## 2022-06-06 DIAGNOSIS — M810 Age-related osteoporosis without current pathological fracture: Secondary | ICD-10-CM | POA: Diagnosis not present

## 2022-06-06 DIAGNOSIS — R531 Weakness: Secondary | ICD-10-CM | POA: Diagnosis not present

## 2022-06-06 DIAGNOSIS — S299XXA Unspecified injury of thorax, initial encounter: Secondary | ICD-10-CM | POA: Diagnosis not present

## 2022-06-06 DIAGNOSIS — Z87891 Personal history of nicotine dependence: Secondary | ICD-10-CM | POA: Diagnosis not present

## 2022-06-06 DIAGNOSIS — W19XXXA Unspecified fall, initial encounter: Secondary | ICD-10-CM | POA: Diagnosis not present

## 2022-06-06 DIAGNOSIS — I2510 Atherosclerotic heart disease of native coronary artery without angina pectoris: Secondary | ICD-10-CM | POA: Diagnosis present

## 2022-06-06 DIAGNOSIS — Z85828 Personal history of other malignant neoplasm of skin: Secondary | ICD-10-CM

## 2022-06-06 DIAGNOSIS — I10 Essential (primary) hypertension: Secondary | ICD-10-CM | POA: Diagnosis not present

## 2022-06-06 DIAGNOSIS — I6381 Other cerebral infarction due to occlusion or stenosis of small artery: Secondary | ICD-10-CM | POA: Diagnosis not present

## 2022-06-06 DIAGNOSIS — F419 Anxiety disorder, unspecified: Secondary | ICD-10-CM | POA: Diagnosis not present

## 2022-06-06 DIAGNOSIS — I110 Hypertensive heart disease with heart failure: Secondary | ICD-10-CM | POA: Diagnosis present

## 2022-06-06 DIAGNOSIS — I517 Cardiomegaly: Secondary | ICD-10-CM | POA: Diagnosis not present

## 2022-06-06 DIAGNOSIS — Z7401 Bed confinement status: Secondary | ICD-10-CM | POA: Diagnosis not present

## 2022-06-06 DIAGNOSIS — I350 Nonrheumatic aortic (valve) stenosis: Secondary | ICD-10-CM | POA: Diagnosis present

## 2022-06-06 DIAGNOSIS — S2242XA Multiple fractures of ribs, left side, initial encounter for closed fracture: Secondary | ICD-10-CM | POA: Diagnosis not present

## 2022-06-06 DIAGNOSIS — Z7901 Long term (current) use of anticoagulants: Secondary | ICD-10-CM | POA: Diagnosis not present

## 2022-06-06 DIAGNOSIS — I495 Sick sinus syndrome: Secondary | ICD-10-CM | POA: Diagnosis present

## 2022-06-06 DIAGNOSIS — R279 Unspecified lack of coordination: Secondary | ICD-10-CM | POA: Diagnosis not present

## 2022-06-06 DIAGNOSIS — Z7984 Long term (current) use of oral hypoglycemic drugs: Secondary | ICD-10-CM

## 2022-06-06 DIAGNOSIS — I700 Atherosclerosis of aorta: Secondary | ICD-10-CM | POA: Diagnosis not present

## 2022-06-06 DIAGNOSIS — S42002D Fracture of unspecified part of left clavicle, subsequent encounter for fracture with routine healing: Secondary | ICD-10-CM | POA: Diagnosis not present

## 2022-06-06 DIAGNOSIS — G20A1 Parkinson's disease without dyskinesia, without mention of fluctuations: Secondary | ICD-10-CM | POA: Diagnosis present

## 2022-06-06 DIAGNOSIS — S20219A Contusion of unspecified front wall of thorax, initial encounter: Secondary | ICD-10-CM | POA: Diagnosis not present

## 2022-06-06 DIAGNOSIS — Z953 Presence of xenogenic heart valve: Secondary | ICD-10-CM | POA: Diagnosis not present

## 2022-06-06 DIAGNOSIS — S42002A Fracture of unspecified part of left clavicle, initial encounter for closed fracture: Secondary | ICD-10-CM | POA: Diagnosis not present

## 2022-06-06 DIAGNOSIS — S20219D Contusion of unspecified front wall of thorax, subsequent encounter: Secondary | ICD-10-CM | POA: Diagnosis not present

## 2022-06-06 DIAGNOSIS — R296 Repeated falls: Secondary | ICD-10-CM | POA: Diagnosis present

## 2022-06-06 DIAGNOSIS — Z23 Encounter for immunization: Secondary | ICD-10-CM | POA: Diagnosis not present

## 2022-06-06 DIAGNOSIS — Z79899 Other long term (current) drug therapy: Secondary | ICD-10-CM | POA: Diagnosis not present

## 2022-06-06 DIAGNOSIS — S1093XA Contusion of unspecified part of neck, initial encounter: Secondary | ICD-10-CM | POA: Diagnosis not present

## 2022-06-06 DIAGNOSIS — E46 Unspecified protein-calorie malnutrition: Secondary | ICD-10-CM | POA: Diagnosis not present

## 2022-06-06 DIAGNOSIS — I4821 Permanent atrial fibrillation: Secondary | ICD-10-CM | POA: Diagnosis present

## 2022-06-06 DIAGNOSIS — Z952 Presence of prosthetic heart valve: Secondary | ICD-10-CM | POA: Diagnosis not present

## 2022-06-06 DIAGNOSIS — E119 Type 2 diabetes mellitus without complications: Secondary | ICD-10-CM | POA: Diagnosis present

## 2022-06-06 DIAGNOSIS — I5043 Acute on chronic combined systolic (congestive) and diastolic (congestive) heart failure: Secondary | ICD-10-CM | POA: Diagnosis not present

## 2022-06-06 DIAGNOSIS — Z950 Presence of cardiac pacemaker: Secondary | ICD-10-CM | POA: Diagnosis not present

## 2022-06-06 DIAGNOSIS — H1131 Conjunctival hemorrhage, right eye: Secondary | ICD-10-CM | POA: Diagnosis present

## 2022-06-06 DIAGNOSIS — S42012A Anterior displaced fracture of sternal end of left clavicle, initial encounter for closed fracture: Secondary | ICD-10-CM

## 2022-06-06 DIAGNOSIS — M6281 Muscle weakness (generalized): Secondary | ICD-10-CM | POA: Diagnosis not present

## 2022-06-06 DIAGNOSIS — R2681 Unsteadiness on feet: Secondary | ICD-10-CM | POA: Diagnosis not present

## 2022-06-06 DIAGNOSIS — F32A Depression, unspecified: Secondary | ICD-10-CM | POA: Diagnosis not present

## 2022-06-06 LAB — CBC WITH DIFFERENTIAL/PLATELET
Abs Immature Granulocytes: 0.15 10*3/uL — ABNORMAL HIGH (ref 0.00–0.07)
Basophils Absolute: 0.1 10*3/uL (ref 0.0–0.1)
Basophils Relative: 1 %
Eosinophils Absolute: 0.2 10*3/uL (ref 0.0–0.5)
Eosinophils Relative: 2 %
HCT: 42.7 % (ref 39.0–52.0)
Hemoglobin: 13.5 g/dL (ref 13.0–17.0)
Immature Granulocytes: 1 %
Lymphocytes Relative: 12 %
Lymphs Abs: 1.5 10*3/uL (ref 0.7–4.0)
MCH: 30.9 pg (ref 26.0–34.0)
MCHC: 31.6 g/dL (ref 30.0–36.0)
MCV: 97.7 fL (ref 80.0–100.0)
Monocytes Absolute: 0.9 10*3/uL (ref 0.1–1.0)
Monocytes Relative: 7 %
Neutro Abs: 10.1 10*3/uL — ABNORMAL HIGH (ref 1.7–7.7)
Neutrophils Relative %: 77 %
Platelets: 144 10*3/uL — ABNORMAL LOW (ref 150–400)
RBC: 4.37 MIL/uL (ref 4.22–5.81)
RDW: 15.5 % (ref 11.5–15.5)
WBC: 13 10*3/uL — ABNORMAL HIGH (ref 4.0–10.5)
nRBC: 0 % (ref 0.0–0.2)

## 2022-06-06 LAB — TYPE AND SCREEN
ABO/RH(D): A POS
Antibody Screen: NEGATIVE

## 2022-06-06 LAB — I-STAT CHEM 8, ED
BUN: 42 mg/dL — ABNORMAL HIGH (ref 8–23)
Calcium, Ion: 1.12 mmol/L — ABNORMAL LOW (ref 1.15–1.40)
Chloride: 106 mmol/L (ref 98–111)
Creatinine, Ser: 1.9 mg/dL — ABNORMAL HIGH (ref 0.61–1.24)
Glucose, Bld: 137 mg/dL — ABNORMAL HIGH (ref 70–99)
HCT: 42 % (ref 39.0–52.0)
Hemoglobin: 14.3 g/dL (ref 13.0–17.0)
Potassium: 4.9 mmol/L (ref 3.5–5.1)
Sodium: 143 mmol/L (ref 135–145)
TCO2: 29 mmol/L (ref 22–32)

## 2022-06-06 LAB — BASIC METABOLIC PANEL
Anion gap: 12 (ref 5–15)
BUN: 30 mg/dL — ABNORMAL HIGH (ref 8–23)
CO2: 26 mmol/L (ref 22–32)
Calcium: 9 mg/dL (ref 8.9–10.3)
Chloride: 105 mmol/L (ref 98–111)
Creatinine, Ser: 1.99 mg/dL — ABNORMAL HIGH (ref 0.61–1.24)
GFR, Estimated: 31 mL/min — ABNORMAL LOW (ref >60)
Glucose, Bld: 138 mg/dL — ABNORMAL HIGH (ref 70–99)
Potassium: 5 mmol/L (ref 3.5–5.1)
Sodium: 143 mmol/L (ref 135–145)

## 2022-06-06 LAB — PROTIME-INR
INR: 1.4 — ABNORMAL HIGH (ref 0.8–1.2)
Prothrombin Time: 17.2 seconds — ABNORMAL HIGH (ref 11.4–15.2)

## 2022-06-06 MED ORDER — METOPROLOL SUCCINATE ER 25 MG PO TB24
25.0000 mg | ORAL_TABLET | Freq: Every day | ORAL | Status: DC
  Administered 2022-06-09: 25 mg via ORAL
  Filled 2022-06-06 (×3): qty 1

## 2022-06-06 MED ORDER — MUPIROCIN 2 % EX OINT: 1.0000 | TOPICAL_OINTMENT | Freq: Every day | CUTANEOUS | Status: DC | PRN

## 2022-06-06 MED ORDER — ALBUTEROL SULFATE (2.5 MG/3ML) 0.083% IN NEBU: 2.5000 mg | INHALATION_SOLUTION | RESPIRATORY_TRACT | Status: DC | PRN

## 2022-06-06 MED ORDER — SODIUM CHLORIDE 0.9% FLUSH: 3.0000 mL | INTRAVENOUS | Status: DC | PRN

## 2022-06-06 MED ORDER — ALBUTEROL SULFATE HFA 108 (90 BASE) MCG/ACT IN AERS: 1.0000 | INHALATION_SPRAY | RESPIRATORY_TRACT | Status: DC | PRN

## 2022-06-06 MED ORDER — METOPROLOL SUCCINATE ER 25 MG PO TB24: 25.0000 mg | ORAL_TABLET | Freq: Every day | ORAL | Status: DC

## 2022-06-06 MED ORDER — TRAMADOL HCL 50 MG PO TABS: 50.0000 mg | ORAL_TABLET | Freq: Four times a day (QID) | ORAL | Status: DC | PRN

## 2022-06-06 MED ORDER — INSULIN ASPART 100 UNIT/ML IJ SOLN: 0.0000 [IU] | Freq: Three times a day (TID) | INTRAMUSCULAR | Status: DC

## 2022-06-06 MED ORDER — ACETAMINOPHEN 325 MG PO TABS
650.0000 mg | ORAL_TABLET | Freq: Four times a day (QID) | ORAL | Status: DC
  Administered 2022-06-06 – 2022-06-09 (×8): 650 mg via ORAL
  Filled 2022-06-06 (×8): qty 2

## 2022-06-06 MED ORDER — IOHEXOL 350 MG/ML SOLN
50.0000 mL | Freq: Once | INTRAVENOUS | Status: AC | PRN
  Administered 2022-06-06: 50 mL via INTRAVENOUS

## 2022-06-06 MED ORDER — SODIUM CHLORIDE 0.9 % IV SOLN: 250.0000 mL | INTRAVENOUS | Status: DC | PRN

## 2022-06-06 MED ORDER — LISINOPRIL 10 MG PO TABS: 5.0000 mg | ORAL_TABLET | Freq: Every day | ORAL | Status: DC

## 2022-06-06 MED ORDER — ONDANSETRON 4 MG PO TBDP: 4.0000 mg | ORAL_TABLET | Freq: Four times a day (QID) | ORAL | Status: DC | PRN

## 2022-06-06 MED ORDER — ONDANSETRON HCL 4 MG/2ML IJ SOLN
4.0000 mg | Freq: Four times a day (QID) | INTRAMUSCULAR | Status: DC | PRN
  Administered 2022-06-07: 4 mg via INTRAVENOUS
  Filled 2022-06-06: qty 2

## 2022-06-06 MED ORDER — MORPHINE SULFATE (PF) 4 MG/ML IV SOLN: 4.0000 mg | INTRAVENOUS | Status: DC | PRN

## 2022-06-06 MED ORDER — SODIUM CHLORIDE 0.9% FLUSH
3.0000 mL | Freq: Two times a day (BID) | INTRAVENOUS | Status: DC
  Administered 2022-06-06 – 2022-06-09 (×5): 3 mL via INTRAVENOUS

## 2022-06-06 MED ORDER — FUROSEMIDE 20 MG PO TABS
20.0000 mg | ORAL_TABLET | Freq: Every day | ORAL | Status: DC
  Administered 2022-06-06 – 2022-06-09 (×3): 20 mg via ORAL
  Filled 2022-06-06 (×4): qty 1

## 2022-06-06 MED ORDER — METHOCARBAMOL 500 MG PO TABS
500.0000 mg | ORAL_TABLET | Freq: Three times a day (TID) | ORAL | Status: DC
  Administered 2022-06-06 – 2022-06-09 (×8): 500 mg via ORAL
  Filled 2022-06-06 (×8): qty 1

## 2022-06-06 MED ORDER — LISINOPRIL 5 MG PO TABS
5.0000 mg | ORAL_TABLET | Freq: Every day | ORAL | Status: DC
  Administered 2022-06-09: 5 mg via ORAL
  Filled 2022-06-06 (×3): qty 1

## 2022-06-06 MED ORDER — CARBIDOPA-LEVODOPA 25-100 MG PO TABS
1.0000 | ORAL_TABLET | Freq: Three times a day (TID) | ORAL | Status: DC
  Administered 2022-06-06 – 2022-06-09 (×9): 1 via ORAL
  Filled 2022-06-06 (×11): qty 1

## 2022-06-06 NOTE — H&P (Signed)
[name] [name] is an 88 y.o. male.   Chief Complaint: fall HPI: 88yo M with PMHx CHF, pacemaker, afib on coumadin and DM fell at home walking from the living room to the dining room.  No loss of consciousness.  He underwent evaluation in the emergency department was found to have a left clavicle fracture with a large associated hematoma.  I was asked to see him for admission to observe this area.  The emergency department physician reviewed his chest CT with TCTS and there was no vascular injury noted.  Orthopedics has been consulted.  Family is at the bedside and assists with his medical history.  Of note, he has been on eyedrops for irritation of his right eye for the past few weeks.  Past Medical History:  Diagnosis Date   Acute on chronic combined systolic and diastolic CHF (congestive heart failure) ([place]) 06/29/2013   Atrial fibrillation (HCC)    echo- EF 35-40%; LA severely dilated; RA mod dilated, RV systolic pressure [id number]; LV systolic fcn mod reduced   Biventricular cardiac pacemaker upgrade 07/05/13 (St Jude) 06/29/2013   Cancer ([place])    skin cancer   Cardiomyopathy ([place]) 07/07/2013   Non Obstructive CAD by Cath    CHF (congestive heart failure) (HCC)    Critical aortic valve stenosis 06/29/2013   Current use of long term anticoagulation 06/29/2013   Diabetes mellitus without complication ([place])    Hypertension    Parkinson's disease 07/07/2013   Permanent atrial fibrillation- AVN RFA 07/05/13 06/29/2013   Presence of permanent cardiac pacemaker    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   29 mm Edwards Sapien 3 transcatheter heart valve placed via percutaneous right transfemoral approach    Severe aortic stenosis    SSS (sick sinus syndrome) ([place])    pacemaker placed in 1993   Tremors of nervous system     Past Surgical History:  Procedure Laterality Date   AV NODE ABLATION Bilateral 07/04/2013   Procedure: AV NODE ABLATION;  Surgeon: Evans Lance,  MD;  Location: [facility] CATH LAB;  Service: Cardiovascular;  Laterality: Bilateral;   BI-VENTRICULAR PACEMAKER INSERTION Bilateral 07/04/2013   Procedure: BI-VENTRICULAR PACEMAKER INSERTION (CRT-P);  Surgeon: Evans Lance, MD;  Location: [facility] CATH LAB;  Service: Cardiovascular;  Laterality: Bilateral;   BIV PACEMAKER GENERATOR CHANGE OUT  11/2004   BIV PACEMAKER GENERATOR CHANGEOUT N/A 10/05/2021   Procedure: BIVI PACEMAKER GENERATOR CHANGEOUT;  Surgeon: Constance Haw, MD;  Location: [place] CV LAB;  Service: Cardiovascular;  Laterality: N/A;   COLONOSCOPY     HERNIA REPAIR     left groin   INSERT / REPLACE / REMOVE PACEMAKER     INTRAOPERATIVE TRANSTHORACIC ECHOCARDIOGRAM N/A 04/09/2018   Procedure: INTRAOPERATIVE TRANSTHORACIC ECHOCARDIOGRAM;  Surgeon: Burnell Blanks, MD;  Location: [place];  Service: Open Heart Surgery;  Laterality: N/A;   LEFT HEART CATHETERIZATION WITH CORONARY ANGIOGRAM N/A 07/07/2013   Procedure: LEFT HEART CATHETERIZATION WITH CORONARY ANGIOGRAM;  Surgeon: Pixie Casino, MD;  Location: [facility] CATH LAB;  Service: Cardiovascular;  Laterality: N/A;   PACEMAKER INSERTION  1993   RIGHT/LEFT HEART CATH AND CORONARY ANGIOGRAPHY N/A 03/06/2018   Procedure: RIGHT/LEFT HEART CATH AND CORONARY ANGIOGRAPHY;  Surgeon: Burnell Blanks, MD;  Location: [place] CV LAB;  Service: Cardiovascular;  Laterality: N/A;   TONSILLECTOMY     adenoidectomy   TRANSCATHETER AORTIC VALVE REPLACEMENT, TRANSFEMORAL  04/09/2018   TRANSCATHETER AORTIC VALVE REPLACEMENT, TRANSFEMORAL Right 04/09/2018   Procedure: TRANSCATHETER  AORTIC VALVE REPLACEMENT, TRANSFEMORAL. 49m Edwards Sapien 3 THV.;  Surgeon: MBurnell Blanks MD;  [place] [facility]  Service: Open Heart Surgery;  Laterality: Right;    Family History  Problem Relation Age of Onset   Asthma Father    Stroke Mother    Coronary artery disease Mother    Social History:  reports that he quit smoking about 52 years ago.  His smoking use included cigarettes. He has never used smokeless tobacco. He reports that he does not drink alcohol and does not use drugs.  Allergies:  Allergies  Allergen Reactions   Amiodarone Other (See Comments)    Amiodarone induced hepatitis    (Not in a hospital admission)   Results for orders placed or performed during the hospital encounter of 06/06/22 (from the past 48 hour(s))  CBC with Differential     Status: Abnormal   Collection Time: 06/06/22  5:04 PM  Result Value Ref Range   WBC 13.0 (H) 4.0 - 10.5 K/uL   RBC 4.37 4.22 - 5.81 MIL/uL   Hemoglobin 13.5 13.0 - 17.0 g/dL   HCT 42.7 39.0 - 52.0 %   MCV 97.7 80.0 - 100.0 fL   MCH 30.9 26.0 - 34.0 pg   MCHC 31.6 30.0 - 36.0 g/dL   RDW 15.5 11.5 - 15.5 %   Platelets 144 (L) 150 - 400 K/uL   nRBC 0.0 0.0 - 0.2 %   Neutrophils Relative % 77 %   Neutro Abs 10.1 (H) 1.7 - 7.7 K/uL   Lymphocytes Relative 12 %   Lymphs Abs 1.5 0.7 - 4.0 K/uL   Monocytes Relative 7 %   Monocytes Absolute 0.9 0.1 - 1.0 K/uL   Eosinophils Relative 2 %   Eosinophils Absolute 0.2 0.0 - 0.5 K/uL   Basophils Relative 1 %   Basophils Absolute 0.1 0.0 - 0.1 K/uL   Immature Granulocytes 1 %   Abs Immature Granulocytes 0.15 (H) 0.00 - 0.07 K/uL    Comment: Performed at [facility] [address] [place] [address] Protime-INR     Status: Abnormal   Collection Time: 06/06/22  5:04 PM  Result Value Ref Range   Prothrombin Time 17.2 (H) 11.4 - 15.2 seconds   INR 1.4 (H) 0.8 - 1.2    Comment: (NOTE) INR goal varies based on device and disease states. Performed at [facility] [address] [place] [address]  Type and screen [place]    Status: None   Collection Time: 06/06/22  5:04 PM  Result Value Ref Range   ABO/RH(D) A POS    Antibody Screen NEG    Sample Expiration      06/09/2022,2359 Performed at [facility] [address], [place] [address]  Basic  metabolic panel     Status: Abnormal   Collection Time: 06/06/22  5:04 PM  Result Value Ref Range   Sodium 143 135 - 145 mmol/L   Potassium 5.0 3.5 - 5.1 mmol/L    Comment: HEMOLYSIS AT THIS LEVEL MAY AFFECT RESULT   Chloride 105 98 - 111 mmol/L   CO2 26 22 - 32 mmol/L   Glucose, Bld 138 (H) 70 - 99 mg/dL    Comment: Glucose reference range applies only to samples taken after fasting for at least 8 hours.   BUN 30 (H) 8 - 23 mg/dL   Creatinine, Ser 1.99 (H) 0.61 - 1.24  mg/dL   Calcium 9.0 8.9 - 10.3 mg/dL   GFR, Estimated 31 (L) >60 mL/min    Comment: (NOTE) Calculated using the CKD-EPI Creatinine Equation (2021)    Anion gap 12 5 - 15    Comment: Performed at [facility], [place] [address] [place], [place] [address]  I-stat chem 8, ED     Status: Abnormal   Collection Time: 06/06/22  5:13 PM  Result Value Ref Range   Sodium 143 135 - 145 mmol/L   Potassium 4.9 3.5 - 5.1 mmol/L   Chloride 106 98 - 111 mmol/L   BUN 42 (H) 8 - 23 mg/dL   Creatinine, Ser 1.90 (H) 0.61 - 1.24 mg/dL   Glucose, Bld 137 (H) 70 - 99 mg/dL    Comment: Glucose reference range applies only to samples taken after fasting for at least 8 hours.   Calcium, Ion 1.12 (L) 1.15 - 1.40 mmol/L   TCO2 29 22 - 32 mmol/L   Hemoglobin 14.3 13.0 - 17.0 g/dL   HCT 42.0 39.0 - 52.0 %   CT Head Wo Contrast  Result Date: 06/06/2022 CLINICAL DATA:  Trauma fall on Eliquis EXAM: CT HEAD WITHOUT CONTRAST CT CERVICAL SPINE WITHOUT CONTRAST TECHNIQUE: Multidetector CT imaging of the head and cervical spine was performed following the standard protocol without intravenous contrast. Multiplanar CT image reconstructions of the cervical spine were also generated. RADIATION DOSE REDUCTION: This exam was performed according to the departmental dose-optimization program which includes automated exposure control, adjustment of the mA and/or kV according to patient size and/or use of iterative reconstruction technique. COMPARISON:   None Available. FINDINGS: CT HEAD FINDINGS Brain: No acute territorial infarction or hemorrhage is visualized. 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 Mild mass effect on adjacent temporal lobe. No edema. No midline shift. Moderate atrophy. Patchy white matter hypodensity consistent with chronic small vessel ischemic changes. Multiple chronic appearing lacunar infarcts within the right thalamus and bilateral basal ganglia. Nonenlarged ventricles Vascular: No hyperdense vessels. Vertebral and carotid vascular calcification Skull: Normal. Negative for fracture or focal lesion. Sinuses/Orbits: No acute finding. Other: None CT CERVICAL SPINE FINDINGS Alignment: Straightening of the cervical spine. No subluxation. Facet alignment is within normal limits Skull base and vertebrae: No acute fracture. No primary bone lesion or focal pathologic process. Soft tissues and spinal canal: No prevertebral fluid or swelling. No visible canal hematoma. Disc levels: Multilevel degenerative change. Mild disc space narrowing C2 through C5 with moderate severe disc space narrowing C5 through C7. Facet degenerative changes at multiple levels. Upper chest: Lung apices are clear. Partially visualized hematoma at the left base of neck and involving the strap muscles. Incompletely visualized displaced fracture involving the medial left clavicle. Other: None IMPRESSION: 1. No CT evidence for acute intracranial abnormality. Atrophy and chronic small vessel ischemic changes of the white matter. 2. 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 3. Straightening of the cervical spine with degenerative changes. No acute osseous abnormality. 4. Partially visualized displaced fracture involving the medial head of the left clavicle. Partially visualized hematoma at the  left base of neck and involving the strap muscles. Electronically Signed   By: Donavan Foil M.D.   On: 06/06/2022 18:01   CT Cervical Spine Wo Contrast  Result Date: 06/06/2022 CLINICAL DATA:  Trauma fall on Eliquis EXAM: CT HEAD WITHOUT CONTRAST  CT CERVICAL SPINE WITHOUT CONTRAST TECHNIQUE: Multidetector CT imaging of the head and cervical spine was performed following the standard protocol without intravenous contrast. Multiplanar CT image reconstructions of the cervical spine were also generated. RADIATION DOSE REDUCTION: This exam was performed according to the departmental dose-optimization program which includes automated exposure control, adjustment of the mA and/or kV according to patient size and/or use of iterative reconstruction technique. COMPARISON:  None Available. FINDINGS: CT HEAD FINDINGS Brain: No acute territorial infarction or hemorrhage is visualized. 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 Mild mass effect on adjacent temporal lobe. No edema. No midline shift. Moderate atrophy. Patchy white matter hypodensity consistent with chronic small vessel ischemic changes. Multiple chronic appearing lacunar infarcts within the right thalamus and bilateral basal ganglia. Nonenlarged ventricles Vascular: No hyperdense vessels. Vertebral and carotid vascular calcification Skull: Normal. Negative for fracture or focal lesion. Sinuses/Orbits: No acute finding. Other: None CT CERVICAL SPINE FINDINGS Alignment: Straightening of the cervical spine. No subluxation. Facet alignment is within normal limits Skull base and vertebrae: No acute fracture. No primary bone lesion or focal pathologic process. Soft tissues and spinal canal: No prevertebral fluid or swelling. No visible canal hematoma. Disc levels: Multilevel degenerative change. Mild disc space narrowing C2 through C5 with moderate severe disc space  narrowing C5 through C7. Facet degenerative changes at multiple levels. Upper chest: Lung apices are clear. Partially visualized hematoma at the left base of neck and involving the strap muscles. Incompletely visualized displaced fracture involving the medial left clavicle. Other: None IMPRESSION: 1. No CT evidence for acute intracranial abnormality. Atrophy and chronic small vessel ischemic changes of the white matter. 2. 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 3. Straightening of the cervical spine with degenerative changes. No acute osseous abnormality. 4. Partially visualized displaced fracture involving the medial head of the left clavicle. Partially visualized hematoma at the left base of neck and involving the strap muscles. Electronically Signed   By: Donavan Foil M.D.   On: 06/06/2022 18:01   CT Chest W Contrast  Result Date: 06/06/2022 CLINICAL DATA:  Chest trauma after fall; on Eliquis with hematoma over the proximal left clavicle EXAM: CT CHEST WITH CONTRAST TECHNIQUE: Multidetector CT imaging of the chest was performed during intravenous contrast administration. RADIATION DOSE REDUCTION: This exam was performed according to the departmental dose-optimization program which includes automated exposure control, adjustment of the mA and/or kV according to patient size and/or use of iterative reconstruction technique. CONTRAST:  28m OMNIPAQUE IOHEXOL 350 MG/ML SOLN COMPARISON:  Radiographs earlier today and CTA chest 03/21/2018 FINDINGS: Cardiovascular: Left chest wall CRT-P. TAVR. Coronary artery and aortic athero sclerotic calcification. No pericardial effusion. Mild cardiac enlargement. Mediastinum/Nodes: Unchanged 2.4 cm left thyroid nodule. This is stable from 2019 and should be benign. No follow-up recommended. No thoracic adenopathy by size. Unremarkable esophagus. Lungs/Pleura: No focal consolidation,  pleural effusion, or pneumothorax. Mild centrilobular micro nodularity in the right lower lobe and lingula with mild bronchiolectasis. Upper Abdomen: Low-attenuation lesions in the kidneys are statistically likely to represent cysts. No follow-up is required. 5.6 cm cyst in the pancreatic tail unchanged from 2019. No acute abnormality. Musculoskeletal: Acute displaced fracture of the proximal left clavicle. The distal fragment is displaced anteriorly proximally 9 mm. Associated ill-defined hyperdense collection within the pectoralis muscles compatible with hematoma. No definite active bleeding though evaluation is limited  by streak artifact from adjacent pacemaker. Healed posttraumatic deformity right clavicle. IMPRESSION: 1. Acute displaced fracture of the proximal left clavicle with adjacent intramuscular hematoma. No definite active bleeding though evaluation is limited by streak artifact from adjacent pacemaker. 2. Mild small airway infection/inflammation. 3. Coronary artery and Aortic Atherosclerosis (ICD10-I70.0). Electronically Signed   By: Placido Sou M.D.   On: 06/06/2022 17:58   DG Chest Port 1 View  Result Date: 06/06/2022 CLINICAL DATA:  Fall on blood thinners EXAM: PORTABLE CHEST 1 VIEW COMPARISON:  Radiographs 04/09/2018 FINDINGS: Stable cardiomegaly. Aortic atherosclerotic calcification. TAVR. Left chest wall CRT-P. No focal consolidation, pleural effusion, or pneumothorax. No acute osseous abnormality. Remote left rib fractures. IMPRESSION: No acute abnormality.  Cardiomegaly. Electronically Signed   By: Placido Sou M.D.   On: 06/06/2022 17:29    Review of Systems  Constitutional: Negative.   HENT:         Right eye irritation and significant redness for a few weeks  Eyes: Negative.   Respiratory:  Negative for shortness of breath.   Cardiovascular:  Positive for chest pain.  Gastrointestinal: Negative.   Endocrine: Negative.   Genitourinary: Negative.   Musculoskeletal:         Left clavicle pain  Allergic/Immunologic: Negative.   Neurological: Negative.   Hematological: Negative.   Psychiatric/Behavioral: Negative.      Blood pressure (!) 166/95, pulse 70, temperature (!) 97.3 F (36.3 C), temperature source Temporal, resp. rate (!) 23, height '5\' 10"'$  (1.778 m), weight 64 kg, SpO2 100 %. Physical Exam HENT:     Mouth/Throat:     Mouth: Mucous membranes are moist.  Eyes:     Comments: Significant erythema and subconjunctival hemorrhage right eye  Neck:     Comments: Lower left anterior neck hematoma contiguous with hematoma around medial left clavicle area Cardiovascular:     Rate and Rhythm: Normal rate and regular rhythm.  Pulmonary:     Effort: Pulmonary effort is normal.     Breath sounds: Normal breath sounds.     Comments: Left upper chest wall clavicle area large hematoma Abdominal:     General: Abdomen is flat. There is no distension.     Palpations: Abdomen is soft.     Tenderness: There is no abdominal tenderness. There is no guarding.  Musculoskeletal:     Cervical back: Neck supple.     Comments: See clavicle deformity with hematoma as above  Skin:    General: Skin is warm.     Capillary Refill: Capillary refill takes 2 to 3 seconds.  Neurological:     Mental Status: He is alert and oriented to person, place, and time.  Psychiatric:        Mood and Affect: Mood normal.      Assessment/Plan Fall Left clavicle fracture with large hematoma -hold Coumadin, orthopedics consultation, sling, was reviewed by TCTS and there was no active bleeding or vascular injury seen Multiple medical problems including CHF, A-fib, pacemaker, diabetes -Home medicines  Admit to progressive unit to monitor hematoma.  If worsens may need to reverse Coumadin.  Orthopedics was consulted by the EDP.  I spoke with his family. Complex MDM  Zenovia Jarred, MD 06/06/2022, 7:34 PM

## 2022-06-06 NOTE — Progress Notes (Signed)
Orthopedic Tech Progress Note Patient Details:  CANDON CARAS [birth year] [id number] Level 2 Trauma. Not needed Patient ID: ANUAR WALGREN, male   DOB: [birth year], 88 y.o.   MRN: [id number]  Chip Boer 06/06/2022, 5:15 PM

## 2022-06-06 NOTE — ED Triage Notes (Signed)
Unwitnessed fall today at home. Pt anticoagulated with Eliquis. Large left subclavian hematoma noted.

## 2022-06-06 NOTE — ED Notes (Signed)
Patient was on warfarin and switched 2 weeks ago to eliquis

## 2022-06-06 NOTE — ED Notes (Signed)
Patient transported to CT with this RN on the monitor

## 2022-06-06 NOTE — ED Provider Notes (Signed)
[facility] EMERGENCY DEPARTMENT Provider Note   CSN: [id number] Arrival date & time: 06/06/22  1627     History  Chief Complaint  Patient presents with   [name] [name] is a 88 y.o. male.  88 year old male with prior medical history as detailed below presents for evaluation.  Patient reports accidental fall at approximately 1230 this afternoon.  Patient reports that he did not strike his head.  He does take Eliquis twice daily.  His last dose of Eliquis was earlier this morning.  He complains of developing hematoma over the left anterior superior chest wall.  He denies shortness of breath.  He denies difficulty swallowing or speaking.  He is able to ambulate after the fall without difficulty.  Patient with prior history significant for pacemaker placement.  Patient reports that his pacemaker has been operating without issues for quite some time.  He reports that his family interrogated the pacemaker at home prior to coming into the ED and were advised that he may have some "fluid around his pacemaker".  The history is provided by the patient and medical records.       Home Medications Prior to Admission medications   Medication Sig Start Date End Date Taking? Authorizing Provider  acetaminophen (TYLENOL) 325 MG tablet Take 1-2 tablets (325-650 mg total) by mouth every 4 (four) hours as needed for mild pain. 07/08/13   Erlene Quan, PA-C  budesonide-formoterol (SYMBICORT) 160-4.5 MCG/ACT inhaler Inhale 2 puffs into the lungs daily as needed (shortness of breath).    [provider]  carbidopa-levodopa (SINEMET IR) 25-100 MG per tablet Take 1 tablet by mouth 3 (three) times daily.    [provider]  furosemide (LASIX) 40 MG tablet Take 40 mg by mouth daily.    [provider]  lisinopril (ZESTRIL) 5 MG tablet Take 1 tablet by mouth once daily Patient taking differently: Take 5 mg by mouth daily. 03/24/20   Eileen Stanford,  PA-C  loratadine (CLARITIN) 10 MG tablet Take 10 mg by mouth daily.    [provider]  metoprolol succinate (TOPROL-XL) 25 MG 24 hr tablet Take 25 mg by mouth daily. Take with or immediately following a meal.    [provider]  Multiple Vitamins-Minerals (PRESERVISION AREDS 2) CAPS Take 2 capsules by mouth daily. Unknown strength    [provider]  mupirocin ointment (BACTROBAN) 2 % Apply 1 application. topically daily as needed (infection eye).    [provider]  Omega-3 Fatty Acids (FISH OIL) 1000 MG CAPS Take 2,000 mg by mouth daily.    [provider]  sitaGLIPtin (JANUVIA) 100 MG tablet Take 100 mg by mouth daily.    [provider]  triamcinolone cream (KENALOG) 0.1 % Apply 1 application. topically daily as needed (rash).    [provider]  vitamin E 400 UNIT capsule Take 400 Units by mouth daily.    [provider]  warfarin (COUMADIN) 2.5 MG tablet Take 2.5-5 mg by mouth See admin instructions. Take 5 mg Sun, Wed, Friday    Take 2.5 mg  on Monday Tuesday Thursday  and Saturday at betime    [provider]      Allergies    Amiodarone    Review of Systems   Review of Systems  All other systems reviewed and are negative.   Physical Exam Updated Vital Signs BP 116/86 (BP Location: Left Arm)   Pulse 75   Temp Marland Kitchen)  97.3 F (36.3 C) (Temporal)   Resp 18   Ht '5\' 10"'$  (1.778 m)   Wt 64 kg   SpO2 100%   BMI 20.24 kg/m  Physical Exam Vitals and nursing note reviewed.  Constitutional:      General: He is not in acute distress.    Appearance: Normal appearance. He is well-developed.  HENT:     Head: Normocephalic and atraumatic.  Eyes:     Conjunctiva/sclera: Conjunctivae normal.     Pupils: Pupils are equal, round, and reactive to light.  Cardiovascular:     Rate and Rhythm: Normal rate and regular rhythm.     Heart sounds: Normal heart sounds.  Pulmonary:     Effort: Pulmonary effort is  normal. No respiratory distress.     Breath sounds: Normal breath sounds.     Comments: Moderate hematoma overlying the left superior anterior chest wall.  Suspected hematoma extends from the proximal clavicle to the superior medial aspect of the pacemaker pocket.  Patient with clear breath sounds bilaterally.  No crepitus or bony step-off appreciated.  Patient with normal speech and no difficulty swallowing or breathing. Abdominal:     General: There is no distension.     Palpations: Abdomen is soft.     Tenderness: There is no abdominal tenderness.  Musculoskeletal:        General: No deformity. Normal range of motion.     Cervical back: Normal range of motion and neck supple.  Skin:    General: Skin is warm and dry.  Neurological:     General: No focal deficit present.     Mental Status: He is alert and oriented to person, place, and time.     ED Results / Procedures / Treatments   Labs (all labs ordered are listed, but only abnormal results are displayed) Labs Reviewed  I-STAT CHEM 8, ED - Abnormal; Notable for the following components:      Result Value   BUN 42 (*)    Creatinine, Ser 1.90 (*)    Glucose, Bld 137 (*)    Calcium, Ion 1.12 (*)    All other components within normal limits  CBC WITH DIFFERENTIAL/PLATELET  PROTIME-INR  BASIC METABOLIC PANEL  TYPE AND SCREEN    EKG EKG Interpretation  Date/Time:  Tuesday June 06 2022 17:16:44 EST Ventricular Rate:  72 PR Interval:  51 QRS Duration: 137 QT Interval:  488 QTC Calculation: 535 R Axis:   184 Text Interpretation: Sinus rhythm Short PR interval Nonspecific intraventricular conduction delay Anterior infarct, old Confirmed by Dene Gentry [phone]) on 06/06/2022 5:24:48 PM  Radiology No results found.  Procedures Procedures    Medications Ordered in ED Medications - No data to display  ED Course/ Medical Decision Making/ A&P                           Medical Decision Making Amount and/or  Complexity of Data Reviewed Labs: ordered. Radiology: ordered.  Risk Prescription drug management. Decision regarding hospitalization.    Medical Screen Complete  This patient presented to the ED with complaint of fall, left chest wall injury.  This complaint involves an extensive number of treatment options. The initial differential diagnosis includes, but is not limited to, trauma related to fall, fracture, hematoma, intrathoracic injury, metabolic abnormality, etc.  This presentation is: Acute, Chronic, Self-Limited, Previously Undiagnosed, Uncertain Prognosis, Complicated, Systemic Symptoms, and Threat to Life/Bodily Function  Patient presents after mechanical fall at  home.  Patient lost his balance and fell around 1230 this afternoon.  Patient came to the ED for evaluation of swelling and tenderness around the medial left clavicle.  Patient is on Eliquis.  His last dose of Eliquis was this morning.  Patient denies other injury.  Imaging reveals evidence of left proximal clavicle fracture with surrounding hematoma.  Case and images reviewed with CT surgery Dr. Cyndia Bent.  He does not feel that there is an indication for surgical intervention at this time.  Patient is 69, on Eliquis, and uses a cane with his right hand to assist in ambulation.  Given patient's injury it would seem prudent to observe this patient overnight.  Case discussed with Dr. Grandville Silos covering trauma.  Patient will be evaluated for admission.    Co morbidities that complicated the patient's evaluation  On Eliquis   Additional history obtained:  Additional history obtained from EMS, Spouse, and Family External records from outside sources obtained and reviewed including prior ED visits and prior Inpatient records.    Lab Tests:  I ordered and personally interpreted labs.  The pertinent results include:  CBC, BMP, INR, i-STAT Chem-8   Imaging Studies ordered:  I ordered imaging studies including  CT head, CT C-spine, CT chest with contrast, plain films of chest I independently visualized and interpreted obtained imaging which showed left proximal clavicle fracture with surrounding hematoma I agree with the radiologist interpretation.   Cardiac Monitoring:  The patient was maintained on a cardiac monitor.  I personally viewed and interpreted the cardiac monitor which showed an underlying rhythm of: NSR   Consultations Obtained:  I consulted CT Surgery/Trauma Surgery,  and discussed lab and imaging findings as well as pertinent plan of care.    Problem List / ED Course:  Fall, left proximal clavicle fracture   Reevaluation:  After the interventions noted above, I reevaluated the patient and found that they have: stayed the same   Disposition:  After consideration of the diagnostic results and the patients response to treatment, I feel that the patent would benefit from admission.   CRITICAL CARE Performed by: Valarie Merino   Total critical care time: 30 minutes  Critical care time was exclusive of separately billable procedures and treating other patients.  Critical care was necessary to treat or prevent imminent or life-threatening deterioration.  Critical care was time spent personally by me on the following activities: development of treatment plan with patient and/or surrogate as well as nursing, discussions with consultants, evaluation of patient's response to treatment, examination of patient, obtaining history from patient or surrogate, ordering and performing treatments and interventions, ordering and review of laboratory studies, ordering and review of radiographic studies, pulse oximetry and re-evaluation of patient's condition.          Final Clinical Impression(s) / ED Diagnoses Final diagnoses:  Fall, initial encounter  Traumatic closed fx of proximal clavicle with minimal displacement, left, initial encounter    Rx / DC Orders ED Discharge  Orders     None         Valarie Merino, MD 06/06/22 1951

## 2022-06-06 NOTE — Telephone Encounter (Signed)
The patient fell and looks like his pacemaker moved. His shoulder hurts some. I had the patient send a manual transmission. The patient was on the floor for at least 2 hours.   I had Mandy review the transmission and threshold  are fine,  normal device function. He did have some fluid on him. I let his daughter know that Leafy Ro advised for the patient to go to the hospital.

## 2022-06-06 NOTE — Progress Notes (Signed)
Orthopedic Tech Progress Note Patient Details:  [name] [name] 02-02-1927 [id number]     Post Interventions Patient Tolerated: Well Ortho Devices Type of Ortho Device: Shoulder immobilizer Ortho Device/Splint Location: LUE Ortho Device/Splint Interventions: Ordered, Application, Adjustment   Post Interventions Patient Tolerated: Well  Edwina Barth 06/06/2022, 7:49 PM

## 2022-06-06 NOTE — Progress Notes (Signed)
   06/06/22 1645  Clinical Encounter Type  Visited With Patient not available;Family  Visit Type Initial;Trauma  Referral From Nurse  Consult/Referral To Chaplain   Chaplain responded to a level two trauma. The patient, [name] [name], was under the care of the medical team. The patient's granddaughter was with him. I offered a listening ear is she wished to talk. She said she was ok right not. I shared that if she wished to visit with a chaplain later on to let a nurse know and someone would respond.   Danice Goltz [facility]  [phone]

## 2022-06-07 ENCOUNTER — Other Ambulatory Visit: Payer: Self-pay

## 2022-06-07 ENCOUNTER — Inpatient Hospital Stay (HOSPITAL_COMMUNITY): Payer: Medicare Other

## 2022-06-07 LAB — GLUCOSE, CAPILLARY
Glucose-Capillary: 171 mg/dL — ABNORMAL HIGH (ref 70–99)
Glucose-Capillary: 134 mg/dL — ABNORMAL HIGH (ref 70–99)
Glucose-Capillary: 119 mg/dL — ABNORMAL HIGH (ref 70–99)

## 2022-06-07 LAB — CUP PACEART REMOTE DEVICE CHECK
Battery Remaining Longevity: 98 mo
Battery Remaining Percentage: 95.5 %
Battery Voltage: 3.01 V
Date Time Interrogation Session: 20231128152238
Implantable Lead Connection Status: 753985
Implantable Lead Connection Status: 753985
Implantable Lead Implant Date: 20141226
Implantable Lead Implant Date: 20141226
Implantable Lead Location: 753858
Implantable Lead Location: 753860
Implantable Pulse Generator Implant Date: 20230329
Lead Channel Impedance Value: 380 Ohm
Lead Channel Impedance Value: 650 Ohm
Lead Channel Pacing Threshold Amplitude: 0.75 V
Lead Channel Pacing Threshold Amplitude: 1 V
Lead Channel Pacing Threshold Pulse Width: 0.4 ms
Lead Channel Pacing Threshold Pulse Width: 0.5 ms
Lead Channel Sensing Intrinsic Amplitude: 12 mV
Lead Channel Setting Pacing Amplitude: 2 V
Lead Channel Setting Pacing Amplitude: 2 V
Lead Channel Setting Pacing Pulse Width: 0.4 ms
Lead Channel Setting Pacing Pulse Width: 0.5 ms
Lead Channel Setting Sensing Sensitivity: 4 mV
Pulse Gen Model: 3562
Pulse Gen Serial Number: 8062428

## 2022-06-07 LAB — BASIC METABOLIC PANEL
Anion gap: 8 (ref 5–15)
BUN: 28 mg/dL — ABNORMAL HIGH (ref 8–23)
CO2: 23 mmol/L (ref 22–32)
Calcium: 8.4 mg/dL — ABNORMAL LOW (ref 8.9–10.3)
Chloride: 107 mmol/L (ref 98–111)
Creatinine, Ser: 1.86 mg/dL — ABNORMAL HIGH (ref 0.61–1.24)
GFR, Estimated: 33 mL/min — ABNORMAL LOW (ref >60)
Glucose, Bld: 118 mg/dL — ABNORMAL HIGH (ref 70–99)
Potassium: 4.7 mmol/L (ref 3.5–5.1)
Sodium: 138 mmol/L (ref 135–145)

## 2022-06-07 LAB — CBC
HCT: 40.4 % (ref 39.0–52.0)
Hemoglobin: 13.3 g/dL (ref 13.0–17.0)
MCH: 31.4 pg (ref 26.0–34.0)
MCHC: 32.9 g/dL (ref 30.0–36.0)
MCV: 95.5 fL (ref 80.0–100.0)
Platelets: 124 10*3/uL — ABNORMAL LOW (ref 150–400)
RBC: 4.23 MIL/uL (ref 4.22–5.81)
RDW: 15.5 % (ref 11.5–15.5)
WBC: 17.7 10*3/uL — ABNORMAL HIGH (ref 4.0–10.5)
nRBC: 0 % (ref 0.0–0.2)

## 2022-06-07 LAB — MRSA NEXT GEN BY PCR, NASAL: MRSA by PCR Next Gen: NOT DETECTED

## 2022-06-07 LAB — PROTIME-INR
INR: 1.5 — ABNORMAL HIGH (ref 0.8–1.2)
Prothrombin Time: 17.6 seconds — ABNORMAL HIGH (ref 11.4–15.2)

## 2022-06-07 MED ORDER — TRAMADOL HCL 50 MG PO TABS: 50.0000 mg | ORAL_TABLET | Freq: Four times a day (QID) | ORAL | Status: DC | PRN

## 2022-06-07 MED ORDER — MORPHINE SULFATE (PF) 4 MG/ML IV SOLN: 4.0000 mg | INTRAVENOUS | Status: DC | PRN

## 2022-06-07 NOTE — Evaluation (Signed)
Physical Therapy Evaluation Patient Details Name: [name] [name] MRN: [id number] DOB: Jan 16, 1927 Today's Date: 06/07/2022  History of Present Illness  Pt is a 88 yo male admitted after a fall at home resulting in L clavicle fx with hematoma. Pt also noted to have R eye irritation over last week for which he gets drops. 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 Ortho has been cousulted for clavicle fx. Pt in sling. PMH:CHF, pacemaker, Afib, HTN, Parkinsons, s/p TAVR 2019.  Clinical Impression  Patient presents with decreased mobility due to  decreased balance, decreased safety awareness, history of falls and poor compliance to DME.  Currently min A for ambulating with RW as now allowed to WBAT on L UE and tolerated well.  Seems his HHPT had recommend RW, but he preferred to use the cane.  Daughter reports planned transition to ALF on Dec 7, but they agree he may need to transition from STSNF to improve safety and independence.  PT will continue to follow.     Recommendations for follow up therapy are one component of a multi-disciplinary discharge planning process, led by the attending physician.  Recommendations may be updated based on patient status, additional functional criteria and insurance authorization.  Follow Up Recommendations Skilled nursing-short term rehab (<3 hours/day) Can patient physically be transported by private vehicle: Yes    Assistance Recommended at Discharge Frequent or constant Supervision/Assistance  Patient can return home with the following  A little help with walking and/or transfers;Assistance with cooking/housework;Direct supervision/assist for medications management;Assist for transportation;Help with stairs or ramp for entrance;A little help with bathing/dressing/bathroom    Equipment Recommendations None recommended by PT  Recommendations for Other Services       Functional Status Assessment Patient has had a recent decline in their functional  status and demonstrates the ability to make significant improvements in function in a reasonable and predictable amount of time.     Precautions / Restrictions Precautions Precautions: Fall Required Braces or Orthoses: Sling (for comfort) Restrictions Weight Bearing Restrictions: Yes LUE Weight Bearing: Weight bearing as tolerated      Mobility  Bed Mobility               General bed mobility comments: up in recliner    Transfers Overall transfer level: Needs assistance Equipment used: Rolling walker (2 wheels) Transfers: Sit to/from Stand Sit to Stand: Min assist           General transfer comment: up from low recliner with min A for balance rising to RW    Ambulation/Gait Ambulation/Gait assistance: Min assist, Min guard Gait Distance (Feet): 150 Feet (x 2) Assistive device: Rolling walker (2 wheels) Gait Pattern/deviations: Step-through pattern, Decreased stride length, Decreased dorsiflexion - left, Decreased dorsiflexion - right       General Gait Details: asked to don shoes so assisted prior to ambulation to don shoes.  Patient with bilateral decreased foot clearance with hitting lateral aspect of shoes first.  Min A during turns in hallway with RW as staying somewhat behind RW and had requested his cane though daughter reinforced even HHPT was asking him to use RW all the time  Stairs            Wheelchair Mobility    Modified Rankin (Stroke Patients Only)       Balance Overall balance assessment: Needs assistance   Sitting balance-Leahy Scale: Good Sitting balance - Comments: able to lift foot whiel sitting up on edge of chair to don shoes.  Standing balance support: Reliant on assistive device for balance Standing balance-Leahy Scale: Poor                               Pertinent Vitals/Pain Pain Assessment Pain Assessment: No/denies pain    Home Living Family/patient expects to be discharged to:: Private  residence Living Arrangements: Alone Available Help at Discharge: Family;Available PRN/intermittently Type of Home: House Home Access: Level entry       Home Layout: One level Home Equipment: Conservation officer, nature (2 wheels);Cane - single point;Tub bench;BSC/3in1;None Additional Comments: Pt's family checks in numous times a day and pt has HHPT and HH aid to assist with showering one time each week.  Pt was set to go to assited living facilty on Dec 7th.  WIll need to determine if pt can still go to ALF at current level.    Prior Function Prior Level of Function : History of Falls (last six months);Needs assist             Mobility Comments: waling with cane but doesn't use at all times. PT was recommending walker but pt prefers cane. ADLs Comments: HHAid is there to assist pt bathing in shower once a week. Pt can dress self and toilet on own.  Family assists with all meals and home management.     Hand Dominance   Dominant Hand: Right    Extremity/Trunk Assessment   Upper Extremity Assessment Upper Extremity Assessment: Defer to OT evaluation    Lower Extremity Assessment Lower Extremity Assessment: Generalized weakness (stiffness in heel cords)    Cervical / Trunk Assessment Cervical / Trunk Assessment: Kyphotic  Communication   Communication: HOH  Cognition Arousal/Alertness: Awake/alert Behavior During Therapy: WFL for tasks assessed/performed Overall Cognitive Status: History of cognitive impairments - at baseline                                 General Comments: oriented to place and situation, not time, family present        General Comments General comments (skin integrity, edema, etc.): Family reports two other recent falls one he was able to get up on his own, the other he was able to hit his life alert button, this time was not able to touch the button with his L hand as he had the clavicle fx and due to how he was laying.    Exercises      Assessment/Plan    PT Assessment Patient needs continued PT services  PT Problem List Decreased balance;Decreased knowledge of precautions;Decreased safety awareness;Decreased coordination;Decreased mobility;Decreased knowledge of use of DME       PT Treatment Interventions DME instruction;Functional mobility training;Balance training;Patient/family education;Therapeutic activities;Gait training;Therapeutic exercise    PT Goals (Current goals can be found in the Care Plan section)  Acute Rehab PT Goals Patient Stated Goal: to go to rehab prior to ALF PT Goal Formulation: With patient/family Time For Goal Achievement: 06/21/22 Potential to Achieve Goals: Good    Frequency Min 3X/week     Co-evaluation               AM-PAC PT "6 Clicks" Mobility  Outcome Measure Help needed turning from your back to your side while in a flat bed without using bedrails?: A Little Help needed moving from lying on your back to sitting on the side of a flat bed without using bedrails?:  A Little Help needed moving to and from a bed to a chair (including a wheelchair)?: A Little Help needed standing up from a chair using your arms (e.g., wheelchair or bedside chair)?: A Little Help needed to walk in hospital room?: A Little Help needed climbing 3-5 steps with a railing? : Total 6 Click Score: 16    End of Session Equipment Utilized During Treatment: Gait belt Activity Tolerance: Patient tolerated treatment well Patient left: in chair;with call bell/phone within reach;with chair alarm set;with family/visitor present   PT Visit Diagnosis: Other abnormalities of gait and mobility (R26.89);Repeated falls (R29.6);Other symptoms and signs involving the nervous system (R29.898)    Time: 1340-1407 PT Time Calculation (min) (ACUTE ONLY): 27 min   Charges:   PT Evaluation $PT Eval Moderate Complexity: 1 Mod PT Treatments $Gait Training: 8-22 mins        Magda Kiel, PT Acute Rehabilitation  Services [phone] 06/07/2022   Reginia Naas 06/07/2022, 6:06 PM

## 2022-06-07 NOTE — ED Notes (Signed)
Called [facility] x3 to request purple man with no answer.

## 2022-06-07 NOTE — ED Notes (Signed)
Attempted to call [facility] to request purple man with no answer.

## 2022-06-07 NOTE — Progress Notes (Addendum)
Admitted to [facility] from ED via stretcher by RN. Pt is A&OX4. VSS. Pt denied pain. Pt placed on telemetry. Sling in place on LUE. Pt has left clavicle area hematoma. Pt abrasion on right forearm, cleansed and foam applied.  Pt has excoriation on right groin, which was cleansed and barrier cream applied. Pt is has generalized weakness and limited movement in LUE. POC reviewed with pt. Skin assessment completed with Regino Schultze, RN. MRSA screening completed.

## 2022-06-07 NOTE — Progress Notes (Signed)
[facility] Progress Note     Subjective: CC-  Denies any current pain at rest, mild left clavicle pain with palpation. Denies noting any new injuries. Has not been OOB yet.  Objective: Vital signs in last 24 hours: Temp:  [97.3 F (36.3 C)-98.3 F (36.8 C)] 97.9 F (36.6 C) (11/29 0815) Pulse Rate:  [68-77] 70 (11/29 0815) Resp:  [15-26] 19 (11/29 0815) BP: (91-168)/(60-113) 91/62 (11/29 0815) SpO2:  [95 %-100 %] 98 % (11/29 0815) Weight:  [28 kg-68 kg] 68 kg (11/29 0516)    Intake/Output from previous day: No intake/output data recorded. Intake/Output this shift: No intake/output data recorded.  PE: Gen:  Alert, NAD, pleasant HEENT: EOM's intact, pupils equal and round, right subconjunctival hemorrhage  Card:  RRR, palpable pedal and radial pulses Pulm:  CTAB, no W/R/R, rate and effort normal on room air Abd: Soft, NT/ND Ext:  calves soft and nontender. LUE in sling, NVI, lower left neck/ anterior upper left shoulder hematoma present Psych: A&Ox4 Skin: no rashes noted, warm and dry  Lab Results:  Recent Labs    06/06/22 1704 06/06/22 1713 06/07/22 0020  WBC 13.0*  --  17.7*  HGB 13.5 14.3 13.3  HCT 42.7 42.0 40.4  PLT 144*  --  124*   BMET Recent Labs    06/06/22 1704 06/06/22 1713 06/07/22 0020  NA 143 143 138  K 5.0 4.9 4.7  CL 105 106 107  CO2 26  --  23  GLUCOSE 138* 137* 118*  BUN 30* 42* 28*  CREATININE 1.99* 1.90* 1.86*  CALCIUM 9.0  --  8.4*   PT/INR Recent Labs    06/06/22 1704 06/07/22 0020  LABPROT 17.2* 17.6*  INR 1.4* 1.5*   CMP     Component Value Date/Time   NA 138 06/07/2022 0020   NA 146 (H) 09/29/2021 1108   K 4.7 06/07/2022 0020   CL 107 06/07/2022 0020   CO2 23 06/07/2022 0020   GLUCOSE 118 (H) 06/07/2022 0020   BUN 28 (H) 06/07/2022 0020   BUN 28 09/29/2021 1108   CREATININE 1.86 (H) 06/07/2022 0020   CALCIUM 8.4 (L) 06/07/2022 0020   PROT 7.0 04/05/2018 1042   ALBUMIN 3.7 04/05/2018 1042   AST 27  04/05/2018 1042   ALT <5 04/05/2018 1042   ALKPHOS 49 04/05/2018 1042   BILITOT 1.4 (H) 04/05/2018 1042   GFRNONAA 33 (L) 06/07/2022 0020   GFRAA 47 (L) 04/22/2018 1516   Lipase  No results found for: "LIPASE"     Studies/Results: CT Head Wo Contrast  Result Date: 06/06/2022 CLINICAL DATA:  Trauma fall on Eliquis EXAM: CT HEAD WITHOUT CONTRAST CT CERVICAL SPINE WITHOUT CONTRAST TECHNIQUE: Multidetector CT imaging of the head and cervical spine was performed following the standard protocol without intravenous contrast. Multiplanar CT image reconstructions of the cervical spine were also generated. RADIATION DOSE REDUCTION: This exam was performed according to the departmental dose-optimization program which includes automated exposure control, adjustment of the mA and/or kV according to patient size and/or use of iterative reconstruction technique. COMPARISON:  None Available. FINDINGS: CT HEAD FINDINGS Brain: No acute territorial infarction or hemorrhage is visualized. 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 Mild mass effect on adjacent temporal lobe. No edema. No midline shift. Moderate atrophy. Patchy white matter hypodensity consistent with chronic small vessel ischemic changes. Multiple chronic appearing lacunar infarcts within the right thalamus and  bilateral basal ganglia. Nonenlarged ventricles Vascular: No hyperdense vessels. Vertebral and carotid vascular calcification Skull: Normal. Negative for fracture or focal lesion. Sinuses/Orbits: No acute finding. Other: None CT CERVICAL SPINE FINDINGS Alignment: Straightening of the cervical spine. No subluxation. Facet alignment is within normal limits Skull base and vertebrae: No acute fracture. No primary bone lesion or focal pathologic process. Soft tissues and spinal canal: No prevertebral fluid or swelling. No visible canal hematoma. Disc  levels: Multilevel degenerative change. Mild disc space narrowing C2 through C5 with moderate severe disc space narrowing C5 through C7. Facet degenerative changes at multiple levels. Upper chest: Lung apices are clear. Partially visualized hematoma at the left base of neck and involving the strap muscles. Incompletely visualized displaced fracture involving the medial left clavicle. Other: None IMPRESSION: 1. No CT evidence for acute intracranial abnormality. Atrophy and chronic small vessel ischemic changes of the white matter. 2. 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 3. Straightening of the cervical spine with degenerative changes. No acute osseous abnormality. 4. Partially visualized displaced fracture involving the medial head of the left clavicle. Partially visualized hematoma at the left base of neck and involving the strap muscles. Electronically Signed   By: Donavan Foil M.D.   On: 06/06/2022 18:01   CT Cervical Spine Wo Contrast  Result Date: 06/06/2022 CLINICAL DATA:  Trauma fall on Eliquis EXAM: CT HEAD WITHOUT CONTRAST CT CERVICAL SPINE WITHOUT CONTRAST TECHNIQUE: Multidetector CT imaging of the head and cervical spine was performed following the standard protocol without intravenous contrast. Multiplanar CT image reconstructions of the cervical spine were also generated. RADIATION DOSE REDUCTION: This exam was performed according to the departmental dose-optimization program which includes automated exposure control, adjustment of the mA and/or kV according to patient size and/or use of iterative reconstruction technique. COMPARISON:  None Available. FINDINGS: CT HEAD FINDINGS Brain: No acute territorial infarction or hemorrhage is visualized. 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 Mild mass effect on  adjacent temporal lobe. No edema. No midline shift. Moderate atrophy. Patchy white matter hypodensity consistent with chronic small vessel ischemic changes. Multiple chronic appearing lacunar infarcts within the right thalamus and bilateral basal ganglia. Nonenlarged ventricles Vascular: No hyperdense vessels. Vertebral and carotid vascular calcification Skull: Normal. Negative for fracture or focal lesion. Sinuses/Orbits: No acute finding. Other: None CT CERVICAL SPINE FINDINGS Alignment: Straightening of the cervical spine. No subluxation. Facet alignment is within normal limits Skull base and vertebrae: No acute fracture. No primary bone lesion or focal pathologic process. Soft tissues and spinal canal: No prevertebral fluid or swelling. No visible canal hematoma. Disc levels: Multilevel degenerative change. Mild disc space narrowing C2 through C5 with moderate severe disc space narrowing C5 through C7. Facet degenerative changes at multiple levels. Upper chest: Lung apices are clear. Partially visualized hematoma at the left base of neck and involving the strap muscles. Incompletely visualized displaced fracture involving the medial left clavicle. Other: None IMPRESSION: 1. No CT evidence for acute intracranial abnormality. Atrophy and chronic small vessel ischemic changes of the white matter. 2. 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 3. Straightening of the cervical spine with degenerative changes. No acute osseous abnormality. 4. Partially visualized displaced fracture involving the medial head of the left clavicle. Partially visualized hematoma at the left base of neck  and involving the strap muscles. Electronically Signed   By: Donavan Foil M.D.   On: 06/06/2022 18:01   CT Chest W Contrast  Result Date: 06/06/2022 CLINICAL DATA:  Chest trauma after fall; on Eliquis with hematoma over the proximal left clavicle  EXAM: CT CHEST WITH CONTRAST TECHNIQUE: Multidetector CT imaging of the chest was performed during intravenous contrast administration. RADIATION DOSE REDUCTION: This exam was performed according to the departmental dose-optimization program which includes automated exposure control, adjustment of the mA and/or kV according to patient size and/or use of iterative reconstruction technique. CONTRAST:  77m OMNIPAQUE IOHEXOL 350 MG/ML SOLN COMPARISON:  Radiographs earlier today and CTA chest 03/21/2018 FINDINGS: Cardiovascular: Left chest wall CRT-P. TAVR. Coronary artery and aortic athero sclerotic calcification. No pericardial effusion. Mild cardiac enlargement. Mediastinum/Nodes: Unchanged 2.4 cm left thyroid nodule. This is stable from 2019 and should be benign. No follow-up recommended. No thoracic adenopathy by size. Unremarkable esophagus. Lungs/Pleura: No focal consolidation, pleural effusion, or pneumothorax. Mild centrilobular micro nodularity in the right lower lobe and lingula with mild bronchiolectasis. Upper Abdomen: Low-attenuation lesions in the kidneys are statistically likely to represent cysts. No follow-up is required. 5.6 cm cyst in the pancreatic tail unchanged from 2019. No acute abnormality. Musculoskeletal: Acute displaced fracture of the proximal left clavicle. The distal fragment is displaced anteriorly proximally 9 mm. Associated ill-defined hyperdense collection within the pectoralis muscles compatible with hematoma. No definite active bleeding though evaluation is limited by streak artifact from adjacent pacemaker. Healed posttraumatic deformity right clavicle. IMPRESSION: 1. Acute displaced fracture of the proximal left clavicle with adjacent intramuscular hematoma. No definite active bleeding though evaluation is limited by streak artifact from adjacent pacemaker. 2. Mild small airway infection/inflammation. 3. Coronary artery and Aortic Atherosclerosis (ICD10-I70.0). Electronically  Signed   By: TPlacido SouM.D.   On: 06/06/2022 17:58   DG Chest Port 1 View  Result Date: 06/06/2022 CLINICAL DATA:  Fall on blood thinners EXAM: PORTABLE CHEST 1 VIEW COMPARISON:  Radiographs 04/09/2018 FINDINGS: Stable cardiomegaly. Aortic atherosclerotic calcification. TAVR. Left chest wall CRT-P. No focal consolidation, pleural effusion, or pneumothorax. No acute osseous abnormality. Remote left rib fractures. IMPRESSION: No acute abnormality.  Cardiomegaly. Electronically Signed   By: TPlacido SouM.D.   On: 06/06/2022 17:29    Anti-infectives: Anti-infectives (From admission, onward)    None        Assessment/Plan Multiple recent falls  Left clavicle fracture with large hematoma - hold eliquis - discussed with family that we recommend holding this indefinitely given his multiple recent falls. Was reviewed by [facility] and there was no active bleeding or vascular injury seen. 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 I spoke with the patient's family at bedside. [facility], we will see how he does with therapies.  I reviewed last 24 h vitals and pain scores, last 48 h intake and output, last 24 h labs and trends, and last 24 h imaging results    LOS: 1 day    [facility] 06/07/2022, 9:56 AM Please see Amion for pager number during day hours 7:00am-4:30pm

## 2022-06-07 NOTE — Consult Note (Signed)
Reason for Consult:Left clavicle fx Referring Physician: Reather Laurence Time called: [id number] Time at bedside: [address]   [name] [name] is an 88 y.o. male.  HPI: [name] fell at home yesterday. He was brought to the ED where workup showed a left clavicle fx. He was admitted and orthopedic surgery was consulted the following morning. He is RHD.  Past Medical History:  Diagnosis Date   Acute on chronic combined systolic and diastolic CHF (congestive heart failure) ([place]) 06/29/2013   Atrial fibrillation (HCC)    echo- EF 35-40%; LA severely dilated; RA mod dilated, RV systolic pressure 32KGUR; LV systolic fcn mod reduced   Biventricular cardiac pacemaker upgrade 07/05/13 (St Jude) 06/29/2013   Cancer ([place])    skin cancer   Cardiomyopathy ([place]) 07/07/2013   Non Obstructive CAD by Cath    CHF (congestive heart failure) (HCC)    Critical aortic valve stenosis 06/29/2013   Current use of long term anticoagulation 06/29/2013   Diabetes mellitus without complication ([place])    Hypertension    Parkinson's disease 07/07/2013   Permanent atrial fibrillation- AVN RFA 07/05/13 06/29/2013   Presence of permanent cardiac pacemaker    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   29 mm Edwards Sapien 3 transcatheter heart valve placed via percutaneous right transfemoral approach    Severe aortic stenosis    SSS (sick sinus syndrome) ([place])    pacemaker placed in 1993   Tremors of nervous system     Past Surgical History:  Procedure Laterality Date   AV NODE ABLATION Bilateral 07/04/2013   Procedure: AV NODE ABLATION;  Surgeon: Evans Lance, MD;  Location: [facility] CATH LAB;  Service: Cardiovascular;  Laterality: Bilateral;   BI-VENTRICULAR PACEMAKER INSERTION Bilateral 07/04/2013   Procedure: BI-VENTRICULAR PACEMAKER INSERTION (CRT-P);  Surgeon: Evans Lance, MD;  Location: [facility] CATH LAB;  Service: Cardiovascular;  Laterality: Bilateral;   BIV PACEMAKER GENERATOR CHANGE OUT  11/2004   BIV  PACEMAKER GENERATOR CHANGEOUT N/A 10/05/2021   Procedure: BIVI PACEMAKER GENERATOR CHANGEOUT;  Surgeon: Constance Haw, MD;  Location: [place] CV LAB;  Service: Cardiovascular;  Laterality: N/A;   COLONOSCOPY     HERNIA REPAIR     left groin   INSERT / REPLACE / REMOVE PACEMAKER     INTRAOPERATIVE TRANSTHORACIC ECHOCARDIOGRAM N/A 04/09/2018   Procedure: INTRAOPERATIVE TRANSTHORACIC ECHOCARDIOGRAM;  Surgeon: Burnell Blanks, MD;  Location: [place];  Service: Open Heart Surgery;  Laterality: N/A;   LEFT HEART CATHETERIZATION WITH CORONARY ANGIOGRAM N/A 07/07/2013   Procedure: LEFT HEART CATHETERIZATION WITH CORONARY ANGIOGRAM;  Surgeon: Pixie Casino, MD;  Location: [facility] CATH LAB;  Service: Cardiovascular;  Laterality: N/A;   PACEMAKER INSERTION  1993   RIGHT/LEFT HEART CATH AND CORONARY ANGIOGRAPHY N/A 03/06/2018   Procedure: RIGHT/LEFT HEART CATH AND CORONARY ANGIOGRAPHY;  Surgeon: Burnell Blanks, MD;  Location: [facility] CV LAB;  Service: Cardiovascular;  Laterality: N/A;   TONSILLECTOMY     adenoidectomy   TRANSCATHETER AORTIC VALVE REPLACEMENT, TRANSFEMORAL  04/09/2018   TRANSCATHETER AORTIC VALVE REPLACEMENT, TRANSFEMORAL Right 04/09/2018   Procedure: TRANSCATHETER AORTIC VALVE REPLACEMENT, TRANSFEMORAL. 12m Edwards Sapien 3 THV.;  Surgeon: MBurnell Blanks MD;  [place]  Service: Open Heart Surgery;  Laterality: Right;    Family History  Problem Relation Age of Onset   Asthma Father    Stroke Mother    Coronary artery disease Mother     Social History:  reports that he quit smoking about 52 years ago.  His smoking use included cigarettes. He has never used smokeless tobacco. He reports that he does not drink alcohol and does not use drugs.  Allergies:  Allergies  Allergen Reactions   Amiodarone Other (See Comments)    Amiodarone induced hepatitis    Medications: I have reviewed the patient's current medications.  Results for orders  placed or performed during the hospital encounter of 06/06/22 (from the past 48 hour(s))  CBC with Differential     Status: Abnormal   Collection Time: 06/06/22  5:04 PM  Result Value Ref Range   WBC 13.0 (H) 4.0 - 10.5 K/uL   RBC 4.37 4.22 - 5.81 MIL/uL   Hemoglobin 13.5 13.0 - 17.0 g/dL   HCT 42.7 39.0 - 52.0 %   MCV 97.7 80.0 - 100.0 fL   MCH 30.9 26.0 - 34.0 pg   MCHC 31.6 30.0 - 36.0 g/dL   RDW 15.5 11.5 - 15.5 %   Platelets 144 (L) 150 - 400 K/uL   nRBC 0.0 0.0 - 0.2 %   Neutrophils Relative % 77 %   Neutro Abs 10.1 (H) 1.7 - 7.7 K/uL   Lymphocytes Relative 12 %   Lymphs Abs 1.5 0.7 - 4.0 K/uL   Monocytes Relative 7 %   Monocytes Absolute 0.9 0.1 - 1.0 K/uL   Eosinophils Relative 2 %   Eosinophils Absolute 0.2 0.0 - 0.5 K/uL   Basophils Relative 1 %   Basophils Absolute 0.1 0.0 - 0.1 K/uL   Immature Granulocytes 1 %   Abs Immature Granulocytes 0.15 (H) 0.00 - 0.07 K/uL    Comment: Performed at [facility], [address], [place], [place] [address]  Protime-INR     Status: Abnormal   Collection Time: 06/06/22  5:04 PM  Result Value Ref Range   Prothrombin Time 17.2 (H) 11.4 - 15.2 seconds   INR 1.4 (H) 0.8 - 1.2    Comment: (NOTE) INR goal varies based on device and disease states. Performed at [facility], [place] [address] [place], [place] [address]   Type and screen [place]     Status: None   Collection Time: 06/06/22  5:04 PM  Result Value Ref Range   ABO/RH(D) A POS    Antibody Screen NEG    Sample Expiration      06/09/2022,2359 Performed at [facility], [place] [address] [place], [place] [address]   Basic metabolic panel     Status: Abnormal   Collection Time: 06/06/22  5:04 PM  Result Value Ref Range   Sodium 143 135 - 145 mmol/L   Potassium 5.0 3.5 - 5.1 mmol/L    Comment: HEMOLYSIS AT THIS LEVEL MAY AFFECT RESULT   Chloride 105 98 - 111 mmol/L   CO2 26 22 - 32 mmol/L   Glucose, Bld 138 (H) 70 - 99 mg/dL     Comment: Glucose reference range applies only to samples taken after fasting for at least 8 hours.   BUN 30 (H) 8 - 23 mg/dL   Creatinine, Ser 1.99 (H) 0.61 - 1.24 mg/dL   Calcium 9.0 8.9 - 10.3 mg/dL   GFR, Estimated 31 (L) >60 mL/min    Comment: (NOTE) Calculated using the CKD-EPI Creatinine Equation (2021)    Anion gap 12 5 - 15    Comment: Performed at [facility] [address] [place], [place] [address]  I-stat chem 8, ED     Status: Abnormal  Collection Time: 06/06/22  5:13 PM  Result Value Ref Range   Sodium 143 135 - 145 mmol/L   Potassium 4.9 3.5 - 5.1 mmol/L   Chloride 106 98 - 111 mmol/L   BUN 42 (H) 8 - 23 mg/dL   Creatinine, Ser 1.90 (H) 0.61 - 1.24 mg/dL   Glucose, Bld 137 (H) 70 - 99 mg/dL    Comment: Glucose reference range applies only to samples taken after fasting for at least 8 hours.   Calcium, Ion 1.12 (L) 1.15 - 1.40 mmol/L   TCO2 29 22 - 32 mmol/L   Hemoglobin 14.3 13.0 - 17.0 g/dL   HCT 42.0 39.0 - 52.0 %  CBC     Status: Abnormal   Collection Time: 06/07/22 12:20 AM  Result Value Ref Range   WBC 17.7 (H) 4.0 - 10.5 K/uL   RBC 4.23 4.22 - 5.81 MIL/uL   Hemoglobin 13.3 13.0 - 17.0 g/dL   HCT 40.4 39.0 - 52.0 %   MCV 95.5 80.0 - 100.0 fL   MCH 31.4 26.0 - 34.0 pg   MCHC 32.9 30.0 - 36.0 g/dL   RDW 15.5 11.5 - 15.5 %   Platelets 124 (L) 150 - 400 K/uL   nRBC 0.0 0.0 - 0.2 %    Comment: Performed at [facility], [place] [address] [place], [place] [address]  Basic metabolic panel     Status: Abnormal   Collection Time: 06/07/22 12:20 AM  Result Value Ref Range   Sodium 138 135 - 145 mmol/L   Potassium 4.7 3.5 - 5.1 mmol/L   Chloride 107 98 - 111 mmol/L   CO2 23 22 - 32 mmol/L   Glucose, Bld 118 (H) 70 - 99 mg/dL    Comment: Glucose reference range applies only to samples taken after fasting for at least 8 hours.   BUN 28 (H) 8 - 23 mg/dL   Creatinine, Ser 1.86 (H) 0.61 - 1.24 mg/dL   Calcium 8.4 (L) 8.9 - 10.3 mg/dL   GFR,  Estimated 33 (L) >60 mL/min    Comment: (NOTE) Calculated using the CKD-EPI Creatinine Equation (2021)    Anion gap 8 5 - 15    Comment: Performed at [facility] [address] [place], [place] [address]  Protime-INR     Status: Abnormal   Collection Time: 06/07/22 12:20 AM  Result Value Ref Range   Prothrombin Time 17.6 (H) 11.4 - 15.2 seconds   INR 1.5 (H) 0.8 - 1.2    Comment: (NOTE) INR goal varies based on device and disease states. Performed at [facility], [place] [address], [place], [place] [address]   MRSA Next Gen by PCR, Nasal     Status: None   Collection Time: 06/07/22  5:15 AM   Specimen: Nasal Mucosa; Nasal Swab  Result Value Ref Range   MRSA by PCR Next Gen NOT DETECTED NOT DETECTED    Comment: (NOTE) The GeneXpert MRSA Assay (FDA approved for NASAL specimens only), is one component of a comprehensive MRSA colonization surveillance program. It is not intended to diagnose MRSA infection nor to guide or monitor treatment for MRSA infections. Test performance is not FDA approved in patients less than 55 years old. Performed at [facility], [place] [address], [place], [place] [address]   Glucose, capillary     Status: Abnormal   Collection Time: 06/07/22  8:14 AM  Result Value Ref Range   Glucose-Capillary 171 (H)  70 - 99 mg/dL    Comment: Glucose reference range applies only to samples taken after fasting for at least 8 hours.    DG Clavicle Left  Result Date: 06/07/2022 CLINICAL DATA:  Fracture of unspecified part of left clavicle, initial encounter for closed fracture EXAM: LEFT CLAVICLE - 2+ VIEWS COMPARISON:  Radiograph 06/06/2022 FINDINGS: Displaced left medial clavicle fracture as seen on recent CT. There is a chronic right clavicle fracture. There is chronic widening of the left AC joint with small ossified joint body. IMPRESSION: Displaced left medial clavicle fracture as seen on recent CT. Electronically Signed   By: Maurine Simmering M.D.    On: 06/07/2022 10:25   CUP PACEART REMOTE DEVICE CHECK  Result Date: 06/07/2022 Scheduled remote reviewed. Normal device function.  Permanent AF, Warfarin [place] below threshold x 10 days Next remote 91 days. LA  CT Head Wo Contrast  Result Date: 06/06/2022 CLINICAL DATA:  Trauma fall on Eliquis EXAM: CT HEAD WITHOUT CONTRAST CT CERVICAL SPINE WITHOUT CONTRAST TECHNIQUE: Multidetector CT imaging of the head and cervical spine was performed following the standard protocol without intravenous contrast. Multiplanar CT image reconstructions of the cervical spine were also generated. RADIATION DOSE REDUCTION: This exam was performed according to the departmental dose-optimization program which includes automated exposure control, adjustment of the mA and/or kV according to patient size and/or use of iterative reconstruction technique. COMPARISON:  None Available. FINDINGS: CT HEAD FINDINGS Brain: No acute territorial infarction or hemorrhage is visualized. 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 Mild mass effect on adjacent temporal lobe. No edema. No midline shift. Moderate atrophy. Patchy white matter hypodensity consistent with chronic small vessel ischemic changes. Multiple chronic appearing lacunar infarcts within the right thalamus and bilateral basal ganglia. Nonenlarged ventricles Vascular: No hyperdense vessels. Vertebral and carotid vascular calcification Skull: Normal. Negative for fracture or focal lesion. Sinuses/Orbits: No acute finding. Other: None CT CERVICAL SPINE FINDINGS Alignment: Straightening of the cervical spine. No subluxation. Facet alignment is within normal limits Skull base and vertebrae: No acute fracture. No primary bone lesion or focal pathologic process. Soft tissues and spinal canal: No prevertebral fluid or swelling. No visible canal hematoma. Disc levels: Multilevel  degenerative change. Mild disc space narrowing C2 through C5 with moderate severe disc space narrowing C5 through C7. Facet degenerative changes at multiple levels. Upper chest: Lung apices are clear. Partially visualized hematoma at the left base of neck and involving the strap muscles. Incompletely visualized displaced fracture involving the medial left clavicle. Other: None IMPRESSION: 1. No CT evidence for acute intracranial abnormality. Atrophy and chronic small vessel ischemic changes of the white matter. 2. 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 3. Straightening of the cervical spine with degenerative changes. No acute osseous abnormality. 4. Partially visualized displaced fracture involving the medial head of the left clavicle. Partially visualized hematoma at the left base of neck and involving the strap muscles. Electronically Signed   By: Donavan Foil M.D.   On: 06/06/2022 18:01   CT Cervical Spine Wo Contrast  Result Date: 06/06/2022 CLINICAL DATA:  Trauma fall on Eliquis EXAM: CT HEAD WITHOUT CONTRAST CT CERVICAL SPINE WITHOUT CONTRAST TECHNIQUE: Multidetector CT imaging of the head and cervical spine was performed following the standard protocol without intravenous contrast. Multiplanar CT image reconstructions of the cervical spine were also generated. RADIATION DOSE REDUCTION: This  exam was performed according to the departmental dose-optimization program which includes automated exposure control, adjustment of the mA and/or kV according to patient size and/or use of iterative reconstruction technique. COMPARISON:  None Available. FINDINGS: CT HEAD FINDINGS Brain: No acute territorial infarction or hemorrhage is visualized. 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 Mild mass effect on adjacent temporal  lobe. No edema. No midline shift. Moderate atrophy. Patchy white matter hypodensity consistent with chronic small vessel ischemic changes. Multiple chronic appearing lacunar infarcts within the right thalamus and bilateral basal ganglia. Nonenlarged ventricles Vascular: No hyperdense vessels. Vertebral and carotid vascular calcification Skull: Normal. Negative for fracture or focal lesion. Sinuses/Orbits: No acute finding. Other: None CT CERVICAL SPINE FINDINGS Alignment: Straightening of the cervical spine. No subluxation. Facet alignment is within normal limits Skull base and vertebrae: No acute fracture. No primary bone lesion or focal pathologic process. Soft tissues and spinal canal: No prevertebral fluid or swelling. No visible canal hematoma. Disc levels: Multilevel degenerative change. Mild disc space narrowing C2 through C5 with moderate severe disc space narrowing C5 through C7. Facet degenerative changes at multiple levels. Upper chest: Lung apices are clear. Partially visualized hematoma at the left base of neck and involving the strap muscles. Incompletely visualized displaced fracture involving the medial left clavicle. Other: None IMPRESSION: 1. No CT evidence for acute intracranial abnormality. Atrophy and chronic small vessel ischemic changes of the white matter. 2. 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 3. Straightening of the cervical spine with degenerative changes. No acute osseous abnormality. 4. Partially visualized displaced fracture involving the medial head of the left clavicle. Partially visualized hematoma at the left base of neck and involving the strap muscles. Electronically Signed   By: Donavan Foil M.D.   On: 06/06/2022 18:01   CT Chest W Contrast  Result Date: 06/06/2022 CLINICAL DATA:  Chest trauma after fall; on Eliquis with hematoma over the proximal left clavicle EXAM: CT CHEST WITH  CONTRAST TECHNIQUE: Multidetector CT imaging of the chest was performed during intravenous contrast administration. RADIATION DOSE REDUCTION: This exam was performed according to the departmental dose-optimization program which includes automated exposure control, adjustment of the mA and/or kV according to patient size and/or use of iterative reconstruction technique. CONTRAST:  27m OMNIPAQUE IOHEXOL 350 MG/ML SOLN COMPARISON:  Radiographs earlier today and CTA chest 03/21/2018 FINDINGS: Cardiovascular: Left chest wall CRT-P. TAVR. Coronary artery and aortic athero sclerotic calcification. No pericardial effusion. Mild cardiac enlargement. Mediastinum/Nodes: Unchanged 2.4 cm left thyroid nodule. This is stable from 2019 and should be benign. No follow-up recommended. No thoracic adenopathy by size. Unremarkable esophagus. Lungs/Pleura: No focal consolidation, pleural effusion, or pneumothorax. Mild centrilobular micro nodularity in the right lower lobe and lingula with mild bronchiolectasis. Upper Abdomen: Low-attenuation lesions in the kidneys are statistically likely to represent cysts. No follow-up is required. 5.6 cm cyst in the pancreatic tail unchanged from 2019. No acute abnormality. Musculoskeletal: Acute displaced fracture of the proximal left clavicle. The distal fragment is displaced anteriorly proximally 9 mm. Associated ill-defined hyperdense collection within the pectoralis muscles compatible with hematoma. No definite active bleeding though evaluation is limited by streak artifact from adjacent pacemaker. Healed posttraumatic deformity right clavicle. IMPRESSION: 1. Acute displaced fracture of the proximal left clavicle with adjacent intramuscular hematoma. No definite active bleeding though evaluation is limited by streak artifact from adjacent pacemaker.  2. Mild small airway infection/inflammation. 3. Coronary artery and Aortic Atherosclerosis (ICD10-I70.0). Electronically Signed   By: Placido Sou M.D.   On: 06/06/2022 17:58   DG Chest Port 1 View  Result Date: 06/06/2022 CLINICAL DATA:  Fall on blood thinners EXAM: PORTABLE CHEST 1 VIEW COMPARISON:  Radiographs 04/09/2018 FINDINGS: Stable cardiomegaly. Aortic atherosclerotic calcification. TAVR. Left chest wall CRT-P. No focal consolidation, pleural effusion, or pneumothorax. No acute osseous abnormality. Remote left rib fractures. IMPRESSION: No acute abnormality.  Cardiomegaly. Electronically Signed   By: Placido Sou M.D.   On: 06/06/2022 17:29    Review of Systems  Unable to perform ROS: Dementia   Blood pressure 91/62, pulse 70, temperature 97.9 F (36.6 C), temperature source Oral, resp. rate 19, height '5\' 10"'$  (1.778 m), weight 68 kg, SpO2 98 %. Physical Exam Constitutional:      General: He is not in acute distress.    Appearance: He is well-developed. He is not diaphoretic.  HENT:     Head: Normocephalic and atraumatic.  Eyes:     General: No scleral icterus.       Right eye: No discharge.        Left eye: No discharge.     Conjunctiva/sclera: Conjunctivae normal.  Cardiovascular:     Rate and Rhythm: Normal rate and regular rhythm.  Pulmonary:     Effort: Pulmonary effort is normal. No respiratory distress.  Musculoskeletal:     Cervical back: Normal range of motion.     Comments: Left shoulder, elbow, wrist, digits- no skin wounds, ecchymotic over medial clavicular area, no tenting, no instability, no blocks to motion  Sens  Ax/R/M/U could not assess  Mot   Ax/ R/ PIN/ M/ AIN/ U grossly intact  Rad 2+  Skin:    General: Skin is warm and dry.  Neurological:     Mental Status: He is alert.  Psychiatric:        Mood and Affect: Mood normal.        Behavior: Behavior normal.     Assessment/Plan: Left clavicle fx -- Should be stable enough for use given location, even with displacement. May be WBAT, sling for comfort only. F/u with Dr. Doreatha Martin in 2 weeks. Multiple medical problems including CHF,  Parkinson's, CAD with pacemaker, afib on coumadin, AS, HTN, and DM -- per primary service    Lisette Abu, PA-C Orthopedic Surgery [phone] 06/07/2022, 11:30 AM

## 2022-06-07 NOTE — ED Notes (Signed)
Prog / called for purple man

## 2022-06-07 NOTE — TOC CAGE-AID Note (Signed)
Transition of Care [facility]) - CAGE-AID Screening   Patient Details  Name: [name] [name] MRN: [id number] Date of Birth: August 05, 1926  Transition of Care [facility]) CM/SW Contact:    Army Melia, RN Phone Number:[phone] 06/07/2022, 9:44 PM   Clinical Narrative: Presents after a fall resulting in left clavicle fx/hematoma. Denies drug and alcohol use, no resources indicated.   CAGE-AID Screening:    Have You Ever Felt You Ought to Cut Down on Your Drinking or Drug Use?: No Have People Annoyed You By Critizing Your Drinking Or Drug Use?: No Have You Felt Bad Or Guilty About Your Drinking Or Drug Use?: No Have You Ever Had a Drink or Used Drugs First Thing In The Morning to Steady Your Nerves or to Get Rid of a Hangover?: No CAGE-AID Score: 0  Substance Abuse Education Offered: No

## 2022-06-07 NOTE — ED Notes (Signed)
ED TO INPATIENT HANDOFF REPORT  ED Nurse Name and Phone #: Stanton Kidney, RN  S Name/Age/Gender [name] [name] 88 y.o. male Room/Bed: 009C/009C  Code Status   Code Status: Full Code  Home/SNF/Other Home Patient oriented to: self, place, time, and situation Is this baseline? Yes   Triage Complete: Triage complete  Chief Complaint Chest wall hematoma [S20.219A]  Triage Note Unwitnessed fall today at home. Pt anticoagulated with Eliquis. Large left subclavian hematoma noted.    Allergies Allergies  Allergen Reactions   Amiodarone Other (See Comments)    Amiodarone induced hepatitis    Level of Care/Admitting Diagnosis ED Disposition     ED Disposition  Admit   Condition  --   Comment  Hospital Area: [facility] [[id number]]  Level of Care: Progressive [102]  Admit to Progressive based on following criteria: Other see comments  Comments: trauma  May admit patient to [facility] or [facility] if equivalent level of care is available:: No  Covid Evaluation: Asymptomatic - no recent exposure (last 10 days) testing not required  Diagnosis: Chest wall hematoma [[id number]]  Admitting Physician: [place], [place]  Attending Physician: TRAUMA MD [[id number]]  Bed request comments: 4NP, 4E  Certification:: I certify this patient will need inpatient services for at least 2 midnights  Estimated Length of Stay: 2          B Medical/Surgery History Past Medical History:  Diagnosis Date   Acute on chronic combined systolic and diastolic CHF (congestive heart failure) ([place]) 06/29/2013   Atrial fibrillation ([place])    echo- EF 35-40%; LA severely dilated; RA mod dilated, RV systolic pressure 13KGMW; LV systolic fcn mod reduced   Biventricular cardiac pacemaker upgrade 07/05/13 ([employer] Jude) 06/29/2013   Cancer ([place])    skin cancer   Cardiomyopathy ([place]) 07/07/2013   Non Obstructive CAD by Cath    CHF (congestive heart failure) ([place])    Critical aortic valve stenosis  06/29/2013   Current use of long term anticoagulation 06/29/2013   Diabetes mellitus without complication ([place])    Hypertension    Parkinson's disease 07/07/2013   Permanent atrial fibrillation- AVN RFA 07/05/13 06/29/2013   Presence of permanent cardiac pacemaker    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   29 mm Edwards Sapien 3 transcatheter heart valve placed via percutaneous right transfemoral approach    Severe aortic stenosis    SSS (sick sinus syndrome) ([place])    pacemaker placed in 1993   Tremors of nervous system    Past Surgical History:  Procedure Laterality Date   AV NODE ABLATION Bilateral 07/04/2013   Procedure: AV NODE ABLATION;  Surgeon: Evans Lance, MD;  Location: [facility] CATH LAB;  Service: Cardiovascular;  Laterality: Bilateral;   BI-VENTRICULAR PACEMAKER INSERTION Bilateral 07/04/2013   Procedure: BI-VENTRICULAR PACEMAKER INSERTION (CRT-P);  Surgeon: Evans Lance, MD;  Location[facility] CATH LAB;  Service: Cardiovascular;  Laterality: Bilateral;   BIV PACEMAKER GENERATOR CHANGE OUT  11/2004   BIV PACEMAKER GENERATOR CHANGEOUT N/A 10/05/2021   Procedure: BIVI PACEMAKER GENERATOR CHANGEOUT;  Surgeon: Constance Haw, MD;  Location: [facility] CV LAB;  Service: Cardiovascular;  Laterality: N/A;   COLONOSCOPY     HERNIA REPAIR     left groin   INSERT / REPLACE / REMOVE PACEMAKER     INTRAOPERATIVE TRANSTHORACIC ECHOCARDIOGRAM N/A 04/09/2018   Procedure: INTRAOPERATIVE TRANSTHORACIC ECHOCARDIOGRAM;  Surgeon: Burnell Blanks, MD;  Location: [facility];  Service: Open Heart Surgery;  Laterality: N/A;  LEFT HEART CATHETERIZATION WITH CORONARY ANGIOGRAM N/A 07/07/2013   Procedure: LEFT HEART CATHETERIZATION WITH CORONARY ANGIOGRAM;  Surgeon: Pixie Casino, MD;  Location[facility] CATH LAB;  Service: Cardiovascular;  Laterality: N/A;   PACEMAKER INSERTION  1993   RIGHT/LEFT HEART CATH AND CORONARY ANGIOGRAPHY N/A 03/06/2018   Procedure: RIGHT/LEFT HEART  CATH AND CORONARY ANGIOGRAPHY;  Surgeon: Burnell Blanks, MD;  Location: [facility] CV LAB;  Service: Cardiovascular;  Laterality: N/A;   TONSILLECTOMY     adenoidectomy   TRANSCATHETER AORTIC VALVE REPLACEMENT, TRANSFEMORAL  04/09/2018   TRANSCATHETER AORTIC VALVE REPLACEMENT, TRANSFEMORAL Right 04/09/2018   Procedure: TRANSCATHETER AORTIC VALVE REPLACEMENT, TRANSFEMORAL. 65m Edwards Sapien 3 THV.;  Surgeon: MBurnell Blanks MD;  [place] [facility]  Service: Open Heart Surgery;  Laterality: Right;     A IV Location/Drains/Wounds Patient Lines/Drains/Airways Status     Active Line/Drains/Airways     Name Placement date Placement time Site Days   Peripheral IV 04/09/18 Left;Posterior Forearm 04/09/18  0815  Forearm  1520   Peripheral IV 10/05/21 20 G Left;Posterior Hand 10/05/21  1237  Hand  245   Peripheral IV 06/06/22 20 G Left;Upper Arm 06/06/22  1706  Arm  1   Incision (Closed) 04/09/18 Groin Left 04/09/18  1054  -- 1520   Incision (Closed) 04/09/18 Groin Right 04/09/18  1054  -- 1520            Intake/Output Last 24 hours No intake or output data in the 24 hours ending 06/07/22 0205  Labs/Imaging Results for orders placed or performed during the hospital encounter of 06/06/22 (from the past 48 hour(s))  CBC with Differential     Status: Abnormal   Collection Time: 06/06/22  5:04 PM  Result Value Ref Range   WBC 13.0 (H) 4.0 - 10.5 K/uL   RBC 4.37 4.22 - 5.81 MIL/uL   Hemoglobin 13.5 13.0 - 17.0 g/dL   HCT 42.7 39.0 - 52.0 %   MCV 97.7 80.0 - 100.0 fL   MCH 30.9 26.0 - 34.0 pg   MCHC 31.6 30.0 - 36.0 g/dL   RDW 15.5 11.5 - 15.5 %   Platelets 144 (L) 150 - 400 K/uL   nRBC 0.0 0.0 - 0.2 %   Neutrophils Relative % 77 %   Neutro Abs 10.1 (H) 1.7 - 7.7 K/uL   Lymphocytes Relative 12 %   Lymphs Abs 1.5 0.7 - 4.0 K/uL   Monocytes Relative 7 %   Monocytes Absolute 0.9 0.1 - 1.0 K/uL   Eosinophils Relative 2 %   Eosinophils Absolute 0.2 0.0 - 0.5 K/uL    Basophils Relative 1 %   Basophils Absolute 0.1 0.0 - 0.1 K/uL   Immature Granulocytes 1 %   Abs Immature Granulocytes 0.15 (H) 0.00 - 0.07 K/uL    Comment: Performed at [facility] [address], [place] [id number] Protime-INR     Status: Abnormal   Collection Time: 06/06/22  5:04 PM  Result Value Ref Range   Prothrombin Time 17.2 (H) 11.4 - 15.2 seconds   INR 1.4 (H) 0.8 - 1.2    Comment: (NOTE) INR goal varies based on device and disease states. Performed at [facility] [address], [place] [id number]  Type and screen [facility]    Status: None   Collection Time: 06/06/22  5:04 PM  Result Value Ref Range   ABO/RH(D) A POS    Antibody  Screen NEG    Sample Expiration      06/09/2022,2359 Performed at [facility], [place] [address] [place], [place] [address]   Basic metabolic panel     Status: Abnormal   Collection Time: 06/06/22  5:04 PM  Result Value Ref Range   Sodium 143 135 - 145 mmol/L   Potassium 5.0 3.5 - 5.1 mmol/L    Comment: HEMOLYSIS AT THIS LEVEL MAY AFFECT RESULT   Chloride 105 98 - 111 mmol/L   CO2 26 22 - 32 mmol/L   Glucose, Bld 138 (H) 70 - 99 mg/dL    Comment: Glucose reference range applies only to samples taken after fasting for at least 8 hours.   BUN 30 (H) 8 - 23 mg/dL   Creatinine, Ser 1.99 (H) 0.61 - 1.24 mg/dL   Calcium 9.0 8.9 - 10.3 mg/dL   GFR, Estimated 31 (L) >60 mL/min    Comment: (NOTE) Calculated using the CKD-EPI Creatinine Equation (2021)    Anion gap 12 5 - 15    Comment: Performed at [facility] [address], [place], [place] [address]  I-stat chem 8, ED     Status: Abnormal   Collection Time: 06/06/22  5:13 PM  Result Value Ref Range   Sodium 143 135 - 145 mmol/L   Potassium 4.9 3.5 - 5.1 mmol/L   Chloride 106 98 - 111 mmol/L   BUN 42 (H) 8 - 23 mg/dL   Creatinine, Ser 1.90 (H) 0.61 - 1.24 mg/dL   Glucose, Bld 137 (H) 70 - 99 mg/dL    Comment: Glucose  reference range applies only to samples taken after fasting for at least 8 hours.   Calcium, Ion 1.12 (L) 1.15 - 1.40 mmol/L   TCO2 29 22 - 32 mmol/L   Hemoglobin 14.3 13.0 - 17.0 g/dL   HCT 42.0 39.0 - 52.0 %  CBC     Status: Abnormal   Collection Time: 06/07/22 12:20 AM  Result Value Ref Range   WBC 17.7 (H) 4.0 - 10.5 K/uL   RBC 4.23 4.22 - 5.81 MIL/uL   Hemoglobin 13.3 13.0 - 17.0 g/dL   HCT 40.4 39.0 - 52.0 %   MCV 95.5 80.0 - 100.0 fL   MCH 31.4 26.0 - 34.0 pg   MCHC 32.9 30.0 - 36.0 g/dL   RDW 15.5 11.5 - 15.5 %   Platelets 124 (L) 150 - 400 K/uL   nRBC 0.0 0.0 - 0.2 %    Comment: Performed at [facility], [place] [address], [place],  [address]   CT Head Wo Contrast  Result Date: 06/06/2022 CLINICAL DATA:  Trauma fall on Eliquis EXAM: CT HEAD WITHOUT CONTRAST CT CERVICAL SPINE WITHOUT CONTRAST TECHNIQUE: Multidetector CT imaging of the head and cervical spine was performed following the standard protocol without intravenous contrast. Multiplanar CT image reconstructions of the cervical spine were also generated. RADIATION DOSE REDUCTION: This exam was performed according to the departmental dose-optimization program which includes automated exposure control, adjustment of the mA and/or kV according to patient size and/or use of iterative reconstruction technique. COMPARISON:  None Available. FINDINGS: CT HEAD FINDINGS Brain: No acute territorial infarction or hemorrhage is visualized. 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 Mild mass effect on adjacent temporal lobe. No edema. No midline shift. Moderate atrophy. Patchy white matter hypodensity consistent with chronic small vessel ischemic changes. Multiple  chronic appearing lacunar infarcts within the right thalamus and bilateral basal ganglia. Nonenlarged ventricles Vascular: No hyperdense vessels. Vertebral and carotid  vascular calcification Skull: Normal. Negative for fracture or focal lesion. Sinuses/Orbits: No acute finding. Other: None CT CERVICAL SPINE FINDINGS Alignment: Straightening of the cervical spine. No subluxation. Facet alignment is within normal limits Skull base and vertebrae: No acute fracture. No primary bone lesion or focal pathologic process. Soft tissues and spinal canal: No prevertebral fluid or swelling. No visible canal hematoma. Disc levels: Multilevel degenerative change. Mild disc space narrowing C2 through C5 with moderate severe disc space narrowing C5 through C7. Facet degenerative changes at multiple levels. Upper chest: Lung apices are clear. Partially visualized hematoma at the left base of neck and involving the strap muscles. Incompletely visualized displaced fracture involving the medial left clavicle. Other: None IMPRESSION: 1. No CT evidence for acute intracranial abnormality. Atrophy and chronic small vessel ischemic changes of the white matter. 2. 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 3. Straightening of the cervical spine with degenerative changes. No acute osseous abnormality. 4. Partially visualized displaced fracture involving the medial head of the left clavicle. Partially visualized hematoma at the left base of neck and involving the strap muscles. Electronically Signed   By: Donavan Foil M.D.   On: 06/06/2022 18:01   CT Cervical Spine Wo Contrast  Result Date: 06/06/2022 CLINICAL DATA:  Trauma fall on Eliquis EXAM: CT HEAD WITHOUT CONTRAST CT CERVICAL SPINE WITHOUT CONTRAST TECHNIQUE: Multidetector CT imaging of the head and cervical spine was performed following the standard protocol without intravenous contrast. Multiplanar CT image reconstructions of the cervical spine were also generated. RADIATION DOSE REDUCTION: This exam was performed according to the departmental dose-optimization  program which includes automated exposure control, adjustment of the mA and/or kV according to patient size and/or use of iterative reconstruction technique. COMPARISON:  None Available. FINDINGS: CT HEAD FINDINGS Brain: No acute territorial infarction or hemorrhage is visualized. 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 Mild mass effect on adjacent temporal lobe. No edema. No midline shift. Moderate atrophy. Patchy white matter hypodensity consistent with chronic small vessel ischemic changes. Multiple chronic appearing lacunar infarcts within the right thalamus and bilateral basal ganglia. Nonenlarged ventricles Vascular: No hyperdense vessels. Vertebral and carotid vascular calcification Skull: Normal. Negative for fracture or focal lesion. Sinuses/Orbits: No acute finding. Other: None CT CERVICAL SPINE FINDINGS Alignment: Straightening of the cervical spine. No subluxation. Facet alignment is within normal limits Skull base and vertebrae: No acute fracture. No primary bone lesion or focal pathologic process. Soft tissues and spinal canal: No prevertebral fluid or swelling. No visible canal hematoma. Disc levels: Multilevel degenerative change. Mild disc space narrowing C2 through C5 with moderate severe disc space narrowing C5 through C7. Facet degenerative changes at multiple levels. Upper chest: Lung apices are clear. Partially visualized hematoma at the left base of neck and involving the strap muscles. Incompletely visualized displaced fracture involving the medial left clavicle. Other: None IMPRESSION: 1. No CT evidence for acute intracranial abnormality. Atrophy and chronic small vessel ischemic changes of the white matter. 2. 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 3. Straightening of the cervical spine with  degenerative changes. No acute osseous abnormality. 4. Partially visualized displaced fracture involving the medial head of the left  clavicle. Partially visualized hematoma at the left base of neck and involving the strap muscles. Electronically Signed   By: Donavan Foil M.D.   On: 06/06/2022 18:01   CT Chest W Contrast  Result Date: 06/06/2022 CLINICAL DATA:  Chest trauma after fall; on Eliquis with hematoma over the proximal left clavicle EXAM: CT CHEST WITH CONTRAST TECHNIQUE: Multidetector CT imaging of the chest was performed during intravenous contrast administration. RADIATION DOSE REDUCTION: This exam was performed according to the departmental dose-optimization program which includes automated exposure control, adjustment of the mA and/or kV according to patient size and/or use of iterative reconstruction technique. CONTRAST:  72m OMNIPAQUE IOHEXOL 350 MG/ML SOLN COMPARISON:  Radiographs earlier today and CTA chest 03/21/2018 FINDINGS: Cardiovascular: Left chest wall CRT-P. TAVR. Coronary artery and aortic athero sclerotic calcification. No pericardial effusion. Mild cardiac enlargement. Mediastinum/Nodes: Unchanged 2.4 cm left thyroid nodule. This is stable from 2019 and should be benign. No follow-up recommended. No thoracic adenopathy by size. Unremarkable esophagus. Lungs/Pleura: No focal consolidation, pleural effusion, or pneumothorax. Mild centrilobular micro nodularity in the right lower lobe and lingula with mild bronchiolectasis. Upper Abdomen: Low-attenuation lesions in the kidneys are statistically likely to represent cysts. No follow-up is required. 5.6 cm cyst in the pancreatic tail unchanged from 2019. No acute abnormality. Musculoskeletal: Acute displaced fracture of the proximal left clavicle. The distal fragment is displaced anteriorly proximally 9 mm. Associated ill-defined hyperdense collection within the pectoralis muscles compatible with hematoma. No definite active bleeding  though evaluation is limited by streak artifact from adjacent pacemaker. Healed posttraumatic deformity right clavicle. IMPRESSION: 1. Acute displaced fracture of the proximal left clavicle with adjacent intramuscular hematoma. No definite active bleeding though evaluation is limited by streak artifact from adjacent pacemaker. 2. Mild small airway infection/inflammation. 3. Coronary artery and Aortic Atherosclerosis (ICD10-I70.0). Electronically Signed   By: TPlacido SouM.D.   On: 06/06/2022 17:58   DG Chest Port 1 View  Result Date: 06/06/2022 CLINICAL DATA:  Fall on blood thinners EXAM: PORTABLE CHEST 1 VIEW COMPARISON:  Radiographs 04/09/2018 FINDINGS: Stable cardiomegaly. Aortic atherosclerotic calcification. TAVR. Left chest wall CRT-P. No focal consolidation, pleural effusion, or pneumothorax. No acute osseous abnormality. Remote left rib fractures. IMPRESSION: No acute abnormality.  Cardiomegaly. Electronically Signed   By: TPlacido SouM.D.   On: 06/06/2022 17:29    Pending Labs Unresulted Labs (From admission, onward)     Start     Ordered   06/07/22 02094 Basic metabolic panel  Tomorrow morning,   R        06/06/22 2142   06/07/22 0500  Protime-INR  Tomorrow morning,   R        06/06/22 2142            Vitals/Pain Today's Vitals   06/07/22 0000 06/07/22 0100 06/07/22 0130 06/07/22 0200  BP: 129/74 120/73 114/66 106/68  Pulse: 70 72 69 72  Resp: (!) 22 15 (!) 21 (!) 23  Temp:      TempSrc:      SpO2: 97% 97% 97% 95%  Weight:      Height:      PainSc:        Isolation Precautions No active isolations  Medications Medications  furosemide (LASIX) tablet 20 mg (20 mg Oral Given 06/06/22 2207)  carbidopa-levodopa (SINEMET IR) 25-100 MG per tablet immediate release 1 tablet (1 tablet Oral Given 06/06/22 2207)  mupirocin ointment (BACTROBAN) 2 % 1 Application (has no administration in time range)  sodium  chloride flush (NS) 0.9 % injection 3 mL (3 mLs Intravenous  Given 06/06/22 2207)  sodium chloride flush (NS) 0.9 % injection 3 mL (has no administration in time range)  0.9 %  sodium chloride infusion (has no administration in time range)  acetaminophen (TYLENOL) tablet 650 mg (650 mg Oral Given 06/06/22 2303)  methocarbamol (ROBAXIN) tablet 500 mg (500 mg Oral Given 06/06/22 2208)  traMADol (ULTRAM) tablet 50 mg (has no administration in time range)  morphine (PF) 4 MG/ML injection 4 mg (has no administration in time range)  ondansetron (ZOFRAN-ODT) disintegrating tablet 4 mg (has no administration in time range)    Or  ondansetron (ZOFRAN) injection 4 mg (has no administration in time range)  insulin aspart (novoLOG) injection 0-15 Units (has no administration in time range)  lisinopril (ZESTRIL) tablet 5 mg (has no administration in time range)  albuterol (PROVENTIL) (2.5 MG/3ML) 0.083% nebulizer solution 2.5 mg (has no administration in time range)  metoprolol succinate (TOPROL-XL) 24 hr tablet 25 mg (has no administration in time range)  iohexol (OMNIPAQUE) 350 MG/ML injection 50 mL (50 mLs Intravenous Contrast Given 06/06/22 1744)    Mobility non-ambulatory Low fall risk   Focused Assessments Neuro Assessment Handoff:  Swallow screen pass? Yes          Neuro Assessment:   Neuro Checks:      Last Documented NIHSS Modified Score:   Has TPA been given? No If patient is a Neuro Trauma and patient is going to OR before floor call report to [place] nurse: [phone] or [phone]   R Recommendations: See Admitting Provider Note  Report given to:   Additional Notes: pt is on RA. Pt has on male [place].

## 2022-06-07 NOTE — Evaluation (Signed)
Occupational Therapy Evaluation Patient Details Name: [name] [name] MRN: [id number] DOB: 1926-08-30 Today's Date: 06/07/2022   History of Present Illness Pt is a 88 yo male admitted after a fall at home resulting in L clavicle fx with hematoma. Pt also noted to have R eye irritation over last week for which he gets drops. 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 Ortho has been cousulted for clavicle fx. Pt in sling. PMH:CHF, pacemaker, Afib, HTN, Parkinsons, s/p TAVR 2019.   Clinical Impression   Pt admitted with the above diagnosis and has the deficits outlined below. Pt would benefit from cont OT to increase independence with basic adls and adl transfers so pt can attempt to reach his baseline level of care. Baseline was pt living alone in home with [facility] to assist with bathing once a week and HHOT/HHPT.  Pt's family assists with all meals and home management/driving. Pt was set to go to assisted living on 06/15/22 until this fall.  Need to see if ALF facility can take pt at his current level.  If not, feel pt may need SNF rehab to get him to that level since family cannot be with pt 24/7.  Pt has history of several falls these past few months, has significant macular degeneration, has life alert that he does not always use and will need 24/7 care after this fall.  Waiting to hear from ortho about LUE weight bearing status in regards to using a walker. Will continue to see with focus on balance during adls.      Recommendations for follow up therapy are one component of a multi-disciplinary discharge planning process, led by the attending physician.  Recommendations may be updated based on patient status, additional functional criteria and insurance authorization.   Follow Up Recommendations  Skilled nursing-short term rehab (<3 hours/day)     Assistance Recommended at Discharge Frequent or constant Supervision/Assistance  Patient can return home with the following A little help with  walking and/or transfers;A little help with bathing/dressing/bathroom;Assistance with cooking/housework;Direct supervision/assist for financial management;Assist for transportation;Help with stairs or ramp for entrance    Functional Status Assessment  Patient has had a recent decline in their functional status and demonstrates the ability to make significant improvements in function in a reasonable and predictable amount of time.  Equipment Recommendations  None recommended by OT    Recommendations for Other Services       Precautions / Restrictions Precautions Precautions: Fall Precaution Comments: Pt currently NWB LUE due to clavicle fx. Trauma PT reached out to ortho to see if he may be able to weight bear to use a walker. Required Braces or Orthoses: Sling (chart states for comfort but if NWB would recommend when up pt keeps it on to remind him he is NWB.) Restrictions Weight Bearing Restrictions: Yes LUE Weight Bearing: Non weight bearing Other Position/Activity Restrictions: waiting to get confirmation to see if this could change to WBAT      Mobility Bed Mobility Overal bed mobility: Needs Assistance Bed Mobility: Supine to Sit     Supine to sit: Min assist, HOB elevated     General bed mobility comments: assist to get into full sitting an cues to move foward on bed to stablize feet on floor.    Transfers Overall transfer level: Needs assistance Equipment used: 1 person hand held assist Transfers: Sit to/from Stand, Bed to chair/wheelchair/BSC Sit to Stand: Min assist Stand pivot transfers: Mod assist  General transfer comment: Pt with significant posterior lean when up.      Balance Overall balance assessment: Needs assistance Sitting-balance support: Feet supported Sitting balance-Leahy Scale: Fair     Standing balance support: During functional activity, Single extremity supported Standing balance-Leahy Scale: Poor Standing balance comment:  reliant on outside support                           ADL either performed or assessed with clinical judgement   ADL Overall ADL's : Needs assistance/impaired Eating/Feeding: Set up;Sitting   Grooming: Wash/dry hands;Wash/dry face;Oral care;Applying deodorant;Minimal assistance;Sitting   Upper Body Bathing: Minimal assistance;Sitting   Lower Body Bathing: Moderate assistance;Sit to/from stand;Cueing for compensatory techniques   Upper Body Dressing : Maximal assistance;Sitting   Lower Body Dressing: Sit to/from stand;Cueing for compensatory techniques;Maximal assistance   Toilet Transfer: Moderate assistance;Stand-pivot;BSC/3in1 Toilet Transfer Details (indicate cue type and reason): pt with posterior lean Toileting- Clothing Manipulation and Hygiene: Moderate assistance;Sit to/from stand;Cueing for compensatory techniques       Functional mobility during ADLs: Moderate assistance General ADL Comments: Pt limited by arm in sling due to clavicle fx. Trauma PA reached out to ortho to get confirmation on WB status of LUE. Pt with posterior lean during all mobility posing a fall risk.  Feel pt was fall risk PTA and now bigger fall risk.     Vision Baseline Vision/History: 6 Macular Degeneration Ability to See in Adequate Light: 1 Impaired Patient Visual Report: No change from baseline Vision Assessment?: Yes Eye Alignment: Within Functional Limits Ocular Range of Motion: Within Functional Limits Alignment/Gaze Preference: Within Defined Limits Tracking/Visual Pursuits: Able to track stimulus in all quads without difficulty Convergence: Within functional limits Visual Fields: No apparent deficits Additional Comments: Pt with significant macular degeneration. Wears glasses for distance that are at home.     Perception     Praxis Praxis Praxis tested?: Within functional limits    Pertinent Vitals/Pain Pain Assessment Pain Assessment: No/denies pain     Hand  Dominance Right   Extremity/Trunk Assessment Upper Extremity Assessment Upper Extremity Assessment: LUE deficits/detail LUE Deficits / Details: Waiting on ortho to weight in on weight bearing clearance and how to progerss with exercise and mobiltiy. LUE: Unable to fully assess due to immobilization LUE Sensation: WNL LUE Coordination: decreased gross motor   Lower Extremity Assessment Lower Extremity Assessment: Defer to PT evaluation   Cervical / Trunk Assessment Cervical / Trunk Assessment: Kyphotic   Communication Communication Communication: HOH   Cognition Arousal/Alertness: Awake/alert Behavior During Therapy: WFL for tasks assessed/performed Overall Cognitive Status: History of cognitive impairments - at baseline                                 General Comments: Pt not oriented to month (sister in law states this is not unusual), otherwise was oriented, answered safety questions and motivated. Pt with decresaed insight into balance deficits.     General Comments  Pt limited with adls due to clavicle fx and balance deficits.  Pt lives at home alone and falls on average once a month.    Exercises     Shoulder Instructions      Home Living Family/patient expects to be discharged to:: Private residence Living Arrangements: Alone Available Help at Discharge: Family;Available PRN/intermittently Type of Home: House Home Access: Level entry     Home Layout: One level  Bathroom Shower/Tub: Corporate investment banker: Standard     Home Equipment: Conservation officer, nature (2 wheels);Cane - single point;Tub bench;BSC/3in1   Additional Comments: Pt's family checks in numous times a day and pt has HHPT and HH aid to assist with showering one time each week.  Pt was set to go to assited living facilty on Dec 7th.  WIll need to determine if pt can still go to ALF at current level.      Prior Functioning/Environment Prior Level of Function : History  of Falls (last six months);Needs assist             Mobility Comments: waling with cane but doesn't use at all times. PT was recommending walker but pt prefers cane. ADLs Comments: HHAid is there to assist pt bathing in shower once a week. Pt can dress self and toilet on own.  Family assists with all meals and home management.        OT Problem List: Decreased range of motion;Impaired balance (sitting and/or standing);Impaired vision/perception;Decreased cognition;Decreased knowledge of use of DME or AE;Decreased safety awareness;Impaired UE functional use      OT Treatment/Interventions: Self-care/ADL training;Therapeutic activities;Balance training    OT Goals(Current goals can be found in the care plan section) Acute Rehab OT Goals Patient Stated Goal: to get stronger OT Goal Formulation: With patient/family Time For Goal Achievement: 06/20/22 Potential to Achieve Goals: Good ADL Goals Pt Will Perform Grooming: with min guard assist;standing Pt Will Perform Upper Body Dressing: with min assist;sitting Pt Will Perform Lower Body Dressing: with min assist;sit to/from stand Pt Will Transfer to Toilet: with min guard assist;ambulating Pt Will Perform Toileting - Clothing Manipulation and hygiene: with min guard assist;sit to/from stand Additional ADL Goal #1: Will progress LUE rehab as ordered by orthopedics.  OT Frequency: Min 2X/week    Co-evaluation              AM-PAC OT "6 Clicks" Daily Activity     Outcome Measure Help from another person eating meals?: A Little Help from another person taking care of personal grooming?: A Little Help from another person toileting, which includes using toliet, bedpan, or urinal?: A Lot Help from another person bathing (including washing, rinsing, drying)?: A Lot Help from another person to put on and taking off regular upper body clothing?: A Lot Help from another person to put on and taking off regular lower body clothing?: A  Lot 6 Click Score: 14   End of Session Nurse Communication: Mobility status;Weight bearing status;Other (comment) (getting clarification for LUE WB status)  Activity Tolerance: Patient tolerated treatment well Patient left: in chair;with call bell/phone within reach;with chair alarm set;with family/visitor present  OT Visit Diagnosis: Unsteadiness on feet (R26.81);Other abnormalities of gait and mobility (R26.89);Repeated falls (R29.6)                Time: 1224-8250 OT Time Calculation (min): 34 min Charges:  OT General Charges $OT Visit: 1 Visit OT Evaluation $OT Eval Moderate Complexity: 1 Mod OT Treatments $Self Care/Home Management : 8-22 mins  Glenford Peers 06/07/2022, 10:56 AM

## 2022-06-07 NOTE — Progress Notes (Signed)
Mobility Specialist Progress Note   06/07/22 1457  Mobility  Activity Transferred from chair to bed  Level of Assistance Minimal assist, patient does 75% or more  Assistive Device Other (Comment) (HHA)  Distance Ambulated (ft) 2 ft  LUE Weight Bearing NWB  Activity Response Tolerated well  $Mobility charge 1 Mobility   Responded to a chair alarm, upon entry grandson of pt was trying to get pt back to bed. Intervened and safely got pt back to bed w/o fault while adhering to NWB status. No complaints of pain, call bell in reach and bed alarm on   [place] Specialist Please contact via [place] or  Rehab office at [phone]

## 2022-06-07 NOTE — ED Notes (Signed)
Called [facility] and requested purpleman x2.

## 2022-06-08 LAB — BASIC METABOLIC PANEL
Anion gap: 9 (ref 5–15)
BUN: 44 mg/dL — ABNORMAL HIGH (ref 8–23)
CO2: 25 mmol/L (ref 22–32)
Calcium: 8.3 mg/dL — ABNORMAL LOW (ref 8.9–10.3)
Chloride: 109 mmol/L (ref 98–111)
Creatinine, Ser: 2.2 mg/dL — ABNORMAL HIGH (ref 0.61–1.24)
GFR, Estimated: 27 mL/min — ABNORMAL LOW (ref >60)
Glucose, Bld: 105 mg/dL — ABNORMAL HIGH (ref 70–99)
Potassium: 4.1 mmol/L (ref 3.5–5.1)
Sodium: 143 mmol/L (ref 135–145)

## 2022-06-08 LAB — GLUCOSE, CAPILLARY
Glucose-Capillary: 81 mg/dL (ref 70–99)
Glucose-Capillary: 89 mg/dL (ref 70–99)
Glucose-Capillary: 105 mg/dL — ABNORMAL HIGH (ref 70–99)
Glucose-Capillary: 118 mg/dL — ABNORMAL HIGH (ref 70–99)

## 2022-06-08 LAB — CBC
HCT: 32.8 % — ABNORMAL LOW (ref 39.0–52.0)
Hemoglobin: 10.5 g/dL — ABNORMAL LOW (ref 13.0–17.0)
MCH: 30.7 pg (ref 26.0–34.0)
MCHC: 32 g/dL (ref 30.0–36.0)
MCV: 95.9 fL (ref 80.0–100.0)
Platelets: 112 10*3/uL — ABNORMAL LOW (ref 150–400)
RBC: 3.42 MIL/uL — ABNORMAL LOW (ref 4.22–5.81)
RDW: 15.9 % — ABNORMAL HIGH (ref 11.5–15.5)
WBC: 11.6 10*3/uL — ABNORMAL HIGH (ref 4.0–10.5)
nRBC: 0 % (ref 0.0–0.2)

## 2022-06-08 MED ORDER — SODIUM CHLORIDE 0.9 % IV SOLN
250.0000 mL | INTRAVENOUS | Status: DC | PRN
  Administered 2022-06-08: 250 mL via INTRAVENOUS

## 2022-06-08 NOTE — Progress Notes (Addendum)
Mobility Specialist Progress Note   06/08/22 1500  Orthostatic Lying   BP- Lying 128/72  Orthostatic Sitting  BP- Sitting 110/76  Orthostatic Standing at 0 minutes  BP- Standing at 0 minutes 114/62  Mobility  Activity Ambulated with assistance in hallway  Level of Assistance Minimal assist, patient does 75% or more  Assistive Device Front wheel walker  Distance Ambulated (ft) 144 ft  LUE Weight Bearing WBAT  Activity Response Tolerated well  $Mobility charge 1 Mobility   Pre Mobility: 74 HR, 128/72 BP, 96% SpO2 on RA During Mobility: 98 HR, 114/62 BP, 91% SpO2 on RA Post Mobility: 70 HR, 135/74 BP, 99% SpO2 on RA  Received in bed having no complaints and agreeable. MinA to get EOB d/t trunk weakness and MinA during initial stand d/t LOB. Pt requiring cues on proximity of DME but no incidents occurred. Pt floating at ~88% - 93% SpO2 throughout ambulation having no complaints, returned back to the chair w/ call bell in reach and chair alarm on.   [place] Falling Mobility Specialist Please contact via [employer] or  Rehab office at [phone]

## 2022-06-08 NOTE — Progress Notes (Signed)
[facility] Progress Note     Subjective: Resting this AM, denies pain. He is able is move left shoulder. Working on SNF placement. BP soft.   Objective: Vital signs in last 24 hours: Temp:  [97.3 F (36.3 C)-98.1 F (36.7 C)] 97.5 F (36.4 C) (11/30 0730) Pulse Rate:  [69-71] 69 (11/30 0730) Resp:  [12-18] 12 (11/30 0730) BP: (82-113)/(57-70) 113/70 (11/30 0730) SpO2:  [93 %-100 %] 95 % (11/30 0730)    Intake/Output from previous day: No intake/output data recorded. Intake/Output this shift: No intake/output data recorded.  PE: Gen:  Alert, NAD, pleasant HEENT: EOM's intact, pupils equal and round, right subconjunctival hemorrhage  Card:  RRR, palpable pedal and radial pulses Pulm:  CTAB, no W/R/R, rate and effort normal on room air Abd: Soft, NT/ND Ext:  calves soft and nontender. LUE in sling, NVI, lower left neck/ anterior upper left shoulder hematoma present Psych: A&Ox4 Skin: no rashes noted, warm and dry  Lab Results:  Recent Labs    06/07/22 0020 06/08/22 0536  WBC 17.7* 11.6*  HGB 13.3 10.5*  HCT 40.4 32.8*  PLT 124* 112*    BMET Recent Labs    06/07/22 0020 06/08/22 0536  NA 138 143  K 4.7 4.1  CL 107 109  CO2 23 25  GLUCOSE 118* 105*  BUN 28* 44*  CREATININE 1.86* 2.20*  CALCIUM 8.4* 8.3*    PT/INR Recent Labs    06/06/22 1704 06/07/22 0020  LABPROT 17.2* 17.6*  INR 1.4* 1.5*    CMP     Component Value Date/Time   NA 143 06/08/2022 0536   NA 146 (H) 09/29/2021 1108   K 4.1 06/08/2022 0536   CL 109 06/08/2022 0536   CO2 25 06/08/2022 0536   GLUCOSE 105 (H) 06/08/2022 0536   BUN 44 (H) 06/08/2022 0536   BUN 28 09/29/2021 1108   CREATININE 2.20 (H) 06/08/2022 0536   CALCIUM 8.3 (L) 06/08/2022 0536   PROT 7.0 04/05/2018 1042   ALBUMIN 3.7 04/05/2018 1042   AST 27 04/05/2018 1042   ALT <5 04/05/2018 1042   ALKPHOS 49 04/05/2018 1042   BILITOT 1.4 (H) 04/05/2018 1042   GFRNONAA 27 (L) 06/08/2022 0536   GFRAA 47  (L) 04/22/2018 1516   Lipase  No results found for: "LIPASE"     Studies/Results: DG Clavicle Left  Result Date: 06/07/2022 CLINICAL DATA:  Fracture of unspecified part of left clavicle, initial encounter for closed fracture EXAM: LEFT CLAVICLE - 2+ VIEWS COMPARISON:  Radiograph 06/06/2022 FINDINGS: Displaced left medial clavicle fracture as seen on recent CT. There is a chronic right clavicle fracture. There is chronic widening of the left AC joint with small ossified joint body. IMPRESSION: Displaced left medial clavicle fracture as seen on recent CT. Electronically Signed   By: Maurine Simmering M.D.   On: 06/07/2022 10:25   CUP PACEART REMOTE DEVICE CHECK  Result Date: 06/07/2022 Scheduled remote reviewed. Normal device function.  Permanent AF, Warfarin Corvue below threshold x 10 days Next remote 91 days. LA  CT Head Wo Contrast  Result Date: 06/06/2022 CLINICAL DATA:  Trauma fall on Eliquis EXAM: CT HEAD WITHOUT CONTRAST CT CERVICAL SPINE WITHOUT CONTRAST TECHNIQUE: Multidetector CT imaging of the head and cervical spine was performed following the standard protocol without intravenous contrast. Multiplanar CT image reconstructions of the cervical spine were also generated. RADIATION DOSE REDUCTION: This exam was performed according to the departmental dose-optimization program which includes automated exposure control, adjustment of  the mA and/or kV according to patient size and/or use of iterative reconstruction technique. COMPARISON:  None Available. FINDINGS: CT HEAD FINDINGS Brain: No acute territorial infarction or hemorrhage is visualized. 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 Mild mass effect on adjacent temporal lobe. No edema. No midline shift. Moderate atrophy. Patchy white matter hypodensity consistent with chronic small vessel ischemic changes. Multiple chronic appearing  lacunar infarcts within the right thalamus and bilateral basal ganglia. Nonenlarged ventricles Vascular: No hyperdense vessels. Vertebral and carotid vascular calcification Skull: Normal. Negative for fracture or focal lesion. Sinuses/Orbits: No acute finding. Other: None CT CERVICAL SPINE FINDINGS Alignment: Straightening of the cervical spine. No subluxation. Facet alignment is within normal limits Skull base and vertebrae: No acute fracture. No primary bone lesion or focal pathologic process. Soft tissues and spinal canal: No prevertebral fluid or swelling. No visible canal hematoma. Disc levels: Multilevel degenerative change. Mild disc space narrowing C2 through C5 with moderate severe disc space narrowing C5 through C7. Facet degenerative changes at multiple levels. Upper chest: Lung apices are clear. Partially visualized hematoma at the left base of neck and involving the strap muscles. Incompletely visualized displaced fracture involving the medial left clavicle. Other: None IMPRESSION: 1. No CT evidence for acute intracranial abnormality. Atrophy and chronic small vessel ischemic changes of the white matter. 2. 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 3. Straightening of the cervical spine with degenerative changes. No acute osseous abnormality. 4. Partially visualized displaced fracture involving the medial head of the left clavicle. Partially visualized hematoma at the left base of neck and involving the strap muscles. Electronically Signed   By: Donavan Foil M.D.   On: 06/06/2022 18:01   CT Cervical Spine Wo Contrast  Result Date: 06/06/2022 CLINICAL DATA:  Trauma fall on Eliquis EXAM: CT HEAD WITHOUT CONTRAST CT CERVICAL SPINE WITHOUT CONTRAST TECHNIQUE: Multidetector CT imaging of the head and cervical spine was performed following the standard protocol without intravenous contrast. Multiplanar CT image  reconstructions of the cervical spine were also generated. RADIATION DOSE REDUCTION: This exam was performed according to the departmental dose-optimization program which includes automated exposure control, adjustment of the mA and/or kV according to patient size and/or use of iterative reconstruction technique. COMPARISON:  None Available. FINDINGS: CT HEAD FINDINGS Brain: No acute territorial infarction or hemorrhage is visualized. 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 Mild mass effect on adjacent temporal lobe. No edema. No midline shift. Moderate atrophy. Patchy white matter hypodensity consistent with chronic small vessel ischemic changes. Multiple chronic appearing lacunar infarcts within the right thalamus and bilateral basal ganglia. Nonenlarged ventricles Vascular: No hyperdense vessels. Vertebral and carotid vascular calcification Skull: Normal. Negative for fracture or focal lesion. Sinuses/Orbits: No acute finding. Other: None CT CERVICAL SPINE FINDINGS Alignment: Straightening of the cervical spine. No subluxation. Facet alignment is within normal limits Skull base and vertebrae: No acute fracture. No primary bone lesion or focal pathologic process. Soft tissues and spinal canal: No prevertebral fluid or swelling. No visible canal hematoma. Disc levels: Multilevel degenerative change. Mild disc space narrowing C2 through C5 with moderate severe disc space narrowing C5 through C7. Facet degenerative changes at multiple levels. Upper chest: Lung apices are clear. Partially visualized hematoma at the left base of neck and involving the  strap muscles. Incompletely visualized displaced fracture involving the medial left clavicle. Other: None IMPRESSION: 1. No CT evidence for acute intracranial abnormality. Atrophy and chronic small vessel ischemic changes of the white matter. 2. 2.4 cm densely calcified  extra-axial mass at the right middle cranial fossa, most likely representing sphenoid wing meningioma. 3. Straightening of the cervical spine with degenerative changes. No acute osseous abnormality. 4. Partially visualized displaced fracture involving the medial head of the left clavicle. Partially visualized hematoma at the left base of neck and involving the strap muscles. Electronically Signed   By: Donavan Foil M.D.   On: 06/06/2022 18:01   CT Chest W Contrast  Result Date: 06/06/2022 CLINICAL DATA:  Chest trauma after fall; on Eliquis with hematoma over the proximal left clavicle EXAM: CT CHEST WITH CONTRAST TECHNIQUE: Multidetector CT imaging of the chest was performed during intravenous contrast administration. RADIATION DOSE REDUCTION: This exam was performed according to the departmental dose-optimization program which includes automated exposure control, adjustment of the mA and/or kV according to patient size and/or use of iterative reconstruction technique. CONTRAST:  70m OMNIPAQUE IOHEXOL 350 MG/ML SOLN COMPARISON:  Radiographs earlier today and CTA chest 03/21/2018 FINDINGS: Cardiovascular: Left chest wall CRT-P. TAVR. Coronary artery and aortic athero sclerotic calcification. No pericardial effusion. Mild cardiac enlargement. Mediastinum/Nodes: Unchanged 2.4 cm left thyroid nodule. This is stable from 2019 and should be benign. No follow-up recommended. No thoracic adenopathy by size. Unremarkable esophagus. Lungs/Pleura: No focal consolidation, pleural effusion, or pneumothorax. Mild centrilobular micro nodularity in the right lower lobe and lingula with mild bronchiolectasis. Upper Abdomen: Low-attenuation lesions in the kidneys are statistically likely to represent cysts. No follow-up is required. 5.6 cm cyst in the pancreatic tail unchanged from 2019. No acute abnormality. Musculoskeletal: Acute displaced fracture of the proximal left clavicle. The distal fragment is displaced anteriorly  proximally 9 mm. Associated ill-defined hyperdense collection within the pectoralis muscles compatible with hematoma. No definite active bleeding though evaluation is limited by streak artifact from adjacent pacemaker. Healed posttraumatic deformity right clavicle. IMPRESSION: 1. Acute displaced fracture of the proximal left clavicle with adjacent intramuscular hematoma. No definite active bleeding though evaluation is limited by streak artifact from adjacent pacemaker. 2. Mild small airway infection/inflammation. 3. Coronary artery and Aortic Atherosclerosis (ICD10-I70.0). Electronically Signed   By: TPlacido SouM.D.   On: 06/06/2022 17:58   DG Chest Port 1 View  Result Date: 06/06/2022 CLINICAL DATA:  Fall on blood thinners EXAM: PORTABLE CHEST 1 VIEW COMPARISON:  Radiographs 04/09/2018 FINDINGS: Stable cardiomegaly. Aortic atherosclerotic calcification. TAVR. Left chest wall CRT-P. No focal consolidation, pleural effusion, or pneumothorax. No acute osseous abnormality. Remote left rib fractures. IMPRESSION: No acute abnormality.  Cardiomegaly. Electronically Signed   By: TPlacido SouM.D.   On: 06/06/2022 17:29    Anti-infectives: Anti-infectives (From admission, onward)    None        Assessment/Plan Multiple recent falls  Left clavicle fracture with large hematoma - hold eliquis - discussed with family that we recommend holding this indefinitely given his multiple recent falls. Was reviewed by TCTS and there was no active bleeding or vascular injury seen. 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 SNF recommended, medically stable for discharge in  the next 24-48 hrs     LOS: 2 days    Norm Parcel, [facility] Surgery 06/08/2022,  10:38 AM Please see Amion for pager number during day hours 7:00am-4:30pm

## 2022-06-08 NOTE — TOC Initial Note (Signed)
Transition of Care [facility]) - Initial/Assessment Note    Patient Details  Name: [name] [name] MRN: [id number] Date of Birth: January 04, 1927  Transition [facility]) CM/SW Contact:    Ella Bodo, RN Phone Number: 06/08/2022, 10:15am  Clinical Narrative:                 Pt is a 88 yo male admitted after a fall at home resulting in L clavicle fx with hematoma. Pt also noted to have R eye irritation over last week for which he gets drops. 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 Ortho has been cousulted for clavicle fx.  PTA, pt required assistance with ADLS, and has [facility] aide/family members to assist.  Met with patient's daughter-in-law, [name] [name], to discuss discharge planning.  Patient/family prefer SNF placement in the [place] area, preferably [facility].  Patient has plans to move into [facility] assisted living facility in [place] in December.  PT/OT recommending skilled nursing facility placement, and family agreeable.  Will initiate FL 2 and fax out for SNF bed search.  Expected Discharge Plan: Skilled Nursing Facility Barriers to Discharge: Continued Medical Work up        Expected Discharge Plan and Services Expected Discharge Plan: [place]   Discharge Planning Services: CM Consult Post Acute Care Choice: [place] Living arrangements for the past 2 months: Single Family Home                                      Prior Living Arrangements/Services Living arrangements for the past 2 months: Single Family Home Lives with:: Self Patient language and need for interpreter reviewed:: Yes Do you feel safe going back to the place where you live?: No      Need for Family Participation in Patient Care: Yes (Comment) Care giver support system in place?: Yes (comment) Current home services: DME Criminal Activity/Legal Involvement Pertinent to Current Situation/Hospitalization: No - Comment as needed  Activities of Daily  Living Home Assistive Devices/Equipment: None ADL Screening (condition at time of admission) Patient's cognitive ability adequate to safely complete daily activities?: Yes Is the patient deaf or have difficulty hearing?: No Does the patient have difficulty seeing, even when wearing glasses/contacts?: No Does the patient have difficulty concentrating, remembering, or making decisions?: No Patient able to express need for assistance with ADLs?: Yes Does the patient have difficulty dressing or bathing?: No Independently performs ADLs?: Yes (appropriate for developmental age) Does the patient have difficulty walking or climbing stairs?: No Weakness of Legs: None Weakness of Arms/Hands: None  Permission Sought/Granted Permission sought to share information with : Family Supports    Share Information with NAME: Dawood Spitler     Permission granted to share info w Relationship: son  Permission granted to share info w Contact Information: [phone]  Emotional Assessment Appearance:: Appears stated age Attitude/Demeanor/Rapport: Engaged Affect (typically observed): Accepting Orientation: : Oriented to Self, Oriented to Place, Oriented to  Time, Oriented to Situation      Admission diagnosis:  Chest wall hematoma [S20.219A] Fall, initial encounter [W19.XXXA] Traumatic closed fx of proximal clavicle with minimal displacement, left, initial encounter [S42.012A] Patient Active Problem List   Diagnosis Date Noted   Chest wall hematoma 06/06/2022   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   Tremors of nervous system    SSS (sick sinus syndrome) (HCC)    Severe aortic stenosis    Presence  of permanent cardiac pacemaker    CHF (congestive heart failure) ([place])    Hypertension    Cancer (HCC)    Atrial fibrillation ([place])    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 [place] [address] Phone: [phone] Fax: [phone]     Social Determinants of Health (SDOH) Interventions    Readmission Risk Interventions     No data to display         Reinaldo Raddle, RN, BSN  Trauma/Neuro ICU Case Manager [phone]

## 2022-06-08 NOTE — NC FL2 (Signed)
[place] LEVEL OF CARE FORM     IDENTIFICATION  Patient Name: [name] [name] Birthdate: [birth year] Sex: male Admission Date (Current Location): 06/06/2022  [facility] and [place] Number:  Herbalist and Address:  [facility] [place], [place] 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   Tremors of nervous system    SSS (sick sinus syndrome) (HCC)    Severe aortic stenosis    Presence of permanent cardiac pacemaker    CHF (congestive heart failure) ([place])    Hypertension    Cancer ([place])    Atrial fibrillation ([place])    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   Cardiomyopathy ([employer]) 07/07/2013   Parkinson's disease ([place]) 07/07/2013   Permanent atrial fibrillation- AVN RFA 07/05/13 06/29/2013   Acute on chronic combined systolic and diastolic CHF (congestive heart failure) ([place]) 06/29/2013   Biventricular cardiac pacemaker upgrade 07/05/13 (St Jude) 06/29/2013   Critical aortic valve stenosis 06/29/2013   Current use of long term anticoagulation 06/29/2013   Diabetes mellitus without complication (HCC)     Orientation RESPIRATION BLADDER Height & Weight     Self, Time, Situation, Place  Normal (room air) Continent Weight: 68 kg Height:  '5\' 10"'$  (177.8 cm)  BEHAVIORAL SYMPTOMS/MOOD NEUROLOGICAL BOWEL NUTRITION STATUS      Continent Diet (carb modified thin liquids)  AMBULATORY STATUS COMMUNICATION OF NEEDS Skin   Limited Assist Verbally Skin abrasions, Bruising  (clavicle, bilateral groins)                       Personal Care Assistance Level of Assistance  Bathing, Feeding, Dressing Bathing Assistance: Limited assistance Feeding assistance: Limited assistance Dressing Assistance: Limited assistance     Functional Limitations Info             SPECIAL CARE FACTORS FREQUENCY  PT (By licensed PT), OT (By licensed OT)     PT Frequency: 5x weekly OT Frequency: 5x weekly            Contractures Contractures Info: Not present    Additional Factors Info  Code Status, Allergies Code Status Info: Full Allergies Info: Amiodarone induced hepatitis           Current Medications (06/08/2022):  This is the current hospital active medication list Current Facility-Administered Medications  Medication Dose Route Frequency Provider Last Rate Last Admin   0.9 %  sodium chloride infusion  250 mL Intravenous PRN Norm Parcel, PA-C 50 mL/hr at 06/08/22 0939 250 mL at 06/08/22 0939   acetaminophen (TYLENOL) tablet 650 mg  650 mg Oral Q6H Georganna Skeans, MD   650 mg at 06/08/22 1236   albuterol (PROVENTIL) (2.5 MG/3ML) 0.083% nebulizer solution 2.5 mg  2.5 mg Nebulization Q4H PRN Georganna Skeans, MD       carbidopa-levodopa (SINEMET IR) 25-100 MG per tablet immediate release 1 tablet  1 tablet Oral TID Georganna Skeans, MD   1 tablet at 06/08/22 0939   furosemide (LASIX)  tablet 20 mg  20 mg Oral Daily Georganna Skeans, MD   20 mg at 06/08/22 1236   insulin aspart (novoLOG) injection 0-15 Units  0-15 Units Subcutaneous TID WC Georganna Skeans, MD       lisinopril (ZESTRIL) tablet 5 mg  5 mg Oral Daily Georganna Skeans, MD       methocarbamol (ROBAXIN) tablet 500 mg  500 mg Oral TID Georganna Skeans, MD   500 mg at 06/08/22 9326   metoprolol succinate (TOPROL-XL) 24 hr tablet 25 mg  25 mg Oral Daily Georganna Skeans, MD       morphine (PF) 4 MG/ML injection 4 mg  4 mg Intravenous Q4H PRN Meuth, Brooke A, PA-C       mupirocin ointment (BACTROBAN)  2 % 1 Application  1 Application Topical Daily PRN Georganna Skeans, MD       ondansetron (ZOFRAN-ODT) disintegrating tablet 4 mg  4 mg Oral Q6H PRN Georganna Skeans, MD       Or   ondansetron [facility]) injection 4 mg  4 mg Intravenous Q6H PRN Georganna Skeans, MD   4 mg at 06/07/22 0447   sodium chloride flush (NS) 0.9 % injection 3 mL  3 mL Intravenous Q12H Georganna Skeans, MD   3 mL at 06/08/22 1239   sodium chloride flush (NS) 0.9 % injection 3 mL  3 mL Intravenous PRN Georganna Skeans, MD       traMADol Veatrice Bourbon) tablet 50 mg  50 mg Oral Q6H PRN Wellington [place], PA-C         Discharge Medications: Please see discharge summary for a list of discharge medications.  Relevant Imaging Results:  Relevant Lab Results:   Additional Information SS# [id number]  Reinaldo Raddle, RN, BSN  Trauma/Neuro ICU Case Manager [phone]

## 2022-06-09 DIAGNOSIS — R2681 Unsteadiness on feet: Secondary | ICD-10-CM | POA: Diagnosis not present

## 2022-06-09 DIAGNOSIS — M810 Age-related osteoporosis without current pathological fracture: Secondary | ICD-10-CM | POA: Diagnosis not present

## 2022-06-09 DIAGNOSIS — M6281 Muscle weakness (generalized): Secondary | ICD-10-CM | POA: Diagnosis not present

## 2022-06-09 DIAGNOSIS — E46 Unspecified protein-calorie malnutrition: Secondary | ICD-10-CM | POA: Diagnosis not present

## 2022-06-09 DIAGNOSIS — I5043 Acute on chronic combined systolic (congestive) and diastolic (congestive) heart failure: Secondary | ICD-10-CM | POA: Diagnosis not present

## 2022-06-09 DIAGNOSIS — F32A Depression, unspecified: Secondary | ICD-10-CM | POA: Diagnosis not present

## 2022-06-09 DIAGNOSIS — I10 Essential (primary) hypertension: Secondary | ICD-10-CM | POA: Diagnosis not present

## 2022-06-09 DIAGNOSIS — I4821 Permanent atrial fibrillation: Secondary | ICD-10-CM | POA: Diagnosis not present

## 2022-06-09 DIAGNOSIS — F419 Anxiety disorder, unspecified: Secondary | ICD-10-CM | POA: Diagnosis not present

## 2022-06-09 DIAGNOSIS — R531 Weakness: Secondary | ICD-10-CM | POA: Diagnosis not present

## 2022-06-09 DIAGNOSIS — S42002A Fracture of unspecified part of left clavicle, initial encounter for closed fracture: Secondary | ICD-10-CM | POA: Diagnosis not present

## 2022-06-09 DIAGNOSIS — Z952 Presence of prosthetic heart valve: Secondary | ICD-10-CM | POA: Diagnosis not present

## 2022-06-09 DIAGNOSIS — E119 Type 2 diabetes mellitus without complications: Secondary | ICD-10-CM | POA: Diagnosis not present

## 2022-06-09 DIAGNOSIS — S20219D Contusion of unspecified front wall of thorax, subsequent encounter: Secondary | ICD-10-CM | POA: Diagnosis not present

## 2022-06-09 DIAGNOSIS — Z9181 History of falling: Secondary | ICD-10-CM | POA: Diagnosis not present

## 2022-06-09 DIAGNOSIS — I5032 Chronic diastolic (congestive) heart failure: Secondary | ICD-10-CM | POA: Diagnosis not present

## 2022-06-09 DIAGNOSIS — S42002D Fracture of unspecified part of left clavicle, subsequent encounter for fracture with routine healing: Secondary | ICD-10-CM | POA: Diagnosis not present

## 2022-06-09 DIAGNOSIS — R262 Difficulty in walking, not elsewhere classified: Secondary | ICD-10-CM | POA: Diagnosis not present

## 2022-06-09 DIAGNOSIS — G20A1 Parkinson's disease without dyskinesia, without mention of fluctuations: Secondary | ICD-10-CM | POA: Diagnosis not present

## 2022-06-09 DIAGNOSIS — Z950 Presence of cardiac pacemaker: Secondary | ICD-10-CM | POA: Diagnosis not present

## 2022-06-09 DIAGNOSIS — I4891 Unspecified atrial fibrillation: Secondary | ICD-10-CM | POA: Diagnosis not present

## 2022-06-09 DIAGNOSIS — R279 Unspecified lack of coordination: Secondary | ICD-10-CM | POA: Diagnosis not present

## 2022-06-09 DIAGNOSIS — Z7401 Bed confinement status: Secondary | ICD-10-CM | POA: Diagnosis not present

## 2022-06-09 LAB — BASIC METABOLIC PANEL
Anion gap: 6 (ref 5–15)
BUN: 39 mg/dL — ABNORMAL HIGH (ref 8–23)
CO2: 25 mmol/L (ref 22–32)
Calcium: 8.2 mg/dL — ABNORMAL LOW (ref 8.9–10.3)
Chloride: 110 mmol/L (ref 98–111)
Creatinine, Ser: 2.14 mg/dL — ABNORMAL HIGH (ref 0.61–1.24)
GFR, Estimated: 28 mL/min — ABNORMAL LOW (ref >60)
Glucose, Bld: 102 mg/dL — ABNORMAL HIGH (ref 70–99)
Potassium: 3.9 mmol/L (ref 3.5–5.1)
Sodium: 141 mmol/L (ref 135–145)

## 2022-06-09 LAB — GLUCOSE, CAPILLARY
Glucose-Capillary: 104 mg/dL — ABNORMAL HIGH (ref 70–99)
Glucose-Capillary: 104 mg/dL — ABNORMAL HIGH (ref 70–99)

## 2022-06-09 MED ORDER — ACETAMINOPHEN 325 MG PO TABS: 650.0000 mg | ORAL_TABLET | Freq: Four times a day (QID) | ORAL | Status: DC | PRN

## 2022-06-09 MED ORDER — METHOCARBAMOL 500 MG PO TABS: 500.0000 mg | ORAL_TABLET | Freq: Three times a day (TID) | ORAL | Status: DC | PRN

## 2022-06-09 NOTE — Care Management Important Message (Signed)
Important Message  Patient Details  Name: [name] [name] MRN: [id number] Date of Birth: [birth year]   Medicare Important Message Given:  Yes     Hannah Beat 06/09/2022, 2:27 PM

## 2022-06-09 NOTE — Progress Notes (Signed)
PT Cancellation Note  Patient Details Name: [name] [name] MRN: [id number] DOB: 16-Sep-1926   Cancelled Treatment:    Reason Eval/Treat Not Completed: Other (comment); NT reports just back to bed after toileting and transport on the way to go to rehab.   Reginia Naas 06/09/2022, 12:18 PM Magda Kiel, PT Acute Rehabilitation Services Office:[phone] 06/09/2022

## 2022-06-09 NOTE — Discharge Summary (Signed)
Patient ID: [name] [name] [id number] 1926/12/30 88 y.o.  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 Katha Hamming, ortho   Reason for Admission: 88yo M with PMHx CHF, pacemaker, afib on coumadin and DM fell at home walking from the living room to the dining room.  No loss of consciousness.  He underwent evaluation in the emergency department was found to have a left clavicle fracture with a large associated hematoma.  I was asked to see him for admission to observe this area.  The emergency department physician reviewed his chest CT with TCTS and there was no vascular injury noted.  Orthopedics has been consulted.  Family is at the bedside and assists with his medical history.  Of note, he has been on eyedrops for irritation of his right eye for the past few weeks.   Procedures none  Hospital Course:  The patient was admitted and his eliquis  was held and then stopped.  Ortho was consulted and recommended non-op therapy for his clavicle fx and sling for comfort.  He mobilized with therapy and SNF was recommended.  He was medically stable for DC on hospital day.  He will follow up with ortho and his PCP.  I did not participate in this patient's care.  Info was obtained from the medical record.   Allergies as of 06/09/2022       Reactions   Amiodarone Other (See Comments)   Amiodarone induced hepatitis        Medication List     STOP taking these medications    Eliquis 5 MG Tabs tablet Generic drug: apixaban   mupirocin ointment 2 % Commonly known as: BACTROBAN   triamcinolone cream 0.1 % Commonly known as: KENALOG       TAKE these medications    acetaminophen 325 MG tablet Commonly known as: TYLENOL Take 2 tablets (650 mg total) by mouth every 6 (six) hours as needed for mild pain or fever. What changed:  how much to take when to take this reasons to take this   carbidopa-levodopa 25-100 MG tablet Commonly known as: SINEMET IR Take 1 tablet by mouth in the morning, at noon, and at bedtime.   furosemide 20 MG tablet Commonly known as: LASIX Take 10 mg by mouth in the morning. What changed: Another medication with the same name was removed. Continue taking this medication, and follow the directions you see here.   Januvia 100 MG tablet Generic drug: sitaGLIPtin  Take 100 mg by mouth daily.   lactose free nutrition Liqd Take 237 mLs by mouth in the morning.   lisinopril 5 MG tablet Commonly known as: ZESTRIL Take 1 tablet by mouth once daily   loratadine 10 MG tablet Commonly known as: CLARITIN Take 10 mg by mouth daily.   Melatonin 10 MG Tabs Take 10 mg by mouth at bedtime as needed (for sleep).   methocarbamol 500 MG tablet Commonly known as: ROBAXIN Take 1 tablet (500 mg total) by mouth every 8 (eight) hours as needed for muscle spasms.   metoprolol succinate 25 MG 24 hr  tablet Commonly known as: TOPROL-XL Take 25 mg by mouth daily. Take with or immediately following a meal.   mirtazapine 7.5 MG tablet Commonly known as: REMERON Take 7.5 mg by mouth at bedtime.   Mucinex 600 MG 12 hr tablet Generic drug: guaiFENesin Take 600 mg by mouth 2 (two) times daily as needed for to loosen phlegm or cough.   sertraline 50 MG tablet Commonly known as: ZOLOFT Take 50 mg by mouth at bedtime.          Follow-up Information     Haddix, Thomasene Lot, MD. Schedule an appointment as soon as possible for a visit in 2 week(s).   Specialty: Orthopedic Surgery Why: Follow up regarding clavicle fracture Contact information: [place] [address] [phone]         Nicoletta Dress, MD. Call.   Specialty: Internal Medicine Why: Call for post-hospitalization follow up appointment with your primary care physician Contact information: [place] [address] [phone]                 Signed: Saverio Danker, [facility] Surgery 06/09/2022, 10:56 AM Please see Amion for pager number during day hours 7:00am-4:30pm, 7-11:30am on Weekends

## 2022-06-09 NOTE — Discharge Instructions (Signed)
Do not resume Eliquis

## 2022-06-09 NOTE — TOC Transition Note (Signed)
Transition of Care [facility]) - CM/SW Discharge Note   Patient Details  Name: [name] [name] MRN: [id number] Date of Birth: 29-Sep-1926  [facility]) CM/SW Contact:  Ella Bodo, RN Phone Number: 06/09/2022, 10:06am   Clinical Narrative:    Notified by [facility] in [place] that patient has been accepted for admission, and bed is available today.  Attending MD notified; patient medically stable for discharge.  Spoke with patient's daughter-in-law, Robet Leu, and she is very pleased with this news.  Will forward discharge summary to facility when available.  12:59 pm Addendum: PTAR notified for transport to SNF.  Bedside nurse is aware to call report to [phone], extension [phone].   Final next level of care: Skilled Nursing Facility Barriers to Discharge: Barriers Resolved   Patient Goals and CMS Choice   CMS Medicare.gov Compare Post Acute Care list provided to:: Patient Represenative (must comment) (son) Choice offered to / list presented to : Adult Children  Discharge Placement PASRR number recieved: 06/08/22            Patient chooses bed at: [facility] [place] Patient to be transferred to facility by: [facility] Name of family member notified: Robet Leu, daughter in law Patient and family notified of of transfer: 06/09/22  Discharge Plan and Services   Discharge Planning Services: CM Consult Post Acute Care Choice: [place]                               Social Determinants of Health (SDOH) Interventions     Readmission Risk Interventions     No data to display          Reinaldo Raddle, RN, BSN  Trauma/Neuro ICU Case Manager [phone]

## 2022-06-09 NOTE — Progress Notes (Signed)
[facility] Progress Note     Subjective: Resting this AM, denies pain. He is able is move left shoulder. Working on SNF placement. BP improved.   Objective: Vital signs in last 24 hours: Temp:  [97.2 F (36.2 C)-98 F (36.7 C)] 97.9 F (36.6 C) (12/01 0704) Pulse Rate:  [70-73] 73 (12/01 0704) Resp:  [17-21] 18 (12/01 0704) BP: (111-138)/(61-78) 133/77 (12/01 0704) SpO2:  [91 %-96 %] 94 % (12/01 0704)    Intake/Output from previous day: 11/30 0701 - 12/01 0700 In: 504.2 [I.V.:504.2] Out: 500 [Urine:500] Intake/Output this shift: No intake/output data recorded.  PE: Gen:  Alert, NAD, pleasant HEENT: EOM's intact, pupils equal and round, right subconjunctival hemorrhage  Card:  RRR, palpable pedal and radial pulses Pulm:  CTAB, no W/R/R, rate and effort normal on room air Abd: Soft, NT/ND Ext:  calves soft and nontender. LUE in sling, NVI, lower left neck/ anterior upper left shoulder hematoma present Psych: A&Ox4 Skin: no rashes noted, warm and dry  Lab Results:  Recent Labs    06/07/22 0020 06/08/22 0536  WBC 17.7* 11.6*  HGB 13.3 10.5*  HCT 40.4 32.8*  PLT 124* 112*    BMET Recent Labs    06/08/22 0536 06/09/22 0421  NA 143 141  K 4.1 3.9  CL 109 110  CO2 25 25  GLUCOSE 105* 102*  BUN 44* 39*  CREATININE 2.20* 2.14*  CALCIUM 8.3* 8.2*    PT/INR Recent Labs    06/06/22 1704 06/07/22 0020  LABPROT 17.2* 17.6*  INR 1.4* 1.5*    CMP     Component Value Date/Time   NA 141 06/09/2022 0421   NA 146 (H) 09/29/2021 1108   K 3.9 06/09/2022 0421   CL 110 06/09/2022 0421   CO2 25 06/09/2022 0421   GLUCOSE 102 (H) 06/09/2022 0421   BUN 39 (H) 06/09/2022 0421   BUN 28 09/29/2021 1108   CREATININE 2.14 (H) 06/09/2022 0421   CALCIUM 8.2 (L) 06/09/2022 0421   PROT 7.0 04/05/2018 1042   ALBUMIN 3.7 04/05/2018 1042   AST 27 04/05/2018 1042   ALT <5 04/05/2018 1042   ALKPHOS 49 04/05/2018 1042   BILITOT 1.4 (H) 04/05/2018 1042    GFRNONAA 28 (L) 06/09/2022 0421   GFRAA 47 (L) 04/22/2018 1516   Lipase  No results found for: "LIPASE"     Studies/Results: DG Clavicle Left  Result Date: 06/07/2022 CLINICAL DATA:  Fracture of unspecified part of left clavicle, initial encounter for closed fracture EXAM: LEFT CLAVICLE - 2+ VIEWS COMPARISON:  Radiograph 06/06/2022 FINDINGS: Displaced left medial clavicle fracture as seen on recent CT. There is a chronic right clavicle fracture. There is chronic widening of the left AC joint with small ossified joint body. IMPRESSION: Displaced left medial clavicle fracture as seen on recent CT. Electronically Signed   By: Maurine Simmering M.D.   On: 06/07/2022 10:25    Anti-infectives: Anti-infectives (From admission, onward)    None        Assessment/Plan Multiple recent falls  Left clavicle fracture with large hematoma - hold eliquis - discussed with family that we recommend holding this indefinitely given his multiple recent falls. Was reviewed by TCTS and there was no active bleeding or vascular injury seen. 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 SNF recommended, medically stable for discharge     LOS: 3 days    Norm Parcel, [facility] Surgery 06/09/2022, 10:04 AM Please see Amion for pager number during day hours 7:00am-4:30pm

## 2022-06-12 DIAGNOSIS — I5032 Chronic diastolic (congestive) heart failure: Secondary | ICD-10-CM | POA: Diagnosis not present

## 2022-06-12 DIAGNOSIS — M810 Age-related osteoporosis without current pathological fracture: Secondary | ICD-10-CM | POA: Diagnosis not present

## 2022-06-12 DIAGNOSIS — I4891 Unspecified atrial fibrillation: Secondary | ICD-10-CM | POA: Diagnosis not present

## 2022-06-12 DIAGNOSIS — R262 Difficulty in walking, not elsewhere classified: Secondary | ICD-10-CM | POA: Diagnosis not present

## 2022-06-28 DIAGNOSIS — M80012D Age-related osteoporosis with current pathological fracture, left shoulder, subsequent encounter for fracture with routine healing: Secondary | ICD-10-CM | POA: Diagnosis not present

## 2022-06-28 DIAGNOSIS — M1990 Unspecified osteoarthritis, unspecified site: Secondary | ICD-10-CM | POA: Diagnosis not present

## 2022-06-28 DIAGNOSIS — M519 Unspecified thoracic, thoracolumbar and lumbosacral intervertebral disc disorder: Secondary | ICD-10-CM | POA: Diagnosis not present

## 2022-06-28 DIAGNOSIS — R630 Anorexia: Secondary | ICD-10-CM | POA: Diagnosis not present

## 2022-06-28 DIAGNOSIS — Z87891 Personal history of nicotine dependence: Secondary | ICD-10-CM | POA: Diagnosis not present

## 2022-06-28 DIAGNOSIS — S20219D Contusion of unspecified front wall of thorax, subsequent encounter: Secondary | ICD-10-CM | POA: Diagnosis not present

## 2022-06-28 DIAGNOSIS — Z79891 Long term (current) use of opiate analgesic: Secondary | ICD-10-CM | POA: Diagnosis not present

## 2022-06-28 DIAGNOSIS — N529 Male erectile dysfunction, unspecified: Secondary | ICD-10-CM | POA: Diagnosis not present

## 2022-06-28 DIAGNOSIS — Z85828 Personal history of other malignant neoplasm of skin: Secondary | ICD-10-CM | POA: Diagnosis not present

## 2022-06-28 DIAGNOSIS — I5043 Acute on chronic combined systolic (congestive) and diastolic (congestive) heart failure: Secondary | ICD-10-CM | POA: Diagnosis not present

## 2022-06-28 DIAGNOSIS — I952 Hypotension due to drugs: Secondary | ICD-10-CM | POA: Diagnosis not present

## 2022-06-28 DIAGNOSIS — E1122 Type 2 diabetes mellitus with diabetic chronic kidney disease: Secondary | ICD-10-CM | POA: Diagnosis not present

## 2022-06-28 DIAGNOSIS — N184 Chronic kidney disease, stage 4 (severe): Secondary | ICD-10-CM | POA: Diagnosis not present

## 2022-06-28 DIAGNOSIS — I130 Hypertensive heart and chronic kidney disease with heart failure and stage 1 through stage 4 chronic kidney disease, or unspecified chronic kidney disease: Secondary | ICD-10-CM | POA: Diagnosis not present

## 2022-06-28 DIAGNOSIS — Z952 Presence of prosthetic heart valve: Secondary | ICD-10-CM | POA: Diagnosis not present

## 2022-06-28 DIAGNOSIS — I428 Other cardiomyopathies: Secondary | ICD-10-CM | POA: Diagnosis not present

## 2022-06-28 DIAGNOSIS — G20A1 Parkinson's disease without dyskinesia, without mention of fluctuations: Secondary | ICD-10-CM | POA: Diagnosis not present

## 2022-06-28 DIAGNOSIS — Z7984 Long term (current) use of oral hypoglycemic drugs: Secondary | ICD-10-CM | POA: Diagnosis not present

## 2022-06-28 DIAGNOSIS — I4821 Permanent atrial fibrillation: Secondary | ICD-10-CM | POA: Diagnosis not present

## 2022-06-28 DIAGNOSIS — L603 Nail dystrophy: Secondary | ICD-10-CM | POA: Diagnosis not present

## 2022-06-28 DIAGNOSIS — F321 Major depressive disorder, single episode, moderate: Secondary | ICD-10-CM | POA: Diagnosis not present

## 2022-06-28 DIAGNOSIS — E46 Unspecified protein-calorie malnutrition: Secondary | ICD-10-CM | POA: Diagnosis not present

## 2022-06-28 DIAGNOSIS — F419 Anxiety disorder, unspecified: Secondary | ICD-10-CM | POA: Diagnosis not present

## 2022-06-28 DIAGNOSIS — Z950 Presence of cardiac pacemaker: Secondary | ICD-10-CM | POA: Diagnosis not present

## 2022-06-28 DIAGNOSIS — I495 Sick sinus syndrome: Secondary | ICD-10-CM | POA: Diagnosis not present

## 2022-06-29 DIAGNOSIS — N184 Chronic kidney disease, stage 4 (severe): Secondary | ICD-10-CM | POA: Diagnosis not present

## 2022-06-29 DIAGNOSIS — F321 Major depressive disorder, single episode, moderate: Secondary | ICD-10-CM | POA: Diagnosis not present

## 2022-06-29 DIAGNOSIS — I482 Chronic atrial fibrillation, unspecified: Secondary | ICD-10-CM | POA: Diagnosis not present

## 2022-06-29 DIAGNOSIS — R809 Proteinuria, unspecified: Secondary | ICD-10-CM | POA: Diagnosis not present

## 2022-06-29 DIAGNOSIS — E785 Hyperlipidemia, unspecified: Secondary | ICD-10-CM | POA: Diagnosis not present

## 2022-06-29 DIAGNOSIS — S42002A Fracture of unspecified part of left clavicle, initial encounter for closed fracture: Secondary | ICD-10-CM | POA: Diagnosis not present

## 2022-06-29 DIAGNOSIS — I5022 Chronic systolic (congestive) heart failure: Secondary | ICD-10-CM | POA: Diagnosis not present

## 2022-06-29 DIAGNOSIS — E1129 Type 2 diabetes mellitus with other diabetic kidney complication: Secondary | ICD-10-CM | POA: Diagnosis not present

## 2022-07-05 DIAGNOSIS — F0390 Unspecified dementia without behavioral disturbance: Secondary | ICD-10-CM | POA: Diagnosis not present

## 2022-07-05 DIAGNOSIS — G20A1 Parkinson's disease without dyskinesia, without mention of fluctuations: Secondary | ICD-10-CM | POA: Diagnosis not present

## 2022-07-05 DIAGNOSIS — R509 Fever, unspecified: Secondary | ICD-10-CM | POA: Diagnosis not present

## 2022-07-05 DIAGNOSIS — I10 Essential (primary) hypertension: Secondary | ICD-10-CM | POA: Diagnosis not present

## 2022-07-05 DIAGNOSIS — I5023 Acute on chronic systolic (congestive) heart failure: Secondary | ICD-10-CM | POA: Diagnosis not present

## 2022-07-05 DIAGNOSIS — F32A Depression, unspecified: Secondary | ICD-10-CM | POA: Diagnosis not present

## 2022-07-05 DIAGNOSIS — E119 Type 2 diabetes mellitus without complications: Secondary | ICD-10-CM | POA: Diagnosis not present

## 2022-07-05 DIAGNOSIS — R0989 Other specified symptoms and signs involving the circulatory and respiratory systems: Secondary | ICD-10-CM | POA: Diagnosis not present

## 2022-07-05 DIAGNOSIS — Z6820 Body mass index (BMI) 20.0-20.9, adult: Secondary | ICD-10-CM | POA: Diagnosis not present

## 2022-07-05 DIAGNOSIS — Z9181 History of falling: Secondary | ICD-10-CM | POA: Diagnosis not present

## 2022-07-05 DIAGNOSIS — I4821 Permanent atrial fibrillation: Secondary | ICD-10-CM | POA: Diagnosis not present

## 2022-07-05 NOTE — Progress Notes (Signed)
Remote pacemaker transmission.   

## 2022-07-06 DIAGNOSIS — S2020XA Contusion of thorax, unspecified, initial encounter: Secondary | ICD-10-CM | POA: Diagnosis not present

## 2022-07-06 DIAGNOSIS — F0390 Unspecified dementia without behavioral disturbance: Secondary | ICD-10-CM | POA: Diagnosis not present

## 2022-07-06 DIAGNOSIS — S2241XA Multiple fractures of ribs, right side, initial encounter for closed fracture: Secondary | ICD-10-CM | POA: Diagnosis not present

## 2022-07-06 DIAGNOSIS — R0782 Intercostal pain: Secondary | ICD-10-CM | POA: Diagnosis not present

## 2022-07-06 DIAGNOSIS — W19XXXA Unspecified fall, initial encounter: Secondary | ICD-10-CM | POA: Diagnosis not present

## 2022-07-06 DIAGNOSIS — R269 Unspecified abnormalities of gait and mobility: Secondary | ICD-10-CM | POA: Diagnosis not present

## 2022-07-06 DIAGNOSIS — Z9181 History of falling: Secondary | ICD-10-CM | POA: Diagnosis not present

## 2022-07-06 DIAGNOSIS — M6281 Muscle weakness (generalized): Secondary | ICD-10-CM | POA: Diagnosis not present

## 2022-07-06 DIAGNOSIS — R2689 Other abnormalities of gait and mobility: Secondary | ICD-10-CM | POA: Diagnosis not present

## 2022-07-06 DIAGNOSIS — J09X9 Influenza due to identified novel influenza A virus with other manifestations: Secondary | ICD-10-CM | POA: Diagnosis not present

## 2022-07-07 DIAGNOSIS — E1122 Type 2 diabetes mellitus with diabetic chronic kidney disease: Secondary | ICD-10-CM | POA: Diagnosis not present

## 2022-07-07 DIAGNOSIS — N184 Chronic kidney disease, stage 4 (severe): Secondary | ICD-10-CM | POA: Diagnosis not present

## 2022-07-07 DIAGNOSIS — I130 Hypertensive heart and chronic kidney disease with heart failure and stage 1 through stage 4 chronic kidney disease, or unspecified chronic kidney disease: Secondary | ICD-10-CM | POA: Diagnosis not present

## 2022-07-07 DIAGNOSIS — M80012D Age-related osteoporosis with current pathological fracture, left shoulder, subsequent encounter for fracture with routine healing: Secondary | ICD-10-CM | POA: Diagnosis not present

## 2022-07-07 DIAGNOSIS — I5043 Acute on chronic combined systolic (congestive) and diastolic (congestive) heart failure: Secondary | ICD-10-CM | POA: Diagnosis not present

## 2022-07-07 DIAGNOSIS — I4821 Permanent atrial fibrillation: Secondary | ICD-10-CM | POA: Diagnosis not present

## 2022-07-09 DIAGNOSIS — R0902 Hypoxemia: Secondary | ICD-10-CM | POA: Diagnosis not present

## 2022-07-09 DIAGNOSIS — G9341 Metabolic encephalopathy: Secondary | ICD-10-CM | POA: Diagnosis not present

## 2022-07-09 DIAGNOSIS — J189 Pneumonia, unspecified organism: Secondary | ICD-10-CM | POA: Diagnosis not present

## 2022-07-09 DIAGNOSIS — R0602 Shortness of breath: Secondary | ICD-10-CM | POA: Diagnosis not present

## 2022-07-09 DIAGNOSIS — R059 Cough, unspecified: Secondary | ICD-10-CM | POA: Diagnosis not present

## 2022-07-09 DIAGNOSIS — J1108 Influenza due to unidentified influenza virus with specified pneumonia: Secondary | ICD-10-CM | POA: Diagnosis not present

## 2022-07-09 DIAGNOSIS — J111 Influenza due to unidentified influenza virus with other respiratory manifestations: Secondary | ICD-10-CM | POA: Diagnosis not present

## 2022-07-09 DIAGNOSIS — R9431 Abnormal electrocardiogram [ECG] [EKG]: Secondary | ICD-10-CM | POA: Diagnosis not present

## 2022-07-09 DIAGNOSIS — R531 Weakness: Secondary | ICD-10-CM | POA: Diagnosis not present

## 2022-07-09 DIAGNOSIS — R5383 Other fatigue: Secondary | ICD-10-CM | POA: Diagnosis not present

## 2022-07-09 DIAGNOSIS — J9601 Acute respiratory failure with hypoxia: Secondary | ICD-10-CM | POA: Diagnosis not present

## 2022-07-10 DIAGNOSIS — J9601 Acute respiratory failure with hypoxia: Secondary | ICD-10-CM | POA: Diagnosis not present

## 2022-07-10 DIAGNOSIS — R2681 Unsteadiness on feet: Secondary | ICD-10-CM | POA: Diagnosis not present

## 2022-07-10 DIAGNOSIS — I4821 Permanent atrial fibrillation: Secondary | ICD-10-CM | POA: Diagnosis not present

## 2022-07-10 DIAGNOSIS — F0390 Unspecified dementia without behavioral disturbance: Secondary | ICD-10-CM | POA: Diagnosis not present

## 2022-07-10 DIAGNOSIS — Z8781 Personal history of (healed) traumatic fracture: Secondary | ICD-10-CM | POA: Diagnosis not present

## 2022-07-10 DIAGNOSIS — G20A1 Parkinson's disease without dyskinesia, without mention of fluctuations: Secondary | ICD-10-CM | POA: Diagnosis not present

## 2022-07-10 DIAGNOSIS — R9431 Abnormal electrocardiogram [ECG] [EKG]: Secondary | ICD-10-CM | POA: Diagnosis not present

## 2022-07-10 DIAGNOSIS — R7989 Other specified abnormal findings of blood chemistry: Secondary | ICD-10-CM | POA: Diagnosis present

## 2022-07-10 DIAGNOSIS — F03B0 Unspecified dementia, moderate, without behavioral disturbance, psychotic disturbance, mood disturbance, and anxiety: Secondary | ICD-10-CM | POA: Diagnosis present

## 2022-07-10 DIAGNOSIS — Z66 Do not resuscitate: Secondary | ICD-10-CM | POA: Diagnosis present

## 2022-07-10 DIAGNOSIS — J159 Unspecified bacterial pneumonia: Secondary | ICD-10-CM | POA: Diagnosis present

## 2022-07-10 DIAGNOSIS — R531 Weakness: Secondary | ICD-10-CM | POA: Diagnosis not present

## 2022-07-10 DIAGNOSIS — F419 Anxiety disorder, unspecified: Secondary | ICD-10-CM | POA: Diagnosis not present

## 2022-07-10 DIAGNOSIS — R0602 Shortness of breath: Secondary | ICD-10-CM | POA: Diagnosis not present

## 2022-07-10 DIAGNOSIS — M6281 Muscle weakness (generalized): Secondary | ICD-10-CM | POA: Diagnosis not present

## 2022-07-10 DIAGNOSIS — E876 Hypokalemia: Secondary | ICD-10-CM | POA: Diagnosis present

## 2022-07-10 DIAGNOSIS — Z950 Presence of cardiac pacemaker: Secondary | ICD-10-CM | POA: Diagnosis not present

## 2022-07-10 DIAGNOSIS — Z792 Long term (current) use of antibiotics: Secondary | ICD-10-CM | POA: Diagnosis not present

## 2022-07-10 DIAGNOSIS — Z79899 Other long term (current) drug therapy: Secondary | ICD-10-CM | POA: Diagnosis not present

## 2022-07-10 DIAGNOSIS — J440 Chronic obstructive pulmonary disease with acute lower respiratory infection: Secondary | ICD-10-CM | POA: Diagnosis present

## 2022-07-10 DIAGNOSIS — F32A Depression, unspecified: Secondary | ICD-10-CM | POA: Diagnosis not present

## 2022-07-10 DIAGNOSIS — I5043 Acute on chronic combined systolic (congestive) and diastolic (congestive) heart failure: Secondary | ICD-10-CM | POA: Diagnosis not present

## 2022-07-10 DIAGNOSIS — R0902 Hypoxemia: Secondary | ICD-10-CM | POA: Diagnosis not present

## 2022-07-10 DIAGNOSIS — I10 Essential (primary) hypertension: Secondary | ICD-10-CM | POA: Diagnosis not present

## 2022-07-10 DIAGNOSIS — I2510 Atherosclerotic heart disease of native coronary artery without angina pectoris: Secondary | ICD-10-CM | POA: Diagnosis present

## 2022-07-10 DIAGNOSIS — R059 Cough, unspecified: Secondary | ICD-10-CM | POA: Diagnosis not present

## 2022-07-10 DIAGNOSIS — R2689 Other abnormalities of gait and mobility: Secondary | ICD-10-CM | POA: Diagnosis not present

## 2022-07-10 DIAGNOSIS — Z7401 Bed confinement status: Secondary | ICD-10-CM | POA: Diagnosis not present

## 2022-07-10 DIAGNOSIS — M810 Age-related osteoporosis without current pathological fracture: Secondary | ICD-10-CM | POA: Diagnosis not present

## 2022-07-10 DIAGNOSIS — R1312 Dysphagia, oropharyngeal phase: Secondary | ICD-10-CM | POA: Diagnosis not present

## 2022-07-10 DIAGNOSIS — N170 Acute kidney failure with tubular necrosis: Secondary | ICD-10-CM | POA: Diagnosis present

## 2022-07-10 DIAGNOSIS — E119 Type 2 diabetes mellitus without complications: Secondary | ICD-10-CM | POA: Diagnosis not present

## 2022-07-10 DIAGNOSIS — Z952 Presence of prosthetic heart valve: Secondary | ICD-10-CM | POA: Diagnosis not present

## 2022-07-10 DIAGNOSIS — J111 Influenza due to unidentified influenza virus with other respiratory manifestations: Secondary | ICD-10-CM | POA: Diagnosis not present

## 2022-07-10 DIAGNOSIS — Z9181 History of falling: Secondary | ICD-10-CM | POA: Diagnosis not present

## 2022-07-10 DIAGNOSIS — R5383 Other fatigue: Secondary | ICD-10-CM | POA: Diagnosis not present

## 2022-07-10 DIAGNOSIS — J1108 Influenza due to unidentified influenza virus with specified pneumonia: Secondary | ICD-10-CM | POA: Diagnosis present

## 2022-07-10 DIAGNOSIS — G9341 Metabolic encephalopathy: Secondary | ICD-10-CM | POA: Diagnosis not present

## 2022-07-10 DIAGNOSIS — J189 Pneumonia, unspecified organism: Secondary | ICD-10-CM | POA: Diagnosis not present

## 2022-07-13 DIAGNOSIS — F419 Anxiety disorder, unspecified: Secondary | ICD-10-CM | POA: Diagnosis not present

## 2022-07-13 DIAGNOSIS — I5043 Acute on chronic combined systolic (congestive) and diastolic (congestive) heart failure: Secondary | ICD-10-CM | POA: Diagnosis not present

## 2022-07-13 DIAGNOSIS — I10 Essential (primary) hypertension: Secondary | ICD-10-CM | POA: Diagnosis not present

## 2022-07-13 DIAGNOSIS — J159 Unspecified bacterial pneumonia: Secondary | ICD-10-CM | POA: Diagnosis not present

## 2022-07-13 DIAGNOSIS — E876 Hypokalemia: Secondary | ICD-10-CM | POA: Diagnosis not present

## 2022-07-13 DIAGNOSIS — M6281 Muscle weakness (generalized): Secondary | ICD-10-CM | POA: Diagnosis not present

## 2022-07-13 DIAGNOSIS — R1312 Dysphagia, oropharyngeal phase: Secondary | ICD-10-CM | POA: Diagnosis not present

## 2022-07-13 DIAGNOSIS — J9601 Acute respiratory failure with hypoxia: Secondary | ICD-10-CM | POA: Diagnosis not present

## 2022-07-13 DIAGNOSIS — N1831 Chronic kidney disease, stage 3a: Secondary | ICD-10-CM | POA: Diagnosis not present

## 2022-07-13 DIAGNOSIS — Z7401 Bed confinement status: Secondary | ICD-10-CM | POA: Diagnosis not present

## 2022-07-13 DIAGNOSIS — Z950 Presence of cardiac pacemaker: Secondary | ICD-10-CM | POA: Diagnosis not present

## 2022-07-13 DIAGNOSIS — Z9181 History of falling: Secondary | ICD-10-CM | POA: Diagnosis not present

## 2022-07-13 DIAGNOSIS — Z952 Presence of prosthetic heart valve: Secondary | ICD-10-CM | POA: Diagnosis not present

## 2022-07-13 DIAGNOSIS — E119 Type 2 diabetes mellitus without complications: Secondary | ICD-10-CM | POA: Diagnosis not present

## 2022-07-13 DIAGNOSIS — R2689 Other abnormalities of gait and mobility: Secondary | ICD-10-CM | POA: Diagnosis not present

## 2022-07-13 DIAGNOSIS — F32A Depression, unspecified: Secondary | ICD-10-CM | POA: Diagnosis not present

## 2022-07-13 DIAGNOSIS — E441 Mild protein-calorie malnutrition: Secondary | ICD-10-CM | POA: Diagnosis not present

## 2022-07-13 DIAGNOSIS — I4821 Permanent atrial fibrillation: Secondary | ICD-10-CM | POA: Diagnosis not present

## 2022-07-13 DIAGNOSIS — R262 Difficulty in walking, not elsewhere classified: Secondary | ICD-10-CM | POA: Diagnosis not present

## 2022-07-13 DIAGNOSIS — M810 Age-related osteoporosis without current pathological fracture: Secondary | ICD-10-CM | POA: Diagnosis not present

## 2022-07-13 DIAGNOSIS — G9341 Metabolic encephalopathy: Secondary | ICD-10-CM | POA: Diagnosis not present

## 2022-07-13 DIAGNOSIS — J111 Influenza due to unidentified influenza virus with other respiratory manifestations: Secondary | ICD-10-CM | POA: Diagnosis not present

## 2022-07-13 DIAGNOSIS — R2681 Unsteadiness on feet: Secondary | ICD-10-CM | POA: Diagnosis not present

## 2022-07-13 DIAGNOSIS — J1108 Influenza due to unidentified influenza virus with specified pneumonia: Secondary | ICD-10-CM | POA: Diagnosis not present

## 2022-07-13 DIAGNOSIS — F0390 Unspecified dementia without behavioral disturbance: Secondary | ICD-10-CM | POA: Diagnosis not present

## 2022-07-13 DIAGNOSIS — G20A1 Parkinson's disease without dyskinesia, without mention of fluctuations: Secondary | ICD-10-CM | POA: Diagnosis not present

## 2022-07-18 DIAGNOSIS — E441 Mild protein-calorie malnutrition: Secondary | ICD-10-CM | POA: Diagnosis not present

## 2022-07-18 DIAGNOSIS — F0390 Unspecified dementia without behavioral disturbance: Secondary | ICD-10-CM | POA: Diagnosis not present

## 2022-07-18 DIAGNOSIS — R262 Difficulty in walking, not elsewhere classified: Secondary | ICD-10-CM | POA: Diagnosis not present

## 2022-07-18 DIAGNOSIS — N1831 Chronic kidney disease, stage 3a: Secondary | ICD-10-CM | POA: Diagnosis not present

## 2022-08-01 DIAGNOSIS — E119 Type 2 diabetes mellitus without complications: Secondary | ICD-10-CM | POA: Diagnosis not present

## 2022-08-01 DIAGNOSIS — E559 Vitamin D deficiency, unspecified: Secondary | ICD-10-CM | POA: Diagnosis not present

## 2022-08-01 DIAGNOSIS — I10 Essential (primary) hypertension: Secondary | ICD-10-CM | POA: Diagnosis not present

## 2022-08-03 DIAGNOSIS — F5105 Insomnia due to other mental disorder: Secondary | ICD-10-CM | POA: Diagnosis not present

## 2022-08-03 DIAGNOSIS — F413 Other mixed anxiety disorders: Secondary | ICD-10-CM | POA: Diagnosis not present

## 2022-08-03 DIAGNOSIS — F32A Depression, unspecified: Secondary | ICD-10-CM | POA: Diagnosis not present

## 2022-08-10 DIAGNOSIS — E441 Mild protein-calorie malnutrition: Secondary | ICD-10-CM | POA: Diagnosis not present

## 2022-08-10 DIAGNOSIS — D649 Anemia, unspecified: Secondary | ICD-10-CM | POA: Diagnosis not present

## 2022-08-10 DIAGNOSIS — R5381 Other malaise: Secondary | ICD-10-CM | POA: Diagnosis not present

## 2022-08-10 DIAGNOSIS — M810 Age-related osteoporosis without current pathological fracture: Secondary | ICD-10-CM | POA: Diagnosis not present

## 2022-09-01 ENCOUNTER — Encounter: Payer: Self-pay | Admitting: Cardiology

## 2022-09-05 ENCOUNTER — Ambulatory Visit: Payer: Medicare Other

## 2022-09-05 DIAGNOSIS — I495 Sick sinus syndrome: Secondary | ICD-10-CM

## 2022-09-05 LAB — CUP PACEART REMOTE DEVICE CHECK
Battery Remaining Longevity: 93 mo
Battery Remaining Percentage: 94 %
Battery Voltage: 3.01 V
Date Time Interrogation Session: 20240227024712
Implantable Lead Connection Status: 753985
Implantable Lead Connection Status: 753985
Implantable Lead Implant Date: 20141226
Implantable Lead Implant Date: 20141226
Implantable Lead Location: 753858
Implantable Lead Location: 753860
Implantable Pulse Generator Implant Date: 20230329
Lead Channel Impedance Value: 350 Ohm
Lead Channel Impedance Value: 650 Ohm
Lead Channel Pacing Threshold Amplitude: 0.875 V
Lead Channel Pacing Threshold Amplitude: 0.875 V
Lead Channel Pacing Threshold Pulse Width: 0.4 ms
Lead Channel Pacing Threshold Pulse Width: 0.5 ms
Lead Channel Sensing Intrinsic Amplitude: 5.9 mV
Lead Channel Setting Pacing Amplitude: 2 V
Lead Channel Setting Pacing Amplitude: 2 V
Lead Channel Setting Pacing Pulse Width: 0.4 ms
Lead Channel Setting Pacing Pulse Width: 0.5 ms
Lead Channel Setting Sensing Sensitivity: 4 mV
Pulse Gen Model: 3562
Pulse Gen Serial Number: 8062428

## 2022-09-06 DIAGNOSIS — F339 Major depressive disorder, recurrent, unspecified: Secondary | ICD-10-CM | POA: Diagnosis not present

## 2022-09-06 DIAGNOSIS — E1151 Type 2 diabetes mellitus with diabetic peripheral angiopathy without gangrene: Secondary | ICD-10-CM | POA: Diagnosis not present

## 2022-09-06 DIAGNOSIS — R1310 Dysphagia, unspecified: Secondary | ICD-10-CM | POA: Diagnosis not present

## 2022-09-06 DIAGNOSIS — M2550 Pain in unspecified joint: Secondary | ICD-10-CM | POA: Diagnosis not present

## 2022-09-25 ENCOUNTER — Encounter: Payer: Self-pay | Admitting: Cardiology

## 2022-09-25 ENCOUNTER — Ambulatory Visit: Payer: Medicare Other | Attending: Cardiology | Admitting: Cardiology

## 2022-09-25 VITALS — BP 170/97 | HR 70 | Resp 18 | Ht 68.0 in | Wt 138.8 lb

## 2022-09-25 DIAGNOSIS — Z950 Presence of cardiac pacemaker: Secondary | ICD-10-CM | POA: Insufficient documentation

## 2022-09-25 DIAGNOSIS — Z952 Presence of prosthetic heart valve: Secondary | ICD-10-CM | POA: Diagnosis not present

## 2022-09-25 DIAGNOSIS — I4821 Permanent atrial fibrillation: Secondary | ICD-10-CM | POA: Insufficient documentation

## 2022-09-25 DIAGNOSIS — I420 Dilated cardiomyopathy: Secondary | ICD-10-CM | POA: Diagnosis not present

## 2022-09-25 NOTE — Progress Notes (Signed)
Virtual Visit via Video Note   Because of [name] [name]'s co-morbid illnesses, he is at least at moderate risk for complications without adequate follow up.  This format is felt to be most appropriate for this patient at this time.  All issues noted in this document were discussed and addressed.  A limited physical exam was performed with this format.  Please refer to the patient's chart for his consent to telehealth for [facility].     Evaluation Performed:  Follow-up visit  This visit type was conducted due to national recommendations for restrictions regarding the COVID-19 Pandemic (e.g. social distancing).  This format is felt to be most appropriate for this patient at this time.  All issues noted in this document were discussed and addressed.  No physical exam was performed (except for noted visual exam findings with Video Visits).  Please refer to the patient's chart (MyChart message for video visits and phone note for telephone visits) for the patient's consent to telehealth for [facility].  Date:  09/25/2022  ID: [name] [name], DOB 1927-06-17, MRN [id number]   Patient Location: [place] [id number]   Provider location:   [place] Office  PCP:  Garwin Brothers, MD  Cardiologist:  Jenne Campus, MD     Chief Complaint: Doing fine  History of Present Illness:    [name] [name] is a 89 y.o. male  who presents via audio/video conferencing for a telehealth visit today.  with past medical history significant for sick sinus syndrome, permanent atrial fibrillation, status post CRT-P, 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  He does have  a virtual visit today.  [facility].  Simply became too much for family to take care of him 24/7.  He seems to be quite happy at nursing home.  Denies have any chest pain tightness squeezing pressure burning chest.  When I am talking to him he is lying in the bed.   The patient does not have symptoms concerning for COVID-19 infection (fever, chills, cough, or new SHORTNESS OF BREATH).    Prior CV studies:   The following studies were reviewed today:  I did review echocardiogram done in September which showed normally functioning valve     Past Medical History:  Diagnosis Date   Acute on chronic combined systolic and diastolic CHF (congestive heart failure) ([place]) 06/29/2013   Atrial fibrillation (HCC)    echo- EF 35-40%; LA severely dilated; RA mod dilated, RV systolic pressure 0000000; LV systolic fcn mod reduced   Biventricular cardiac pacemaker upgrade 07/05/13 (St Jude) 06/29/2013   Cancer ([place])    skin cancer   Cardiomyopathy ([place]) 07/07/2013   Non Obstructive CAD by Cath    CHF (congestive heart failure) (HCC)    Critical aortic valve stenosis 06/29/2013   Current use of long term anticoagulation 06/29/2013   Diabetes mellitus without complication ([place])    Hypertension    Parkinson's disease 07/07/2013   Permanent atrial fibrillation- AVN RFA 07/05/13 06/29/2013   Presence of permanent cardiac pacemaker    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   29 mm Edwards Sapien 3 transcatheter heart  valve placed via percutaneous right transfemoral approach    Severe aortic stenosis    SSS (sick sinus syndrome) ([place])    pacemaker placed in 1993   Tremors of nervous system     Past Surgical History:  Procedure Laterality Date   AV NODE ABLATION Bilateral 07/04/2013   Procedure: AV NODE ABLATION;  Surgeon: Evans Lance, MD;  Location: [facility] CATH LAB;  Service: Cardiovascular;  Laterality: Bilateral;   BI-VENTRICULAR  PACEMAKER INSERTION Bilateral 07/04/2013   Procedure: BI-VENTRICULAR PACEMAKER INSERTION (CRT-P);  Surgeon: Evans Lance, MD;  Location: [facility] CATH LAB;  Service: Cardiovascular;  Laterality: Bilateral;   BIV PACEMAKER GENERATOR CHANGE OUT  11/2004   BIV PACEMAKER GENERATOR CHANGEOUT N/A 10/05/2021   Procedure: BIVI PACEMAKER GENERATOR CHANGEOUT;  Surgeon: Constance Haw, MD;  Location: [place] CV LAB;  Service: Cardiovascular;  Laterality: N/A;   COLONOSCOPY     HERNIA REPAIR     left groin   INSERT / REPLACE / REMOVE PACEMAKER     INTRAOPERATIVE TRANSTHORACIC ECHOCARDIOGRAM N/A 04/09/2018   Procedure: INTRAOPERATIVE TRANSTHORACIC ECHOCARDIOGRAM;  Surgeon: Burnell Blanks, MD;  Location: [place];  Service: Open Heart Surgery;  Laterality: N/A;   LEFT HEART CATHETERIZATION WITH CORONARY ANGIOGRAM N/A 07/07/2013   Procedure: LEFT HEART CATHETERIZATION WITH CORONARY ANGIOGRAM;  Surgeon: Pixie Casino, MD;  Location: [facility] CATH LAB;  Service: Cardiovascular;  Laterality: N/A;   PACEMAKER INSERTION  1993   RIGHT/LEFT HEART CATH AND CORONARY ANGIOGRAPHY N/A 03/06/2018   Procedure: RIGHT/LEFT HEART CATH AND CORONARY ANGIOGRAPHY;  Surgeon: Burnell Blanks, MD;  Location: [place] CV LAB;  Service: Cardiovascular;  Laterality: N/A;   TONSILLECTOMY     adenoidectomy   TRANSCATHETER AORTIC VALVE REPLACEMENT, TRANSFEMORAL  04/09/2018   TRANSCATHETER AORTIC VALVE REPLACEMENT, TRANSFEMORAL Right 04/09/2018   Procedure: TRANSCATHETER AORTIC VALVE REPLACEMENT, TRANSFEMORAL. 50mm Edwards Sapien 3 THV.;  Surgeon: Burnell Blanks, MD;  Location: [place];  Service: Open Heart Surgery;  Laterality: Right;     Current Meds  Medication Sig   acetaminophen (TYLENOL) 325 MG tablet Take 2 tablets (650 mg total) by mouth every 6 (six) hours as needed for mild pain or fever.   carbidopa-levodopa (SINEMET IR) 25-100 MG per tablet Take 1 tablet by mouth in the morning, at noon, and at  bedtime.   furosemide (LASIX) 20 MG tablet Take 10 mg by mouth in the morning.   JANUVIA 100 MG tablet Take 100 mg by mouth daily.   lisinopril (ZESTRIL) 5 MG tablet Take 1 tablet by mouth once daily (Patient taking differently: Take 5 mg by mouth daily.)   loratadine (CLARITIN) 10 MG tablet Take 10 mg by mouth daily.   metoprolol succinate (TOPROL-XL) 25 MG 24 hr tablet Take 25 mg by mouth daily. Take with or immediately following a meal.   mirtazapine (REMERON) 7.5 MG tablet Take 7.5 mg by mouth at bedtime.   OVER THE COUNTER MEDICATION 1 application  as needed (incontinence care/ groin). Dermacloud Ointment   Sennosides (SENOKOT PO) Take 2 tablets by mouth at bedtime.   sertraline (ZOLOFT) 50 MG tablet Take 75 mg by mouth at bedtime.   VITAMIN D PO Take 5,000 Units by mouth daily.      Family History: The patient's family history includes Asthma in his father; Coronary artery disease in his mother; Stroke in his mother.   ROS:   Please see the history of present illness.     All other systems reviewed and are  negative.   Labs/Other Tests and Data Reviewed:     Recent Labs: 06/08/2022: Hemoglobin 10.5; Platelets 112 06/09/2022: BUN 39; Creatinine, Ser 2.14; Potassium 3.9; Sodium 141  Recent Lipid Panel No results found for: "CHOL", "TRIG", "HDL", "CHOLHDL", "VLDL", "LDLCALC", "LDLDIRECT"    Exam:    Vital Signs:  BP (!) 170/97   Pulse 70   Resp 18   Ht 5\' 8"  (1.727 m)   Wt 138 lb 12.8 oz (63 kg)   SpO2 92%   BMI 21.10 kg/m     Wt Readings from Last 3 Encounters:  09/25/22 138 lb 12.8 oz (63 kg)  06/07/22 149 lb 14.6 oz (68 kg)  03/22/22 142 lb 3.2 oz (64.5 kg)     Well nourished, well developed in no acute distress. Seems to be comfortable laying down in the bed when I am talking to him.  Family members telling me that he gets off the bed to go to the restroom.  He is very happy and being in nursing home.  Diagnosis for this visit:   No diagnosis  found.   ASSESSMENT & PLAN:    1.  Permanent atrial fibrillation.  Stable from that point review, he is being anticoag with Coumadin. 2.  Status post aortic valve replacement.  Valve is 29 mm TAVI in aortic position procedure on October 2019, normal function, continue present management. 3.  Permanent pacemaker present.  Review interrogation of the device, normal function, 7.5 years left in the device.  VVIR  COVID-19 Education: The signs and symptoms of COVID-19 were discussed with the patient and how to seek care for testing (follow up with PCP or arrange E-visit).  The importance of social distancing was discussed today.  Patient Risk:   After full review of this patients clinical status, I feel that they are at least moderate risk at this time.  Time:   Today, I have spent 15 minutes with the patient with telehealth technology discussing pt health issues.  I spent 15 minutes reviewing her chart before the visit.  Visit was finished at 70.    Medication Adjustments/Labs and Tests Ordered: Current medicines are reviewed at length with the patient today.  Concerns regarding medicines are outlined above.  No orders of the defined types were placed in this encounter.  Medication changes: No orders of the defined types were placed in this encounter.    Disposition: 6 months  Signed, Park Liter, MD, [facility] 09/25/2022 11:18 AM    [place]

## 2022-09-25 NOTE — Patient Instructions (Signed)

## 2022-09-28 DIAGNOSIS — F0390 Unspecified dementia without behavioral disturbance: Secondary | ICD-10-CM | POA: Diagnosis not present

## 2022-09-28 DIAGNOSIS — G9341 Metabolic encephalopathy: Secondary | ICD-10-CM | POA: Diagnosis not present

## 2022-09-28 DIAGNOSIS — R279 Unspecified lack of coordination: Secondary | ICD-10-CM | POA: Diagnosis not present

## 2022-09-28 DIAGNOSIS — M6281 Muscle weakness (generalized): Secondary | ICD-10-CM | POA: Diagnosis not present

## 2022-09-28 DIAGNOSIS — G20A1 Parkinson's disease without dyskinesia, without mention of fluctuations: Secondary | ICD-10-CM | POA: Diagnosis not present

## 2022-09-29 DIAGNOSIS — R279 Unspecified lack of coordination: Secondary | ICD-10-CM | POA: Diagnosis not present

## 2022-09-29 DIAGNOSIS — F0390 Unspecified dementia without behavioral disturbance: Secondary | ICD-10-CM | POA: Diagnosis not present

## 2022-09-29 DIAGNOSIS — G20A1 Parkinson's disease without dyskinesia, without mention of fluctuations: Secondary | ICD-10-CM | POA: Diagnosis not present

## 2022-09-29 DIAGNOSIS — G9341 Metabolic encephalopathy: Secondary | ICD-10-CM | POA: Diagnosis not present

## 2022-09-29 DIAGNOSIS — M6281 Muscle weakness (generalized): Secondary | ICD-10-CM | POA: Diagnosis not present

## 2022-10-04 DIAGNOSIS — G20A1 Parkinson's disease without dyskinesia, without mention of fluctuations: Secondary | ICD-10-CM | POA: Diagnosis not present

## 2022-10-04 DIAGNOSIS — R279 Unspecified lack of coordination: Secondary | ICD-10-CM | POA: Diagnosis not present

## 2022-10-04 DIAGNOSIS — G9341 Metabolic encephalopathy: Secondary | ICD-10-CM | POA: Diagnosis not present

## 2022-10-04 DIAGNOSIS — F0390 Unspecified dementia without behavioral disturbance: Secondary | ICD-10-CM | POA: Diagnosis not present

## 2022-10-04 DIAGNOSIS — M6281 Muscle weakness (generalized): Secondary | ICD-10-CM | POA: Diagnosis not present

## 2022-10-05 DIAGNOSIS — G20A1 Parkinson's disease without dyskinesia, without mention of fluctuations: Secondary | ICD-10-CM | POA: Diagnosis not present

## 2022-10-05 DIAGNOSIS — F0390 Unspecified dementia without behavioral disturbance: Secondary | ICD-10-CM | POA: Diagnosis not present

## 2022-10-05 DIAGNOSIS — G9341 Metabolic encephalopathy: Secondary | ICD-10-CM | POA: Diagnosis not present

## 2022-10-05 DIAGNOSIS — R279 Unspecified lack of coordination: Secondary | ICD-10-CM | POA: Diagnosis not present

## 2022-10-05 DIAGNOSIS — M6281 Muscle weakness (generalized): Secondary | ICD-10-CM | POA: Diagnosis not present

## 2022-10-06 DIAGNOSIS — E441 Mild protein-calorie malnutrition: Secondary | ICD-10-CM | POA: Diagnosis not present

## 2022-10-06 DIAGNOSIS — G20B2 Parkinson's disease with dyskinesia, with fluctuations: Secondary | ICD-10-CM | POA: Diagnosis not present

## 2022-10-06 DIAGNOSIS — D649 Anemia, unspecified: Secondary | ICD-10-CM | POA: Diagnosis not present

## 2022-10-06 DIAGNOSIS — R5381 Other malaise: Secondary | ICD-10-CM | POA: Diagnosis not present

## 2022-10-09 DIAGNOSIS — G20A1 Parkinson's disease without dyskinesia, without mention of fluctuations: Secondary | ICD-10-CM | POA: Diagnosis not present

## 2022-10-09 DIAGNOSIS — M6281 Muscle weakness (generalized): Secondary | ICD-10-CM | POA: Diagnosis not present

## 2022-10-09 DIAGNOSIS — F0390 Unspecified dementia without behavioral disturbance: Secondary | ICD-10-CM | POA: Diagnosis not present

## 2022-10-09 DIAGNOSIS — G9341 Metabolic encephalopathy: Secondary | ICD-10-CM | POA: Diagnosis not present

## 2022-10-09 DIAGNOSIS — R279 Unspecified lack of coordination: Secondary | ICD-10-CM | POA: Diagnosis not present

## 2022-10-11 NOTE — Progress Notes (Signed)
Remote pacemaker transmission.   

## 2022-10-12 DIAGNOSIS — M6281 Muscle weakness (generalized): Secondary | ICD-10-CM | POA: Diagnosis not present

## 2022-10-12 DIAGNOSIS — G9341 Metabolic encephalopathy: Secondary | ICD-10-CM | POA: Diagnosis not present

## 2022-10-12 DIAGNOSIS — G20A1 Parkinson's disease without dyskinesia, without mention of fluctuations: Secondary | ICD-10-CM | POA: Diagnosis not present

## 2022-10-12 DIAGNOSIS — F0390 Unspecified dementia without behavioral disturbance: Secondary | ICD-10-CM | POA: Diagnosis not present

## 2022-10-12 DIAGNOSIS — R279 Unspecified lack of coordination: Secondary | ICD-10-CM | POA: Diagnosis not present

## 2022-10-16 DIAGNOSIS — R279 Unspecified lack of coordination: Secondary | ICD-10-CM | POA: Diagnosis not present

## 2022-10-16 DIAGNOSIS — G9341 Metabolic encephalopathy: Secondary | ICD-10-CM | POA: Diagnosis not present

## 2022-10-16 DIAGNOSIS — M6281 Muscle weakness (generalized): Secondary | ICD-10-CM | POA: Diagnosis not present

## 2022-10-16 DIAGNOSIS — F0390 Unspecified dementia without behavioral disturbance: Secondary | ICD-10-CM | POA: Diagnosis not present

## 2022-10-16 DIAGNOSIS — G20A1 Parkinson's disease without dyskinesia, without mention of fluctuations: Secondary | ICD-10-CM | POA: Diagnosis not present

## 2022-10-19 DIAGNOSIS — G20A1 Parkinson's disease without dyskinesia, without mention of fluctuations: Secondary | ICD-10-CM | POA: Diagnosis not present

## 2022-10-19 DIAGNOSIS — G9341 Metabolic encephalopathy: Secondary | ICD-10-CM | POA: Diagnosis not present

## 2022-10-19 DIAGNOSIS — F0390 Unspecified dementia without behavioral disturbance: Secondary | ICD-10-CM | POA: Diagnosis not present

## 2022-10-19 DIAGNOSIS — M6281 Muscle weakness (generalized): Secondary | ICD-10-CM | POA: Diagnosis not present

## 2022-10-19 DIAGNOSIS — R279 Unspecified lack of coordination: Secondary | ICD-10-CM | POA: Diagnosis not present

## 2022-10-20 DIAGNOSIS — M6281 Muscle weakness (generalized): Secondary | ICD-10-CM | POA: Diagnosis not present

## 2022-10-20 DIAGNOSIS — F0390 Unspecified dementia without behavioral disturbance: Secondary | ICD-10-CM | POA: Diagnosis not present

## 2022-10-20 DIAGNOSIS — G9341 Metabolic encephalopathy: Secondary | ICD-10-CM | POA: Diagnosis not present

## 2022-10-20 DIAGNOSIS — R279 Unspecified lack of coordination: Secondary | ICD-10-CM | POA: Diagnosis not present

## 2022-10-20 DIAGNOSIS — G20A1 Parkinson's disease without dyskinesia, without mention of fluctuations: Secondary | ICD-10-CM | POA: Diagnosis not present

## 2022-10-21 DIAGNOSIS — G20A1 Parkinson's disease without dyskinesia, without mention of fluctuations: Secondary | ICD-10-CM | POA: Diagnosis not present

## 2022-10-21 DIAGNOSIS — M6281 Muscle weakness (generalized): Secondary | ICD-10-CM | POA: Diagnosis not present

## 2022-10-21 DIAGNOSIS — R279 Unspecified lack of coordination: Secondary | ICD-10-CM | POA: Diagnosis not present

## 2022-10-21 DIAGNOSIS — F0390 Unspecified dementia without behavioral disturbance: Secondary | ICD-10-CM | POA: Diagnosis not present

## 2022-10-21 DIAGNOSIS — G9341 Metabolic encephalopathy: Secondary | ICD-10-CM | POA: Diagnosis not present

## 2022-10-23 DIAGNOSIS — M6281 Muscle weakness (generalized): Secondary | ICD-10-CM | POA: Diagnosis not present

## 2022-10-23 DIAGNOSIS — R279 Unspecified lack of coordination: Secondary | ICD-10-CM | POA: Diagnosis not present

## 2022-10-23 DIAGNOSIS — F0390 Unspecified dementia without behavioral disturbance: Secondary | ICD-10-CM | POA: Diagnosis not present

## 2022-10-23 DIAGNOSIS — G9341 Metabolic encephalopathy: Secondary | ICD-10-CM | POA: Diagnosis not present

## 2022-10-23 DIAGNOSIS — G20A1 Parkinson's disease without dyskinesia, without mention of fluctuations: Secondary | ICD-10-CM | POA: Diagnosis not present

## 2022-10-24 DIAGNOSIS — R279 Unspecified lack of coordination: Secondary | ICD-10-CM | POA: Diagnosis not present

## 2022-10-24 DIAGNOSIS — M6281 Muscle weakness (generalized): Secondary | ICD-10-CM | POA: Diagnosis not present

## 2022-10-24 DIAGNOSIS — F0390 Unspecified dementia without behavioral disturbance: Secondary | ICD-10-CM | POA: Diagnosis not present

## 2022-10-24 DIAGNOSIS — G9341 Metabolic encephalopathy: Secondary | ICD-10-CM | POA: Diagnosis not present

## 2022-10-24 DIAGNOSIS — G20A1 Parkinson's disease without dyskinesia, without mention of fluctuations: Secondary | ICD-10-CM | POA: Diagnosis not present

## 2022-10-26 DIAGNOSIS — F5105 Insomnia due to other mental disorder: Secondary | ICD-10-CM | POA: Diagnosis not present

## 2022-10-26 DIAGNOSIS — F413 Other mixed anxiety disorders: Secondary | ICD-10-CM | POA: Diagnosis not present

## 2022-10-26 DIAGNOSIS — F32A Depression, unspecified: Secondary | ICD-10-CM | POA: Diagnosis not present

## 2022-11-05 DIAGNOSIS — M2550 Pain in unspecified joint: Secondary | ICD-10-CM | POA: Diagnosis not present

## 2022-11-05 DIAGNOSIS — E1140 Type 2 diabetes mellitus with diabetic neuropathy, unspecified: Secondary | ICD-10-CM | POA: Diagnosis not present

## 2022-11-05 DIAGNOSIS — I119 Hypertensive heart disease without heart failure: Secondary | ICD-10-CM | POA: Diagnosis not present

## 2022-11-05 DIAGNOSIS — F339 Major depressive disorder, recurrent, unspecified: Secondary | ICD-10-CM | POA: Diagnosis not present

## 2022-12-05 ENCOUNTER — Ambulatory Visit (INDEPENDENT_AMBULATORY_CARE_PROVIDER_SITE_OTHER): Payer: Medicare Other

## 2022-12-05 DIAGNOSIS — I4891 Unspecified atrial fibrillation: Secondary | ICD-10-CM | POA: Diagnosis not present

## 2022-12-05 DIAGNOSIS — R5381 Other malaise: Secondary | ICD-10-CM | POA: Diagnosis not present

## 2022-12-05 DIAGNOSIS — I495 Sick sinus syndrome: Secondary | ICD-10-CM | POA: Diagnosis not present

## 2022-12-05 DIAGNOSIS — M810 Age-related osteoporosis without current pathological fracture: Secondary | ICD-10-CM | POA: Diagnosis not present

## 2022-12-05 DIAGNOSIS — N1831 Chronic kidney disease, stage 3a: Secondary | ICD-10-CM | POA: Diagnosis not present

## 2022-12-07 LAB — CUP PACEART REMOTE DEVICE CHECK
Battery Remaining Longevity: 91 mo
Battery Remaining Percentage: 91 %
Battery Voltage: 3.01 V
Date Time Interrogation Session: 20240530093738
Implantable Lead Connection Status: 753985
Implantable Lead Connection Status: 753985
Implantable Lead Implant Date: 20141226
Implantable Lead Implant Date: 20141226
Implantable Lead Location: 753858
Implantable Lead Location: 753860
Implantable Pulse Generator Implant Date: 20230329
Lead Channel Impedance Value: 350 Ohm
Lead Channel Impedance Value: 700 Ohm
Lead Channel Pacing Threshold Amplitude: 0.75 V
Lead Channel Pacing Threshold Amplitude: 1.125 V
Lead Channel Pacing Threshold Pulse Width: 0.4 ms
Lead Channel Pacing Threshold Pulse Width: 0.5 ms
Lead Channel Sensing Intrinsic Amplitude: 8.2 mV
Lead Channel Setting Pacing Amplitude: 2 V
Lead Channel Setting Pacing Amplitude: 2.125
Lead Channel Setting Pacing Pulse Width: 0.4 ms
Lead Channel Setting Pacing Pulse Width: 0.5 ms
Lead Channel Setting Sensing Sensitivity: 4 mV
Pulse Gen Model: 3562
Pulse Gen Serial Number: 8062428

## 2022-12-27 NOTE — Progress Notes (Signed)
Remote pacemaker transmission.   

## 2023-01-02 DIAGNOSIS — N1831 Chronic kidney disease, stage 3a: Secondary | ICD-10-CM | POA: Diagnosis not present

## 2023-01-02 DIAGNOSIS — R5381 Other malaise: Secondary | ICD-10-CM | POA: Diagnosis not present

## 2023-01-02 DIAGNOSIS — G20B2 Parkinson's disease with dyskinesia, with fluctuations: Secondary | ICD-10-CM | POA: Diagnosis not present

## 2023-01-02 DIAGNOSIS — E441 Mild protein-calorie malnutrition: Secondary | ICD-10-CM | POA: Diagnosis not present

## 2023-01-08 DEATH — deceased

## 2023-03-06 ENCOUNTER — Ambulatory Visit: Payer: Medicare Other
